# Patient Record
Sex: Female | Born: 1940
Health system: Southern US, Community
[De-identification: ages and names within clinical notes are randomized; demographics above are authoritative.]

## PROBLEM LIST (undated history)

## (undated) DIAGNOSIS — K4190 Unilateral femoral hernia, without obstruction or gangrene, not specified as recurrent: Secondary | ICD-10-CM

## (undated) DIAGNOSIS — K567 Ileus, unspecified: Secondary | ICD-10-CM

## (undated) DIAGNOSIS — M419 Scoliosis, unspecified: Secondary | ICD-10-CM

## (undated) DIAGNOSIS — E785 Hyperlipidemia, unspecified: Secondary | ICD-10-CM

## (undated) DIAGNOSIS — A419 Sepsis, unspecified organism: Secondary | ICD-10-CM

## (undated) DIAGNOSIS — R7303 Prediabetes: Secondary | ICD-10-CM

## (undated) DIAGNOSIS — E559 Vitamin D deficiency, unspecified: Secondary | ICD-10-CM

## (undated) DIAGNOSIS — K5732 Diverticulitis of large intestine without perforation or abscess without bleeding: Secondary | ICD-10-CM

## (undated) DIAGNOSIS — M1990 Unspecified osteoarthritis, unspecified site: Secondary | ICD-10-CM

## (undated) HISTORY — PX: COLONOSCOPY: SHX174

## (undated) HISTORY — DX: Hyperlipidemia, unspecified: E78.5

## (undated) HISTORY — PX: BREAST SURGERY: SHX581

## (undated) HISTORY — DX: Unspecified osteoarthritis, unspecified site: M19.90

## (undated) HISTORY — DX: Diverticulitis of large intestine without perforation or abscess without bleeding: K57.32

## (undated) HISTORY — DX: Prediabetes: R73.03

## (undated) HISTORY — DX: Scoliosis, unspecified: M41.9

## (undated) HISTORY — PX: TONSILLECTOMY AND ADENOIDECTOMY: SUR1326

## (undated) HISTORY — DX: Vitamin D deficiency, unspecified: E55.9

## (undated) HISTORY — DX: Unilateral femoral hernia, without obstruction or gangrene, not specified as recurrent: K41.90

## (undated) HISTORY — PX: TUBAL LIGATION: SHX77

---
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
## 1999-04-30 ENCOUNTER — Encounter: Payer: Self-pay | Admitting: Internal Medicine

## 1999-04-30 ENCOUNTER — Ambulatory Visit (HOSPITAL_COMMUNITY): Admission: RE | Admit: 1999-04-30 | Discharge: 1999-04-30 | Payer: Self-pay | Admitting: Internal Medicine

## 1999-05-01 ENCOUNTER — Ambulatory Visit (HOSPITAL_COMMUNITY): Admission: RE | Admit: 1999-05-01 | Discharge: 1999-05-01 | Payer: Self-pay | Admitting: Internal Medicine

## 1999-05-01 ENCOUNTER — Encounter: Payer: Self-pay | Admitting: Internal Medicine

## 1999-06-26 ENCOUNTER — Ambulatory Visit (HOSPITAL_COMMUNITY): Admission: RE | Admit: 1999-06-26 | Discharge: 1999-06-26 | Payer: Self-pay | Admitting: Unknown Physician Specialty

## 2000-02-17 ENCOUNTER — Ambulatory Visit (HOSPITAL_COMMUNITY): Admission: RE | Admit: 2000-02-17 | Discharge: 2000-02-17 | Payer: Self-pay | Admitting: Internal Medicine

## 2000-02-17 ENCOUNTER — Encounter: Payer: Self-pay | Admitting: Internal Medicine

## 2001-02-03 ENCOUNTER — Ambulatory Visit (HOSPITAL_COMMUNITY): Admission: RE | Admit: 2001-02-03 | Discharge: 2001-02-03 | Payer: Self-pay | Admitting: Internal Medicine

## 2001-02-03 ENCOUNTER — Encounter: Payer: Self-pay | Admitting: Internal Medicine

## 2001-02-21 ENCOUNTER — Encounter: Payer: Self-pay | Admitting: Neurosurgery

## 2001-02-21 ENCOUNTER — Ambulatory Visit (HOSPITAL_COMMUNITY): Admission: RE | Admit: 2001-02-21 | Discharge: 2001-02-21 | Payer: Self-pay | Admitting: Neurosurgery

## 2001-02-27 ENCOUNTER — Encounter: Payer: Self-pay | Admitting: Neurosurgery

## 2001-02-27 ENCOUNTER — Encounter: Admission: RE | Admit: 2001-02-27 | Discharge: 2001-02-27 | Payer: Self-pay | Admitting: Neurosurgery

## 2001-03-13 ENCOUNTER — Encounter: Payer: Self-pay | Admitting: Neurosurgery

## 2001-03-13 ENCOUNTER — Encounter: Admission: RE | Admit: 2001-03-13 | Discharge: 2001-03-13 | Payer: Self-pay | Admitting: Neurosurgery

## 2001-03-27 ENCOUNTER — Encounter: Admission: RE | Admit: 2001-03-27 | Discharge: 2001-03-27 | Payer: Self-pay | Admitting: Neurosurgery

## 2001-03-27 ENCOUNTER — Encounter: Payer: Self-pay | Admitting: Neurosurgery

## 2001-04-10 ENCOUNTER — Other Ambulatory Visit: Admission: RE | Admit: 2001-04-10 | Discharge: 2001-04-10 | Payer: Self-pay | Admitting: Internal Medicine

## 2001-04-14 ENCOUNTER — Encounter: Payer: Self-pay | Admitting: Internal Medicine

## 2001-04-14 ENCOUNTER — Ambulatory Visit (HOSPITAL_COMMUNITY): Admission: RE | Admit: 2001-04-14 | Discharge: 2001-04-14 | Payer: Self-pay | Admitting: Internal Medicine

## 2002-06-14 ENCOUNTER — Ambulatory Visit (HOSPITAL_COMMUNITY): Admission: RE | Admit: 2002-06-14 | Discharge: 2002-06-14 | Payer: Self-pay | Admitting: Internal Medicine

## 2002-06-14 ENCOUNTER — Encounter: Payer: Self-pay | Admitting: Internal Medicine

## 2003-08-23 ENCOUNTER — Ambulatory Visit (HOSPITAL_COMMUNITY): Admission: RE | Admit: 2003-08-23 | Discharge: 2003-08-23 | Payer: Self-pay | Admitting: Internal Medicine

## 2004-08-25 ENCOUNTER — Ambulatory Visit (HOSPITAL_COMMUNITY): Admission: RE | Admit: 2004-08-25 | Discharge: 2004-08-25 | Payer: Self-pay | Admitting: Internal Medicine

## 2005-08-26 ENCOUNTER — Other Ambulatory Visit: Admission: RE | Admit: 2005-08-26 | Discharge: 2005-08-26 | Payer: Self-pay | Admitting: Internal Medicine

## 2005-10-01 ENCOUNTER — Encounter: Admission: RE | Admit: 2005-10-01 | Discharge: 2005-10-01 | Payer: Self-pay | Admitting: Internal Medicine

## 2008-08-14 ENCOUNTER — Ambulatory Visit: Payer: Self-pay | Admitting: Internal Medicine

## 2008-08-27 ENCOUNTER — Ambulatory Visit: Payer: Self-pay | Admitting: Internal Medicine

## 2008-10-30 ENCOUNTER — Other Ambulatory Visit: Admission: RE | Admit: 2008-10-30 | Discharge: 2008-10-30 | Payer: Self-pay | Admitting: Internal Medicine

## 2009-01-27 ENCOUNTER — Ambulatory Visit (HOSPITAL_COMMUNITY): Admission: RE | Admit: 2009-01-27 | Discharge: 2009-01-27 | Payer: Self-pay | Admitting: Internal Medicine

## 2009-02-03 ENCOUNTER — Emergency Department (HOSPITAL_COMMUNITY): Admission: EM | Admit: 2009-02-03 | Discharge: 2009-02-03 | Payer: Self-pay | Admitting: Emergency Medicine

## 2009-02-12 ENCOUNTER — Ambulatory Visit: Payer: Self-pay | Admitting: Vascular Surgery

## 2009-02-12 ENCOUNTER — Encounter (INDEPENDENT_AMBULATORY_CARE_PROVIDER_SITE_OTHER): Payer: Self-pay | Admitting: Internal Medicine

## 2009-02-12 ENCOUNTER — Ambulatory Visit (HOSPITAL_COMMUNITY): Admission: RE | Admit: 2009-02-12 | Discharge: 2009-02-12 | Payer: Self-pay | Admitting: Internal Medicine

## 2010-05-28 ENCOUNTER — Encounter: Payer: Self-pay | Admitting: Cardiovascular Disease

## 2010-05-28 DIAGNOSIS — I6529 Occlusion and stenosis of unspecified carotid artery: Secondary | ICD-10-CM

## 2010-05-29 ENCOUNTER — Ambulatory Visit: Payer: Self-pay

## 2010-05-29 ENCOUNTER — Encounter: Payer: Self-pay | Admitting: Cardiovascular Disease

## 2010-11-10 NOTE — Miscellaneous (Signed)
Summary: Orders Update  Clinical Lists Changes  Problems: Added new problem of CAROTID STENOSIS (ICD-433.10) Orders: Added new Test order of Carotid Duplex (Carotid Duplex) - Signed 

## 2010-11-17 ENCOUNTER — Other Ambulatory Visit (HOSPITAL_COMMUNITY): Payer: Self-pay | Admitting: Family Medicine

## 2010-11-17 DIAGNOSIS — M1990 Unspecified osteoarthritis, unspecified site: Secondary | ICD-10-CM

## 2010-11-17 DIAGNOSIS — Z1231 Encounter for screening mammogram for malignant neoplasm of breast: Secondary | ICD-10-CM

## 2010-11-20 ENCOUNTER — Other Ambulatory Visit (HOSPITAL_COMMUNITY): Payer: Self-pay | Admitting: Internal Medicine

## 2010-11-20 DIAGNOSIS — M1990 Unspecified osteoarthritis, unspecified site: Secondary | ICD-10-CM

## 2010-11-23 ENCOUNTER — Ambulatory Visit (HOSPITAL_COMMUNITY)
Admission: RE | Admit: 2010-11-23 | Discharge: 2010-11-23 | Disposition: A | Discharge status: Home / Self Care | Payer: Medicare HMO | Source: Ambulatory Visit | Attending: Family Medicine | Admitting: Family Medicine

## 2010-11-23 DIAGNOSIS — Z13820 Encounter for screening for osteoporosis: Secondary | ICD-10-CM | POA: Insufficient documentation

## 2010-11-23 DIAGNOSIS — Z1231 Encounter for screening mammogram for malignant neoplasm of breast: Secondary | ICD-10-CM

## 2010-11-23 DIAGNOSIS — M1990 Unspecified osteoarthritis, unspecified site: Secondary | ICD-10-CM

## 2010-11-23 DIAGNOSIS — Z780 Asymptomatic menopausal state: Secondary | ICD-10-CM | POA: Insufficient documentation

## 2011-03-18 LAB — HM MAMMOGRAPHY

## 2011-12-16 DIAGNOSIS — N6019 Diffuse cystic mastopathy of unspecified breast: Secondary | ICD-10-CM | POA: Insufficient documentation

## 2013-09-16 ENCOUNTER — Encounter: Payer: Self-pay | Admitting: Physician Assistant

## 2013-09-18 ENCOUNTER — Ambulatory Visit (INDEPENDENT_AMBULATORY_CARE_PROVIDER_SITE_OTHER): Payer: Medicare HMO | Admitting: Physician Assistant

## 2013-09-18 ENCOUNTER — Encounter: Payer: Self-pay | Admitting: Physician Assistant

## 2013-09-18 VITALS — BP 110/70 | HR 82 | Temp 98.9°F | Resp 16 | Wt 103.9 lb

## 2013-09-18 DIAGNOSIS — I10 Essential (primary) hypertension: Secondary | ICD-10-CM

## 2013-09-18 DIAGNOSIS — E559 Vitamin D deficiency, unspecified: Secondary | ICD-10-CM

## 2013-09-18 DIAGNOSIS — R7309 Other abnormal glucose: Secondary | ICD-10-CM

## 2013-09-18 DIAGNOSIS — M791 Myalgia, unspecified site: Secondary | ICD-10-CM

## 2013-09-18 DIAGNOSIS — E785 Hyperlipidemia, unspecified: Secondary | ICD-10-CM

## 2013-09-18 DIAGNOSIS — R7303 Prediabetes: Secondary | ICD-10-CM

## 2013-09-18 LAB — BASIC METABOLIC PANEL WITH GFR
Calcium: 10.1 mg/dL (ref 8.4–10.5)
GFR, Est African American: >89 mL/min
Glucose, Bld: 91 mg/dL (ref 70–99)
Potassium: 4.6 mEq/L (ref 3.5–5.3)
Sodium: 139 mEq/L (ref 135–145)

## 2013-09-18 LAB — CBC WITH DIFFERENTIAL/PLATELET
Basophils Absolute: 0 10*3/uL (ref 0.0–0.1)
Basophils Relative: 1 % (ref 0–1)
Eosinophils Absolute: 0.2 10*3/uL (ref 0.0–0.7)
Eosinophils Relative: 6 % — ABNORMAL HIGH (ref 0–5)
HCT: 39.4 % (ref 36.0–46.0)
Lymphocytes Relative: 36 % (ref 12–46)
MCH: 29.1 pg (ref 26.0–34.0)
MCHC: 32.5 g/dL (ref 30.0–36.0)
MCV: 89.5 fL (ref 78.0–100.0)
Monocytes Absolute: 0.4 10*3/uL (ref 0.1–1.0)
Platelets: 282 10*3/uL (ref 150–400)
RDW: 13.8 % (ref 11.5–15.5)
WBC: 4 10*3/uL (ref 4.0–10.5)

## 2013-09-18 LAB — LIPID PANEL
Cholesterol: 221 mg/dL — ABNORMAL HIGH (ref 0–200)
Triglycerides: 49 mg/dL (ref <150)
VLDL: 10 mg/dL (ref 0–40)

## 2013-09-18 LAB — HEPATIC FUNCTION PANEL
ALT: 15 U/L (ref 0–35)
Albumin: 4.7 g/dL (ref 3.5–5.2)
Bilirubin, Direct: 0.1 mg/dL (ref 0.0–0.3)
Indirect Bilirubin: 0.3 mg/dL (ref 0.0–0.9)
Total Bilirubin: 0.4 mg/dL (ref 0.3–1.2)

## 2013-09-18 LAB — HEMOGLOBIN A1C
Hgb A1c MFr Bld: 5.6 % (ref <5.7)
Mean Plasma Glucose: 114 mg/dL (ref <117)

## 2013-09-18 LAB — CK: Total CK: 69 U/L (ref 7–177)

## 2013-09-18 NOTE — Patient Instructions (Signed)
Fat and Cholesterol Control Diet  Fat and cholesterol levels in your blood and organs are influenced by your diet. High levels of fat and cholesterol may lead to diseases of the heart, small and large blood vessels, gallbladder, liver, and pancreas.  CONTROLLING FAT AND CHOLESTEROL WITH DIET  Although exercise and lifestyle factors are important, your diet is key. That is because certain foods are known to raise cholesterol and others to lower it. The goal is to balance foods for their effect on cholesterol and more importantly, to replace saturated and trans fat with other types of fat, such as monounsaturated fat, polyunsaturated fat, and omega-3 fatty acids.  On average, a person should consume no more than 15 to 17 g of saturated fat daily. Saturated and trans fats are considered "bad" fats, and they will raise LDL cholesterol. Saturated fats are primarily found in animal products such as meats, butter, and cream. However, that does not mean you need to give up all your favorite foods. Today, there are good tasting, low-fat, low-cholesterol substitutes for most of the things you like to eat. Choose low-fat or nonfat alternatives. Choose round or loin cuts of red meat. These types of cuts are lowest in fat and cholesterol. Chicken (without the skin), fish, veal, and ground turkey breast are great choices. Eliminate fatty meats, such as hot dogs and salami. Even shellfish have little or no saturated fat. Have a 3 oz (85 g) portion when you eat lean meat, poultry, or fish.  Trans fats are also called "partially hydrogenated oils." They are oils that have been scientifically manipulated so that they are solid at room temperature resulting in a longer shelf life and improved taste and texture of foods in which they are added. Trans fats are found in stick margarine, some tub margarines, cookies, crackers, and baked goods.   When baking and cooking, oils are a great substitute for butter. The monounsaturated oils are  especially beneficial since it is believed they lower LDL and raise HDL. The oils you should avoid entirely are saturated tropical oils, such as coconut and palm.   Remember to eat a lot from food groups that are naturally free of saturated and trans fat, including fish, fruit, vegetables, beans, grains (barley, rice, couscous, bulgur wheat), and pasta (without cream sauces).   IDENTIFYING FOODS THAT LOWER FAT AND CHOLESTEROL   Soluble fiber may lower your cholesterol. This type of fiber is found in fruits such as apples, vegetables such as broccoli, potatoes, and carrots, legumes such as beans, peas, and lentils, and grains such as barley. Foods fortified with plant sterols (phytosterol) may also lower cholesterol. You should eat at least 2 g per day of these foods for a cholesterol lowering effect.   Read package labels to identify low-saturated fats, trans fat free, and low-fat foods at the supermarket. Select cheeses that have only 2 to 3 g saturated fat per ounce. Use a heart-healthy tub margarine that is free of trans fats or partially hydrogenated oil. When buying baked goods (cookies, crackers), avoid partially hydrogenated oils. Breads and muffins should be made from whole grains (whole-wheat or whole oat flour, instead of "flour" or "enriched flour"). Buy non-creamy canned soups with reduced salt and no added fats.   FOOD PREPARATION TECHNIQUES   Never deep-fry. If you must fry, either stir-fry, which uses very little fat, or use non-stick cooking sprays. When possible, broil, bake, or roast meats, and steam vegetables. Instead of putting butter or margarine on vegetables, use lemon   and herbs, applesauce, and cinnamon (for squash and sweet potatoes). Use nonfat yogurt, salsa, and low-fat dressings for salads.   LOW-SATURATED FAT / LOW-FAT FOOD SUBSTITUTES  Meats / Saturated Fat (g)  · Avoid: Steak, marbled (3 oz/85 g) / 11 g  · Choose: Steak, lean (3 oz/85 g) / 4 g  · Avoid: Hamburger (3 oz/85 g) / 7  g  · Choose: Hamburger, lean (3 oz/85 g) / 5 g  · Avoid: Ham (3 oz/85 g) / 6 g  · Choose: Ham, lean cut (3 oz/85 g) / 2.4 g  · Avoid: Chicken, with skin, dark meat (3 oz/85 g) / 4 g  · Choose: Chicken, skin removed, dark meat (3 oz/85 g) / 2 g  · Avoid: Chicken, with skin, light meat (3 oz/85 g) / 2.5 g  · Choose: Chicken, skin removed, light meat (3 oz/85 g) / 1 g  Dairy / Saturated Fat (g)  · Avoid: Whole milk (1 cup) / 5 g  · Choose: Low-fat milk, 2% (1 cup) / 3 g  · Choose: Low-fat milk, 1% (1 cup) / 1.5 g  · Choose: Skim milk (1 cup) / 0.3 g  · Avoid: Hard cheese (1 oz/28 g) / 6 g  · Choose: Skim milk cheese (1 oz/28 g) / 2 to 3 g  · Avoid: Cottage cheese, 4% fat (1 cup) / 6.5 g  · Choose: Low-fat cottage cheese, 1% fat (1 cup) / 1.5 g  · Avoid: Ice cream (1 cup) / 9 g  · Choose: Sherbet (1 cup) / 2.5 g  · Choose: Nonfat frozen yogurt (1 cup) / 0.3 g  · Choose: Frozen fruit bar / trace  · Avoid: Whipped cream (1 tbs) / 3.5 g  · Choose: Nondairy whipped topping (1 tbs) / 1 g  Condiments / Saturated Fat (g)  · Avoid: Mayonnaise (1 tbs) / 2 g  · Choose: Low-fat mayonnaise (1 tbs) / 1 g  · Avoid: Butter (1 tbs) / 7 g  · Choose: Extra light margarine (1 tbs) / 1 g  · Avoid: Coconut oil (1 tbs) / 11.8 g  · Choose: Olive oil (1 tbs) / 1.8 g  · Choose: Corn oil (1 tbs) / 1.7 g  · Choose: Safflower oil (1 tbs) / 1.2 g  · Choose: Sunflower oil (1 tbs) / 1.4 g  · Choose: Soybean oil (1 tbs) / 2.4 g  · Choose: Canola oil (1 tbs) / 1 g  Document Released: 09/27/2005 Document Revised: 01/22/2013 Document Reviewed: 03/18/2011  [employer]® Patient Information ©2014 [employer].

## 2013-09-18 NOTE — Progress Notes (Signed)
HPI Patient presents for 3 month follow up with hypertension, hyperlipidemia, prediabetes and vitamin D. Patient's blood pressure has been controlled at home. Patient denies chest pain, shortness of breath, dizziness.  Patient's cholesterol is diet controlled. The cholesterol last visit was LDL of 53 however she was on pravastatin at that time and is no longer one it due to myalgias. The patient has been working on diet and exercise for prediabetes, and denies changes in vision, polys, and paresthesias. A1C 5.9 Patient is on Vitamin D supplement.  Current Medications:  Current Outpatient Prescriptions on File Prior to Visit  Medication Sig Dispense Refill  . ALPRAZolam (XANAX XR) 0.5 MG 24 hr tablet Take 0.5 mg by mouth daily.      . Biotin 1 MG CAPS Take by mouth.      . cholecalciferol (VITAMIN D) 1000 UNITS tablet Take 6,000 Units by mouth daily.      Marland Kitchen co-enzyme Q-10 30 MG capsule Take 30 mg by mouth 3 (three) times daily.      Marland Kitchen aspirin 81 MG tablet Take 81 mg by mouth daily.      . pravastatin (PRAVACHOL) 40 MG tablet Take 40 mg by mouth daily.       No current facility-administered medications on file prior to visit.   Medical History:  Past Medical History  Diagnosis Date  . Hypertension   . Hyperlipidemia   . Arthritis   . Prediabetes   . Scoliosis   . Vitamin D deficiency    Allergies:  Allergies  Allergen Reactions  . Betadine [Povidone Iodine]   . Ciprocinonide [Fluocinolone]   . Flexeril [Cyclobenzaprine]   . Ibuprofen   . Naprosyn [Naproxen]   . Penicillins     REACTION: rash  . Shellfish Allergy   . Sodium Benzoate [Nutritional Supplements]   . Zyrtec [Cetirizine]     ROS Constitutional: Denies fever, chills, weight loss/gain, headaches, insomnia, fatigue, night sweats, and change in appetite. Eyes: Denies redness, blurred vision, diplopia, discharge, itchy, watery eyes.  ENT: Denies discharge, congestion, post nasal drip, sore throat, earache, dental pain,  Tinnitus, Vertigo, Sinus pain, snoring.  Cardio: Denies chest pain, palpitations, irregular heartbeat,  dyspnea, diaphoresis, orthopnea, PND, claudication, edema Respiratory: denies cough, dyspnea,pleurisy, hoarseness, wheezing.  Gastrointestinal: Denies dysphagia, heartburn,  water brash, pain, cramps, nausea, vomiting, bloating, diarrhea, constipation, hematemesis, melena, hematochezia,  hemorrhoids Genitourinary: Denies dysuria, frequency, urgency, nocturia, hesitancy, discharge, hematuria, flank pain Musculoskeletal: + myalgia mainly at night waking her up in the night, better since off statin. Denies arthralgia, stiffness, Jt. Swelling, pain, limp, and strain/sprain. Skin: Denies pruritis, rash, hives, warts, acne, eczema, changing in skin lesion Neuro: Denies Weakness, tremor, incoordination, spasms, paresthesia, pain Psychiatric: Denies confusion, memory loss, sensory loss Endocrine: Denies change in weight, skin, hair change, nocturia, and paresthesia, Diabetic Polys, Denies visual blurring, hyper /hypo glycemic episodes.  Heme/Lymph: Denies Excessive bleeding, bruising, enlarged lymph nodes  Family history- Review and unchanged Social history- Review and unchanged Physical Exam: Filed Vitals:   09/18/13 1400  BP: 110/70  Pulse: 82  Temp: 98.9 F (37.2 C)  Resp: 16   Filed Weights   09/18/13 1400  Weight: 103 lb 14.4 oz (47.129 kg)   General Appearance: Well nourished, in no apparent distress. Eyes: PERRLA, EOMs, conjunctiva no swelling or erythema, normal fundi and vessels. Sinuses: No Frontal/maxillary tenderness ENT/Mouth: Ext aud canals clear, with TMs without erythema, bulging.No erythema, swelling, or exudate on post pharynx.  Tonsils not swollen or erythematous. Hearing normal.  Neck: Supple, thyroid normal.  Respiratory: Respiratory effort normal, BS equal bilaterally without rales, rhonchi, wheezing or stridor.  Cardio: Heart sounds normal, regular rate and rhythm  without murmurs, rubs or gallops. Peripheral pulses brisk and equal bilaterally, without edema.  Abdomen: Flat, soft, with bowel sounds. Non tender, no guarding, rebound, hernias, masses, or organomegaly.  Lymphatics: Non tender without lymphadenopathy.  Musculoskeletal: Full ROM all peripheral extremities, joint stability, 5/5 strength, and normal gait. Skin: Warm, dry without rashes, lesions, ecchymosis.  Neuro: Cranial nerves intact, reflexes equal bilaterally. Normal muscle tone, no cerebellar symptoms. Sensation intact.  Psych: Awake and oriented X 3, normal affect, Insight and Judgment appropriate.   Assessment and Plan:  Hypertension: Continue medication, monitor blood pressure at home. Continue DASH diet. Cholesterol: Continue diet and exercise. Check cholesterol since off of statin Myalgia- check CPK and labs Pre-diabetes-Continue diet and exercise. Check A1C Vitamin D Def- check level and continue medications.   Quentin Mulling 2:16 PM

## 2013-09-19 LAB — VITAMIN D 25 HYDROXY (VIT D DEFICIENCY, FRACTURES): Vit D, 25-Hydroxy: 71 ng/mL (ref 30–89)

## 2013-10-15 ENCOUNTER — Ambulatory Visit (INDEPENDENT_AMBULATORY_CARE_PROVIDER_SITE_OTHER): Payer: Medicare HMO

## 2013-10-15 ENCOUNTER — Other Ambulatory Visit: Payer: Self-pay

## 2013-10-15 VITALS — BP 102/60 | HR 84 | Temp 98.3°F | Resp 16 | Ht 64.0 in | Wt 103.0 lb

## 2013-10-15 DIAGNOSIS — N3000 Acute cystitis without hematuria: Secondary | ICD-10-CM

## 2013-10-15 MED ORDER — NITROFURANTOIN MONOHYD MACRO 100 MG PO CAPS: 100.0000 mg | ORAL_CAPSULE | Freq: Two times a day (BID) | ORAL | Status: DC

## 2013-10-15 NOTE — Progress Notes (Signed)
Patient ID: Shelly Barrett, female   DOB: January 30, 1941, 73 y.o.   MRN: [id number] Patient here today with complaints of UTI symptoms.

## 2013-10-16 LAB — URINALYSIS, MICROSCOPIC ONLY
BACTERIA UA: NONE SEEN
CASTS: NONE SEEN
Squamous Epithelial / LPF: NONE SEEN

## 2013-10-16 LAB — URINALYSIS, ROUTINE W REFLEX MICROSCOPIC
Bilirubin Urine: NEGATIVE
Glucose, UA: 100 mg/dL — AB
NITRITE: POSITIVE — AB
PH: 5 (ref 5.0–8.0)
Protein, ur: NEGATIVE mg/dL
Specific Gravity, Urine: 1.018 (ref 1.005–1.030)
UROBILINOGEN UA: 1 mg/dL (ref 0.0–1.0)

## 2013-10-17 LAB — URINE CULTURE

## 2013-10-17 NOTE — Progress Notes (Signed)
Pt already has a appt 2/15 for cpe so will recheck urine then

## 2013-11-25 ENCOUNTER — Encounter: Payer: Self-pay | Admitting: Internal Medicine

## 2013-11-25 DIAGNOSIS — Z79899 Other long term (current) drug therapy: Secondary | ICD-10-CM | POA: Insufficient documentation

## 2013-11-25 NOTE — Patient Instructions (Signed)

## 2013-11-25 NOTE — Progress Notes (Signed)
Patient ID: Shelly Barrett, female   DOB: Aug 27, 1941, 73 y.o.   MRN: [id number]   Annual Screening Comprehensive Examination  This very nice 73 y.o. MWF presents for complete physical.  Patient has been followed for Labile HTN, Prediabetes, Hyperlipidemia, and Vitamin D Deficiency.    HTN predates since 2012 and has been monitored expectantly. Today's BP: 132/78 mmHg. Patient denies any cardiac symptoms as chest pain, palpitations, shortness of breath, dizziness or ankle swelling.   Patient's hyperlipidemia is controlled with diet but she stopped her Pravastatin about 6 months ago for concern that statins cause memory problems and dementia (as she is dealing with moderate dementia in her husband). Patient denied any myalgias or other medication SE's when taking the Pravastatin.  Lab Results  Component Value Date   CHOL 221* 09/18/2013   HDL 106 09/18/2013   LDLCALC 105* 09/18/2013   TRIG 49 09/18/2013   CHOLHDL 2.1 09/18/2013    Patient has prediabetes/insulin resistance with A1c 6.1% predating since Nov 2012 with last A1c of 5.9% in Sept 2014. Patient denies reactive hypoglycemic symptoms, visual blurring, diabetic polys, or paresthesias.    She does have DJD and DDD and moderately severe thoracolumbar scoliosis. In 2002 she had EDSI for L4L5 HNP and since then routinely sees a chiropractor for adjustments.   Finally, patient has history of Vitamin D Deficiency of 24 in 2008 with last vitamin D of 82 in Sept 2014.   Medication List         ALPRAZolam 0.5 MG 24 hr tablet      (takes rarely - only 1 or 2 X in the last year)  Commonly known as:  XANAX XR  Take 0.5 mg by mouth daily.     aspirin 81 MG tablet  Take 81 mg by mouth daily.     Biotin 1 MG Caps  Take by mouth.     cholecalciferol 1000 UNITS tablet  Commonly known as:  VITAMIN D  Take 6,000 Units by mouth daily.        OVER THE COUNTER MEDICATION  Prevagen (memory) 1 a day        Allergies  Allergen Reactions  .  Betadine [Povidone Iodine]   . Ciprocinonide [Fluocinolone]   . Flexeril [Cyclobenzaprine]   . Ibuprofen   . Naprosyn [Naproxen]   . Penicillins     REACTION: rash  . Shellfish Allergy   . Sodium Benzoate [Nutritional Supplements]   . Zyrtec [Cetirizine]     Past Medical History  Diagnosis Date  . Hyperlipidemia   . Arthritis   . Prediabetes   . Scoliosis   . Vitamin D deficiency     Past Surgical History  Procedure Laterality Date  . Breast surgery  1980, 2012    implants  . Tubal ligation    . Tonsillectomy and adenoidectomy      Family History  Problem Relation Age of Onset  . Heart disease Mother   . Hypertension Mother   . Heart disease Father   . Cancer Father     thoat and prostate  . Arthritis Sister     RA  . Hyperlipidemia Brother     History  Substance Use Topics  . Smoking status: Never Smoker   . Smokeless tobacco: Never Used  . Alcohol Use: 0.5 oz/week    1 drink(s) per week     Comment: occasionally    ROS Constitutional: Denies fever, chills, weight loss/gain, headaches, insomnia, fatigue, night sweats, and change in  appetite. Eyes: Denies redness, blurred vision, diplopia, discharge, itchy, watery eyes.  ENT: Denies discharge, congestion, post nasal drip, epistaxis, sore throat, earache, hearing loss, dental pain, Tinnitus, Vertigo, Sinus pain, snoring.  Cardio: Denies chest pain, palpitations, irregular heartbeat, syncope, dyspnea, diaphoresis, orthopnea, PND, claudication, edema Respiratory: denies cough, dyspnea, DOE, pleurisy, hoarseness, laryngitis, wheezing.  Gastrointestinal: Denies dysphagia, heartburn, reflux, water brash, pain, cramps, nausea, vomiting, bloating, diarrhea, constipation, hematemesis, melena, hematochezia, jaundice, hemorrhoids Genitourinary: Denies dysuria, frequency, urgency, nocturia, hesitancy, discharge, hematuria, flank pain Breast:Breast lumps, nipple discharge, bleeding.  Musculoskeletal: Denies arthralgia, ,  stiffness, Jt. Swelling, limp, and strain/sprain.Does c/o chronic LBP w/o sciatica.Generalized decrease in muscle power, tone and bulk consistent with age and deconditioning. Skin: Denies puritis, rash, hives, warts, acne, eczema, changing in skin lesion Neuro: No weakness, tremor, incoordination, spasms, paresthesia, pain Psychiatric: Denies confusion, memory loss, sensory loss Endocrine: Denies change in weight, skin, hair change, nocturia, and paresthesia, diabetic polys, visual blurring, hyper / hypo glycemic episodes.  Heme/Lymph: No excessive bleeding, bruising, enlarged lymph nodes.  BP: 132/78  Pulse: 76  Temp: 97.7 F (36.5 C)  Resp: 16    Estimated body mass index is 17.67 kg/(m^2) as calculated from the following:   Height as of this encounter: 5\' 4"  (1.626 m).   Weight as of this encounter: 103 lb (46.72 kg).  Physical Exam General Appearance: Well nourished, in no apparent distress. Eyes: PERRLA, EOMs, conjunctiva no swelling or erythema, normal fundi and vessels. Sinuses: No frontal/maxillary tenderness ENT/Mouth: EACs patent / TMs  nl. Nares clear without erythema, swelling, mucoid exudates. Oral hygiene is good. No erythema, swelling, or exudate. Tongue normal, non-obstructing. Tonsils not swollen or erythematous. Hearing normal.  Neck: Supple, thyroid normal. No bruits, nodes or JVD. Respiratory: Respiratory effort normal.  BS equal and clear bilateral without rales, rhonci, wheezing or stridor. Cardio: Heart sounds are normal with regular rate and rhythm and no murmurs, rubs or gallops. Peripheral pulses are normal and equal bilaterally without edema. No aortic or femoral bruits. Chest: Assymmetric with moderate S- shaped thoracolumbar scoliosis Breasts: Symmetric, without lumps, nipple discharge, retractions, or fibrocystic changes.  Abdomen: Flat, soft, with bowl sounds. Nontender, no guarding, rebound, hernias, masses, or organomegaly.  Lymphatics: Non tender without  lymphadenopathy.  Genitourinary:  Musculoskeletal: Full ROM all peripheral extremities, joint stability, 5/5 strength, and normal gait. Skin: Warm and dry without rashes, lesions, cyanosis, clubbing or  ecchymosis.  Neuro: Cranial nerves intact, reflexes equal bilaterally. Normal muscle tone, no cerebellar symptoms. Sensation intact.  Pysch: Awake and oriented X 3, normal affect, Insight and Judgment appropriate.   Assessment and Plan  1. Annual Screening Examination 2. Hypertension, Labile 3. Hyperlipidemia 4. Pre Diabetes 5. Vitamin D Deficiency 6. DJD/DDD/Chronic Lumbago 7. Thoracolumbar Scoliosis  Continue prudent diet as discussed, weight control, BP monitoring, regular exercise, and medications. Discussed med's effects and SE's. Screening labs and tests as requested with regular follow-up as recommended.

## 2013-11-26 ENCOUNTER — Ambulatory Visit (INDEPENDENT_AMBULATORY_CARE_PROVIDER_SITE_OTHER): Payer: Medicare HMO | Admitting: Internal Medicine

## 2013-11-26 ENCOUNTER — Encounter: Payer: Self-pay | Admitting: Internal Medicine

## 2013-11-26 VITALS — BP 132/78 | HR 76 | Temp 97.7°F | Resp 16 | Ht 64.0 in | Wt 103.0 lb

## 2013-11-26 DIAGNOSIS — E559 Vitamin D deficiency, unspecified: Secondary | ICD-10-CM

## 2013-11-26 DIAGNOSIS — R7303 Prediabetes: Secondary | ICD-10-CM

## 2013-11-26 DIAGNOSIS — E60 Dietary zinc deficiency: Secondary | ICD-10-CM

## 2013-11-26 DIAGNOSIS — E785 Hyperlipidemia, unspecified: Secondary | ICD-10-CM

## 2013-11-26 DIAGNOSIS — Z0000 Encounter for general adult medical examination without abnormal findings: Secondary | ICD-10-CM

## 2013-11-26 DIAGNOSIS — I10 Essential (primary) hypertension: Secondary | ICD-10-CM

## 2013-11-26 DIAGNOSIS — D649 Anemia, unspecified: Secondary | ICD-10-CM

## 2013-11-26 DIAGNOSIS — Z79899 Other long term (current) drug therapy: Secondary | ICD-10-CM

## 2013-11-26 DIAGNOSIS — Z1212 Encounter for screening for malignant neoplasm of rectum: Secondary | ICD-10-CM

## 2013-11-26 LAB — CBC WITH DIFFERENTIAL/PLATELET
Basophils Absolute: 0 K/uL (ref 0.0–0.1)
Basophils Relative: 1 % (ref 0–1)
Eosinophils Absolute: 0.1 K/uL (ref 0.0–0.7)
Eosinophils Relative: 4 % (ref 0–5)
HCT: 40.7 % (ref 36.0–46.0)
Hemoglobin: 13.4 g/dL (ref 12.0–15.0)
Lymphocytes Relative: 46 % (ref 12–46)
Lymphs Abs: 1.3 K/uL (ref 0.7–4.0)
MCH: 29.1 pg (ref 26.0–34.0)
MCHC: 32.9 g/dL (ref 30.0–36.0)
MCV: 88.3 fL (ref 78.0–100.0)
Monocytes Absolute: 0.3 K/uL (ref 0.1–1.0)
Monocytes Relative: 9 % (ref 3–12)
Neutro Abs: 1.1 K/uL (ref 1.7–7.7)
Neutrophils Relative %: 40 % — ABNORMAL LOW (ref 43–77)
Platelets: 274 K/uL (ref 150–400)
RBC: 4.61 MIL/uL (ref 3.87–5.11)
RDW: 14.5 % (ref 11.5–15.5)
WBC: 2.8 K/uL — ABNORMAL LOW (ref 4.0–10.5)

## 2013-11-26 LAB — HEMOGLOBIN A1C
HEMOGLOBIN A1C: 5.6 % (ref <5.7)
Mean Plasma Glucose: 114 mg/dL (ref <117)

## 2013-11-27 LAB — MAGNESIUM: MAGNESIUM: 2.2 mg/dL (ref 1.5–2.5)

## 2013-11-27 LAB — HEPATIC FUNCTION PANEL
ALBUMIN: 5.3 g/dL — AB (ref 3.5–5.2)
ALK PHOS: 91 U/L (ref 39–117)
ALT: 13 U/L (ref 0–35)
AST: 21 U/L (ref 0–37)
Bilirubin, Direct: 0.1 mg/dL (ref 0.0–0.3)
Indirect Bilirubin: 0.5 mg/dL (ref 0.2–1.2)
TOTAL PROTEIN: 7.5 g/dL (ref 6.0–8.3)
Total Bilirubin: 0.6 mg/dL (ref 0.2–1.2)

## 2013-11-27 LAB — BASIC METABOLIC PANEL WITH GFR
BUN: 11 mg/dL (ref 6–23)
CALCIUM: 9.8 mg/dL (ref 8.4–10.5)
CO2: 27 mEq/L (ref 19–32)
CREATININE: 0.44 mg/dL — AB (ref 0.50–1.10)
Chloride: 101 mEq/L (ref 96–112)
GFR, Est Non African American: >89 mL/min
Glucose, Bld: 91 mg/dL (ref 70–99)
Potassium: 3.9 mEq/L (ref 3.5–5.3)
SODIUM: 139 meq/L (ref 135–145)

## 2013-11-27 LAB — IRON AND TIBC
%SAT: 28 % (ref 20–55)
IRON: 122 ug/dL (ref 42–145)
TIBC: 434 ug/dL (ref 250–470)
UIBC: 312 ug/dL (ref 125–400)

## 2013-11-27 LAB — TSH: TSH: 1.279 u[IU]/mL (ref 0.350–4.500)

## 2013-11-27 LAB — URINALYSIS, MICROSCOPIC ONLY
Bacteria, UA: NONE SEEN
Casts: NONE SEEN
Squamous Epithelial / LPF: NONE SEEN

## 2013-11-27 LAB — MICROALBUMIN / CREATININE URINE RATIO
Creatinine, Urine: 47.3 mg/dL
MICROALB UR: 2.6 mg/dL — AB (ref 0.00–1.89)
MICROALB/CREAT RATIO: 55 mg/g — AB (ref 0.0–30.0)

## 2013-11-27 LAB — INSULIN, FASTING: Insulin fasting, serum: 4 u[IU]/mL (ref 3–28)

## 2013-11-27 LAB — VITAMIN D 25 HYDROXY (VIT D DEFICIENCY, FRACTURES): Vit D, 25-Hydroxy: 90 ng/mL — ABNORMAL HIGH (ref 30–89)

## 2013-11-27 LAB — LIPID PANEL
CHOLESTEROL: 221 mg/dL — AB (ref 0–200)
HDL: 100 mg/dL (ref >39)
LDL Cholesterol: 108 mg/dL — ABNORMAL HIGH (ref 0–99)
Total CHOL/HDL Ratio: 2.2 Ratio
Triglycerides: 64 mg/dL (ref <150)
VLDL: 13 mg/dL (ref 0–40)

## 2013-11-28 LAB — ZINC: Zinc: 81 ug/dL (ref 60–130)

## 2014-01-07 ENCOUNTER — Encounter: Payer: Self-pay | Admitting: Physician Assistant

## 2014-01-07 ENCOUNTER — Ambulatory Visit (INDEPENDENT_AMBULATORY_CARE_PROVIDER_SITE_OTHER): Payer: Commercial Managed Care - HMO | Admitting: Physician Assistant

## 2014-01-07 VITALS — BP 118/60 | HR 80 | Temp 99.5°F | Resp 16 | Wt 104.0 lb

## 2014-01-07 DIAGNOSIS — J0100 Acute maxillary sinusitis, unspecified: Secondary | ICD-10-CM

## 2014-01-07 MED ORDER — AZITHROMYCIN 250 MG PO TABS: ORAL_TABLET | ORAL | Status: DC

## 2014-01-07 MED ORDER — AZITHROMYCIN 250 MG PO TABS
ORAL_TABLET | ORAL | Status: DC
Start: 2014-01-07 — End: 2014-01-07

## 2014-01-07 NOTE — Progress Notes (Signed)
   Subjective:    Patient ID: Shelly Barrett, female    DOB: 01/20/1941, 73 y.o.   MRN: [id number]  Cough This is a recurrent problem. Episode onset: 2-3 weeks. The problem has been unchanged. The problem occurs constantly. Associated symptoms include chills, a fever, headaches, myalgias, nasal congestion, postnasal drip, rhinorrhea, a sore throat, shortness of breath and wheezing. Pertinent negatives include no chest pain, ear congestion, ear pain, heartburn, hemoptysis, rash, sweats or weight loss. She has tried rest (tylenol) for the symptoms. The treatment provided no relief.    Review of Systems  Constitutional: Positive for fever and chills. Negative for weight loss, diaphoresis, activity change and appetite change.  HENT: Positive for congestion, postnasal drip, rhinorrhea, sinus pressure and sore throat. Negative for ear pain, sneezing, tinnitus, trouble swallowing and voice change.   Eyes: Negative.   Respiratory: Positive for cough, chest tightness, shortness of breath and wheezing. Negative for hemoptysis.   Cardiovascular: Negative for chest pain, palpitations and leg swelling.  Gastrointestinal: Negative for heartburn.  Musculoskeletal: Positive for myalgias.  Skin: Negative for rash.  Neurological: Positive for headaches.       Objective:   Physical Exam  Constitutional: She appears well-developed and well-nourished.  HENT:  Head: Normocephalic and atraumatic.  Right Ear: External ear normal.  Left Ear: External ear normal.  Mouth/Throat: Uvula is midline and mucous membranes are normal. Posterior oropharyngeal edema and posterior oropharyngeal erythema present.  Eyes: Conjunctivae and EOM are normal. Pupils are equal, round, and reactive to light.  Neck: Normal range of motion. Neck supple.  Cardiovascular: Normal rate, regular rhythm and normal heart sounds.   Pulmonary/Chest: Effort normal and breath sounds normal.  Abdominal: Soft. Bowel sounds are normal.   Lymphadenopathy:    She has cervical adenopathy.  Skin: Skin is warm and dry.      Assessment & Plan:  Acute maxillary sinusitis - Plan: azithromycin (ZITHROMAX) 250 MG tablet, DISCONTINUED: azithromycin (ZITHROMAX) 250 MG tablet

## 2014-01-07 NOTE — Patient Instructions (Signed)

## 2014-03-07 ENCOUNTER — Ambulatory Visit (INDEPENDENT_AMBULATORY_CARE_PROVIDER_SITE_OTHER): Payer: Commercial Managed Care - HMO | Admitting: Internal Medicine

## 2014-03-07 ENCOUNTER — Encounter: Payer: Self-pay | Admitting: Internal Medicine

## 2014-03-07 VITALS — BP 122/72 | HR 88 | Temp 97.3°F | Resp 16 | Ht 64.75 in | Wt 105.2 lb

## 2014-03-07 DIAGNOSIS — S93409A Sprain of unspecified ligament of unspecified ankle, initial encounter: Secondary | ICD-10-CM

## 2014-03-07 NOTE — Patient Instructions (Signed)
Ankle Sprain An ankle sprain is an injury to the strong, fibrous tissues (ligaments) that hold the bones of your ankle joint together.  CAUSES An ankle sprain is usually caused by a fall or by twisting your ankle. Ankle sprains most commonly occur when you step on the outer edge of your foot, and your ankle turns inward. People who participate in sports are more prone to these types of injuries.  SYMPTOMS   Pain in your ankle. The pain may be present at rest or only when you are trying to stand or walk.  Swelling.  Bruising. Bruising may develop immediately or within 1 to 2 days after your injury.  Difficulty standing or walking, particularly when turning corners or changing directions. DIAGNOSIS  Your caregiver will ask you details about your injury and perform a physical exam of your ankle to determine if you have an ankle sprain. During the physical exam, your caregiver will press on and apply pressure to specific areas of your foot and ankle. Your caregiver will try to move your ankle in certain ways. An X-ray exam may be done to be sure a bone was not broken or a ligament did not separate from one of the bones in your ankle (avulsion fracture).  TREATMENT  Certain types of braces can help stabilize your ankle. Your caregiver can make a recommendation for this. Your caregiver may recommend the use of medicine for pain. If your sprain is severe, your caregiver may refer you to a surgeon who helps to restore function to parts of your skeletal system (orthopedist) or a physical therapist. HOME CARE INSTRUCTIONS   Apply ice to your injury for 1 2 days or as directed by your caregiver. Applying ice helps to reduce inflammation and pain.  Put ice in a plastic bag.  Place a towel between your skin and the bag.  Leave the ice on for 15-20 minutes at a time, every 2 hours while you are awake.  Only take over-the-counter or prescription medicines for pain, discomfort, or fever as directed by  your caregiver.  Elevate your injured ankle above the level of your heart as much as possible for 2 3 days.  If your caregiver recommends crutches, use them as instructed. Gradually put weight on the affected ankle. Continue to use crutches or a cane until you can walk without feeling pain in your ankle.  If you have a plaster splint, wear the splint as directed by your caregiver. Do not rest it on anything harder than a pillow for the first 24 hours. Do not put weight on it. Do not get it wet. You may take it off to take a shower or bath.  You may have been given an elastic bandage to wear around your ankle to provide support. If the elastic bandage is too tight (you have numbness or tingling in your foot or your foot becomes cold and blue), adjust the bandage to make it comfortable.  If you have an air splint, you may blow more air into it or let air out to make it more comfortable. You may take your splint off at night and before taking a shower or bath. Wiggle your toes in the splint several times per day to decrease swelling. SEEK MEDICAL CARE IF:   You have rapidly increasing bruising or swelling.  Your toes feel extremely cold or you lose feeling in your foot.  Your pain is not relieved with medicine. SEEK IMMEDIATE MEDICAL CARE IF:  Your toes are numb   or blue.  You have severe pain that is increasing. MAKE SURE YOU:   Understand these instructions.  Will watch your condition.  Will get help right away if you are not doing well or get worse. Document Released: 09/27/2005 Document Revised: 06/21/2012 Document Reviewed: 10/09/2011 [facility] Patient Information 2014 [place], [place].   Acute Ankle Sprain with Phase I Rehab An acute ankle sprain is a partial or complete tear in one or more of the ligaments of the ankle due to traumatic injury. The severity of the injury depends on both the the number of ligaments sprained and the grade of sprain. There are 3 grades of sprains.     A grade 1 sprain is a mild sprain. There is a slight pull without obvious tearing. There is no loss of strength, and the muscle and ligament are the correct length.  A grade 2 sprain is a moderate sprain. There is tearing of fibers within the substance of the ligament where it connects two bones or two cartilages. The length of the ligament is increased, and there is usually decreased strength.  A grade 3 sprain is a complete rupture of the ligament and is uncommon. In addition to the grade of sprain, there are three types of ankle sprains.  Lateral ankle sprains: This is a sprain of one or more of the three ligaments on the outer side (lateral) of the ankle. These are the most common sprains. Medial ankle sprains: There is one large triangular ligament of the inner side (medial) of the ankle that is susceptible to injury. Medial ankle sprains are less common. Syndesmosis, "high ankle," sprains: The syndesmosis is the ligament that connects the two bones of the lower leg. Syndesmosis sprains usually only occur with very severe ankle sprains. SYMPTOMS  Pain, tenderness, and swelling in the ankle, starting at the side of injury that may progress to the whole ankle and foot with time.  "Pop" or tearing sensation at the time of injury.  Bruising that may spread to the heel.  Impaired ability to walk soon after injury. CAUSES   Acute ankle sprains are caused by trauma placed on the ankle that temporarily forces or pries the anklebone (talus) out of its normal socket.  Stretching or tearing of the ligaments that normally hold the joint in place (usually due to a twisting injury). RISK INCREASES WITH:  Previous ankle sprain.  Sports in which the foot may land awkwardly (ie. basketball, volleyball, or soccer) or walking or running on uneven or rough surfaces.  Shoes with inadequate support to prevent sideways motion when stress occurs.  Poor strength and flexibility.  Poor balance  skills.  Contact sports. PREVENTION   Warm up and stretch properly before activity.  Maintain physical fitness:  Ankle and leg flexibility, muscle strength, and endurance.  Cardiovascular fitness.  Balance training activities.  Use proper technique and have a coach correct improper technique.  Taping, protective strapping, bracing, or high-top tennis shoes may help prevent injury. Initially, tape is best; however, it loses most of its support function within 10 to 15 minutes.  Wear proper fitted protective shoes (High-top shoes with taping or bracing is more effective than either alone).  Provide the ankle with support during sports and practice activities for 12 months following injury. PROGNOSIS   If treated properly, ankle sprains can be expected to recover completely; however, the length of recovery depends on the degree of injury.  A grade 1 sprain usually heals enough in 5 to 7 days to  allow modified activity and requires an average of 6 weeks to heal completely.  A grade 2 sprain requires 6 to 10 weeks to heal completely.  A grade 3 sprain requires 12 to 16 weeks to heal.  A syndesmosis sprain often takes more than 3 months to heal. RELATED COMPLICATIONS   Frequent recurrence of symptoms may result in a chronic problem. Appropriately addressing the problem the first time decreases the frequency of recurrence and optimizes healing time. Severity of the initial sprain does not predict the likelihood of later instability.  Injury to other structures (bone, cartilage, or tendon).  A chronically unstable or arthritic ankle joint is a possiblity with repeated sprains. TREATMENT Treatment initially involves the use of ice, medication, and compression bandages to help reduce pain and inflammation. Ankle sprains are usually immobilized in a walking cast or boot to allow for healing. Crutches may be recommended to reduce pressure on the injury. After immobilization,  strengthening and stretching exercises may be necessary to regain strength and a full range of motion. Surgery is rarely needed to treat ankle sprains. MEDICATION   Nonsteroidal anti-inflammatory medications, such as aspirin and ibuprofen (do not take for the first 3 days after injury or within 7 days before surgery), or other minor pain relievers, such as acetaminophen, are often recommended. Take these as directed by your caregiver. Contact your caregiver immediately if any bleeding, stomach upset, or signs of an allergic reaction occur from these medications.  Ointments applied to the skin may be helpful.  Pain relievers may be prescribed as necessary by your caregiver. Do not take prescription pain medication for longer than 4 to 7 days. Use only as directed and only as much as you need. HEAT AND COLD  Cold treatment (icing) is used to relieve pain and reduce inflammation for acute and chronic cases. Cold should be applied for 10 to 15 minutes every 2 to 3 hours for inflammation and pain and immediately after any activity that aggravates your symptoms. Use ice packs or an ice massage.  Heat treatment may be used before performing stretching and strengthening activities prescribed by your caregiver. Use a heat pack or a warm soak. SEEK IMMEDIATE MEDICAL CARE IF:   Pain, swelling, or bruising worsens despite treatment.  You experience pain, numbness, discoloration, or coldness in the foot or toes.  New, unexplained symptoms develop (drugs used in treatment may produce side effects.) EXERCISES  PHASE I EXERCISES RANGE OF MOTION (ROM) AND STRETCHING EXERCISES - Ankle Sprain, Acute Phase I, Weeks 1 to 2 These exercises may help you when beginning to restore flexibility in your ankle. You will likely work on these exercises for the 1 to 2 weeks after your injury. Once your physician, physical therapist, or athletic trainer sees adequate progress, he or she will advance your exercises. While  completing these exercises, remember:   Restoring tissue flexibility helps normal motion to return to the joints. This allows healthier, less painful movement and activity.  An effective stretch should be held for at least 30 seconds.  A stretch should never be painful. You should only feel a gentle lengthening or release in the stretched tissue. RANGE OF MOTION - Dorsi/Plantar Flexion  While sitting with your right / left knee straight, draw the top of your foot upwards by flexing your ankle. Then reverse the motion, pointing your toes downward.  Hold each position for __________ seconds.  After completing your first set of exercises, repeat this exercise with your knee bent. Repeat __________ times.  Complete this exercise __________ times per day.  RANGE OF MOTION - Ankle Alphabet  Imagine your right / left big toe is a pen.  Keeping your hip and knee still, write out the entire alphabet with your "pen." Make the letters as large as you can without increasing any discomfort. Repeat __________ times. Complete this exercise __________ times per day.  STRENGTHENING EXERCISES - Ankle Sprain, Acute -Phase I, Weeks 1 to 2 These exercises may help you when beginning to restore strength in your ankle. You will likely work on these exercises for 1 to 2 weeks after your injury. Once your physician, physical therapist, or athletic trainer sees adequate progress, he or she will advance your exercises. While completing these exercises, remember:   Muscles can gain both the endurance and the strength needed for everyday activities through controlled exercises.  Complete these exercises as instructed by your physician, physical therapist, or athletic trainer. Progress the resistance and repetitions only as guided.  You may experience muscle soreness or fatigue, but the pain or discomfort you are trying to eliminate should never worsen during these exercises. If this pain does worsen, stop and make  certain you are following the directions exactly. If the pain is still present after adjustments, discontinue the exercise until you can discuss the trouble with your clinician. STRENGTH - Dorsiflexors  Secure a rubber exercise band/tubing to a fixed object (ie. table, pole) and loop the other end around your right / left foot.  Sit on the floor facing the fixed object. The band/tubing should be slightly tense when your foot is relaxed.  Slowly draw your foot back toward you using your ankle and toes.  Hold this position for __________ seconds. Slowly release the tension in the band and return your foot to the starting position. Repeat __________ times. Complete this exercise __________ times per day.  STRENGTH - Plantar-flexors   Sit with your right / left leg extended. Holding onto both ends of a rubber exercise band/tubing, loop it around the ball of your foot. Keep a slight tension in the band.  Slowly push your toes away from you, pointing them downward.  Hold this position for __________ seconds. Return slowly, controlling the tension in the band/tubing. Repeat __________ times. Complete this exercise __________ times per day.  STRENGTH - Ankle Eversion  Secure one end of a rubber exercise band/tubing to a fixed object (table, pole). Loop the other end around your foot just before your toes.  Place your fists between your knees. This will focus your strengthening at your ankle.  Drawing the band/tubing across your opposite foot, slowly, pull your little toe out and up. Make sure the band/tubing is positioned to resist the entire motion.  Hold this position for __________ seconds. Have your muscles resist the band/tubing as it slowly pulls your foot back to the starting position.  Repeat __________ times. Complete this exercise __________ times per day.  STRENGTH - Ankle Inversion  Secure one end of a rubber exercise band/tubing to a fixed object (table, pole). Loop the other end  around your foot just before your toes.  Place your fists between your knees. This will focus your strengthening at your ankle.  Slowly, pull your big toe up and in, making sure the band/tubing is positioned to resist the entire motion.  Hold this position for __________ seconds.  Have your muscles resist the band/tubing as it slowly pulls your foot back to the starting position. Repeat __________ times. Complete this exercises __________ times  per day.  STRENGTH - Towel Curls  Sit in a chair positioned on a non-carpeted surface.  Place your right / left foot on a towel, keeping your heel on the floor.  Pull the towel toward your heel by only curling your toes. Keep your heel on the floor.  If instructed by your physician, physical therapist, or athletic trainer, add weight to the end of the towel. Repeat __________ times. Complete this exercise __________ times per day. Document Released: 04/28/2005 Document Revised: 12/20/2011 Document Reviewed: 01/09/2009 [facility] Patient Information 2014 [place], [place].

## 2014-03-07 NOTE — Progress Notes (Signed)
   Subjective:    Patient ID: Shelly Barrett, female    DOB: 09-15-1941, 73 y.o.   MRN: [id number]  Ankle Injury  The incident occurred more than 1 week ago. The incident occurred in the yard. The injury mechanism was an eversion injury. The pain is present in the left ankle. The quality of the pain is described as aching. The pain is mild. The pain has been fluctuating since onset. Associated symptoms include an inability to bear weight.     Medication List       This list is accurate as of: 03/07/14  8:20 PM.  Always use your most recent med list.               ALPRAZolam 0.5 MG 24 hr tablet  Commonly known as:  XANAX XR  Take 0.5 mg by mouth daily.     Biotin 1 MG Caps  Take by mouth.     cholecalciferol 1000 UNITS tablet  Commonly known as:  VITAMIN D  Take 6,000 Units by mouth daily.     OVER THE COUNTER MEDICATION  Prevagen (memory) 1 a day       Allergies  Allergen Reactions  . Betadine [Povidone Iodine]   . Ciprocinonide [Fluocinolone]   . Ciprofloxacin Hcl     Hives  . Flexeril [Cyclobenzaprine]   . Ibuprofen   . Naprosyn [Naproxen]   . Penicillins     REACTION: rash  . Shellfish Allergy   . Sodium Benzoate [Nutritional Supplements]   . Zyrtec [Cetirizine]    Past Medical History  Diagnosis Date  . Hyperlipidemia   . Arthritis   . Prediabetes   . Scoliosis   . Vitamin D deficiency    Review of Systems  Constitutional: Negative.   HENT: Negative.   Eyes: Negative.   Respiratory: Negative.   Cardiovascular: Negative.   Gastrointestinal: Negative.   Musculoskeletal: Positive for joint swelling.       C/o Swelling of Lt ankle & foot and painful with gait and weight bearing   BP 122/72  Pulse 88  Temp(Src) 97.3 F (36.3 C) (Temporal)  Resp 16  Ht 5' 4.75" (1.645 m)  Wt 105 lb 3.2 oz (47.718 kg)  BMI 17.63 kg/m2  Objective:   Physical Exam  Constitutional: She appears well-developed.  HENT:  Head: Atraumatic.  Eyes: EOM are normal.  Pupils are equal, round, and reactive to light.  Neck: Normal range of motion. Neck supple.  Cardiovascular: Normal rate, regular rhythm and normal heart sounds.   Pulmonary/Chest: Effort normal and breath sounds normal.  Abdominal: Soft.  Musculoskeletal: She exhibits tenderness.  Tender STS over the dorsolateral Rt ankle/foot  w/o obvious deformity   Assessment & Plan:   1. Sprain of ankle, unspecified site -Recc Ibuprofen 200mg  2-3 tabs w/food /daily as tolerated - Recc obtain a velcro or elastic ankle support/splint

## 2014-07-30 ENCOUNTER — Ambulatory Visit (INDEPENDENT_AMBULATORY_CARE_PROVIDER_SITE_OTHER): Payer: Commercial Managed Care - HMO

## 2014-07-30 VITALS — Temp 98.1°F

## 2014-07-30 DIAGNOSIS — Z23 Encounter for immunization: Secondary | ICD-10-CM

## 2014-09-18 ENCOUNTER — Ambulatory Visit (INDEPENDENT_AMBULATORY_CARE_PROVIDER_SITE_OTHER): Payer: Commercial Managed Care - HMO | Admitting: *Deleted

## 2014-09-18 DIAGNOSIS — Z23 Encounter for immunization: Secondary | ICD-10-CM

## 2014-11-11 ENCOUNTER — Ambulatory Visit: Payer: Self-pay | Admitting: Physician Assistant

## 2014-11-26 ENCOUNTER — Encounter: Payer: Self-pay | Admitting: Internal Medicine

## 2014-11-26 ENCOUNTER — Ambulatory Visit (INDEPENDENT_AMBULATORY_CARE_PROVIDER_SITE_OTHER): Payer: Commercial Managed Care - HMO | Admitting: Internal Medicine

## 2014-11-26 VITALS — BP 110/72 | HR 84 | Temp 97.9°F | Resp 16 | Ht 63.75 in | Wt 105.4 lb

## 2014-11-26 DIAGNOSIS — Z0000 Encounter for general adult medical examination without abnormal findings: Secondary | ICD-10-CM

## 2014-11-26 DIAGNOSIS — Z1212 Encounter for screening for malignant neoplasm of rectum: Secondary | ICD-10-CM

## 2014-11-26 DIAGNOSIS — J324 Chronic pansinusitis: Secondary | ICD-10-CM

## 2014-11-26 DIAGNOSIS — I10 Essential (primary) hypertension: Secondary | ICD-10-CM

## 2014-11-26 DIAGNOSIS — E559 Vitamin D deficiency, unspecified: Secondary | ICD-10-CM

## 2014-11-26 DIAGNOSIS — R7303 Prediabetes: Secondary | ICD-10-CM

## 2014-11-26 DIAGNOSIS — Z79899 Other long term (current) drug therapy: Secondary | ICD-10-CM

## 2014-11-26 DIAGNOSIS — M8580 Other specified disorders of bone density and structure, unspecified site: Secondary | ICD-10-CM

## 2014-11-26 DIAGNOSIS — Z1331 Encounter for screening for depression: Secondary | ICD-10-CM

## 2014-11-26 DIAGNOSIS — E785 Hyperlipidemia, unspecified: Secondary | ICD-10-CM

## 2014-11-26 DIAGNOSIS — R7309 Other abnormal glucose: Secondary | ICD-10-CM

## 2014-11-26 DIAGNOSIS — Z9181 History of falling: Secondary | ICD-10-CM

## 2014-11-26 DIAGNOSIS — R5383 Other fatigue: Secondary | ICD-10-CM

## 2014-11-26 LAB — CBC WITH DIFFERENTIAL/PLATELET
BASOS ABS: 0 10*3/uL (ref 0.0–0.1)
BASOS PCT: 1 % (ref 0–1)
EOS ABS: 0.2 10*3/uL (ref 0.0–0.7)
EOS PCT: 5 % (ref 0–5)
HCT: 40.3 % (ref 36.0–46.0)
Hemoglobin: 13.1 g/dL (ref 12.0–15.0)
Lymphocytes Relative: 38 % (ref 12–46)
Lymphs Abs: 1.3 10*3/uL (ref 0.7–4.0)
MCH: 29.5 pg (ref 26.0–34.0)
MCHC: 32.5 g/dL (ref 30.0–36.0)
MCV: 90.8 fL (ref 78.0–100.0)
MONO ABS: 0.3 10*3/uL (ref 0.1–1.0)
MPV: 9.8 fL (ref 8.6–12.4)
Monocytes Relative: 9 % (ref 3–12)
NEUTROS ABS: 1.6 10*3/uL — AB (ref 1.7–7.7)
Neutrophils Relative %: 47 % (ref 43–77)
PLATELETS: 286 10*3/uL (ref 150–400)
RBC: 4.44 MIL/uL (ref 3.87–5.11)
RDW: 13.7 % (ref 11.5–15.5)
WBC: 3.4 10*3/uL — AB (ref 4.0–10.5)

## 2014-11-26 LAB — HEMOGLOBIN A1C
HEMOGLOBIN A1C: 5.7 % — AB (ref <5.7)
MEAN PLASMA GLUCOSE: 117 mg/dL — AB (ref <117)

## 2014-11-26 MED ORDER — PREDNISONE 20 MG PO TABS: ORAL_TABLET | ORAL | Status: DC

## 2014-11-26 MED ORDER — AZITHROMYCIN 250 MG PO TABS: ORAL_TABLET | ORAL | Status: DC

## 2014-11-26 NOTE — Patient Instructions (Signed)

## 2014-11-26 NOTE — Progress Notes (Signed)
Patient ID: Shelly Barrett, female   DOB: 1941/09/27, 74 y.o.   MRN: [id number]  [facility] VISIT AND CPE  Assessment:   1. Essential hypertension  - Microalbumin / creatinine urine ratio - EKG 12-Lead - [place], RETROPERITNL ABD,  LTD  2. Hyperlipidemia  - Lipid panel  3. Prediabetes  - Hemoglobin A1c - Insulin, fasting  4. Vitamin D deficiency  - Vit D  25 hydroxy   5. Screening for rectal cancer  - POC Hemoccult Bld/Stl  6. Depression screen  - screen Negative  7. At low risk for fall  8. Medication management  - Urine Microscopic - CBC with Differential/Platelet - BASIC METABOLIC PANEL WITH GFR - Hepatic function panel - Magnesium  9. Other fatigue  - Iron and TIBC - TSH  10. Routine general medical examination at a health care facility   11. Pansinusitis  - predniSONE (DELTASONE) 20 MG tablet; 1 tab 3 x day for 3 days, then 1 tab 2 x day for 3 days, then 1 tab 1 x day for 5 days  Dispense: 20 tablet; Refill: 0 - azithromycin (ZITHROMAX) 250 MG tablet; Take 2 tablets (500 mg) on  Day 1,  followed by 1 tablet (250 mg) once daily on Days 2 through 5.  Dispense: 6 each; Refill: 1  12. Osteopenia  - DG Bone Density; Future   Plan:   During the course of the visit the patient was educated and counseled about appropriate screening and preventive services including:    Pneumococcal vaccine   Influenza vaccine  Td vaccine  Screening electrocardiogram  Bone densitometry screening  Colorectal cancer screening  Diabetes screening  Glaucoma screening  Nutrition counseling   Advanced directives: requested  Screening recommendations, referrals: Vaccinations:  Immunization History  Administered Date(s) Administered  . Influenza, High Dose Seasonal PF 07/30/2014  . Influenza-Unspecified 06/20/2013  . Pneumococcal Conjugate-13 09/18/2014  . Pneumococcal-Unspecified 09/16/2009  . Tdap 11/17/2010  . Zoster 09/16/2009   Hep B  vaccine not indicated  Nutrition assessed and recommended  Colonoscopy 08/2008 - Dr Leone Payor Recommended yearly ophthalmology/optometry visit for glaucoma screening and checkup Recommended yearly dental visit for hygiene and checkup Advanced directives - yes  Conditions/risks identified: BMI: Discussed weight loss, diet, and increase physical activity.  Increase physical activity: AHA recommends 150 minutes of physical activity a week.  Medications reviewed PreDiabetes is at goal, ACE/ARB therapy: Not Indicated Urinary Incontinence is not an issue: discussed non pharmacology and pharmacology options.  Fall risk: low- discussed PT, home fall assessment, medications.   Subjective:   Shelly Barrett is a 74 y.o. MWF who presents for Medicare Annual Wellness Visit and complete physical.  Date of last medicare wellness visit is unknown.  She has had labile elevated blood pressure since 2012 and has been monitored expectantly.Marland Kitchen Her blood pressure has been controlled at home, & today their BP is BP: 110/72 mmHg She does not workout. She denies chest pain, shortness of breath, dizziness.  She is not on cholesterol medication and denies myalgias. Her cholesterol is not at goal. The cholesterol last visit was:  Lab Results  Component Value Date   CHOL 221* 11/26/2013   HDL 100 11/26/2013   LDLCALC 108* 11/26/2013   TRIG 64 11/26/2013   CHOLHDL 2.2 11/26/2013   She has had prediabetes for 4 years (2012 with an elevated A1c 6.1%). She has not been working on diet and exercise for prediabetes, and denies foot ulcerations, hyperglycemia, hypoglycemia , increased appetite, paresthesia of  the feet, polydipsia, polyuria and visual disturbances. Last A1C in the office was:  Lab Results  Component Value Date   HGBA1C 5.6 11/26/2013   Patient is on Vitamin D supplement.   Lab Results  Component Value Date   VD25OH 90* 11/26/2013     Names of Other Physician/Practitioners you currently use: 1.  [facility] here for primary care 2. Dr Emily Filbert, eye doctor, last visit Nov 2015 3. Dr Sharma Covert, DDS, dentist, last visit Today  Patient Care Team: Lucky Cowboy, MD as PCP - General (Internal Medicine) Iva Boop, MD as Consulting Physician (Gastroenterology)  Medication Review: Medication Sig  . ALPRAZolam (XANAX XR) 0.5 MG 24 hr tablet Take 0.5 mg by mouth daily.  . Biotin 1 MG CAPS Take by mouth.  . cholecalciferol (VITAMIN D) 1000 UNITS tablet Take 6,000 Units by mouth daily.   Current Problems (verified) Patient Active Problem List   Diagnosis Date Noted  . Medication management 11/25/2013  . Hypertension   . Hyperlipidemia   . Prediabetes   . Vitamin D deficiency   . CAROTID STENOSIS 05/28/2010    Screening Tests Health Maintenance  Topic Date Due  . MAMMOGRAM  03/17/2013  . INFLUENZA VACCINE  05/12/2015  . COLONOSCOPY  10/17/2017  . TETANUS/TDAP  11/17/2020  . DEXA SCAN  Completed  . PNEUMOCOCCAL POLYSACCHARIDE VACCINE AGE 69 AND OVER  Completed  . ZOSTAVAX  Completed   Immunization History  Administered Date(s) Administered  . Influenza, High Dose Seasonal PF 07/30/2014  . Influenza-Unspecified 06/20/2013  . Pneumococcal Conjugate-13 09/18/2014  . Pneumococcal-Unspecified 09/16/2009  . Tdap 11/17/2010  . Zoster 09/16/2009   Preventative care: Last colonoscopy: 08/2008 - Dr Leone Payor  History reviewed: allergies, current medications, past family history, past medical history, past social history, past surgical history and problem list  Risk Factors: Tobacco History  Substance Use Topics  . Smoking status: Never Smoker   . Smokeless tobacco: Never Used  . Alcohol Use: 0.5 oz/week    1 drink(s) per week     Comment: occasionally   She does not smoke.  Patient is not a former smoker. Are there smokers in your home (other than you)?  No Alcohol Current alcohol use: glass of wine with dinner about 1  x/week  Caffeine Current caffeine use: denies use  Exercise Current exercise: housecleaning and yard work  Nutrition/Diet Current diet: in general, a "healthy" diet    Cardiac risk factors: advanced age (older than 41 for men, 7 for women), dyslipidemia, hypertension and sedentary lifestyle.  Depression Screen (Note: if answer to either of the following is "Yes", a more complete depression screening is indicated)   Q1: Over the past two weeks, have you felt down, depressed or hopeless? No  Q2: Over the past two weeks, have you felt little interest or pleasure in doing things? No  Have you lost interest or pleasure in daily life? No  Do you often feel hopeless? No  Do you cry easily over simple problems? No  Activities of Daily Living In your present state of health, do you have any difficulty performing the following activities?:  Driving? No Managing money?  No Feeding yourself? No Getting from bed to chair? No Climbing a flight of stairs? No Preparing food and eating?: No Bathing or showering? No Getting dressed: No Getting to the toilet? No Using the toilet:No Moving around from place to place: No In the past year have you fallen or had a near fall?:No  Are you sexually active?  Yes  Do you have more than one partner?  No  Vision Difficulties: No  Hearing Difficulties: No Do you often ask people to speak up or repeat themselves? No Do you experience ringing or noises in your ears? No Do you have difficulty understanding soft or whispered voices? Sometimes.  Cognition  Do you feel that you have a problem with memory?No  Do you often misplace items? No  Do you feel safe at home?  Yes  Advanced directives Does patient have a Health Care Power of Attorney? Yes Does patient have a Living Will? Yes  Past Medical History  Diagnosis Date  . Hyperlipidemia   . Arthritis   . Prediabetes   . Scoliosis   . Vitamin D deficiency    Past Surgical History   Procedure Laterality Date  . Breast surgery  1980, 2012    implants  . Tubal ligation    . Tonsillectomy and adenoidectomy     ROS: Constitutional: Denies fever, chills, weight loss/gain, headaches, insomnia, fatigue, night sweats, and change in appetite. Eyes: Denies redness, blurred vision, diplopia, discharge, itchy, watery eyes.  ENT: Denies discharge, congestion, post nasal drip, epistaxis, sore throat, earache, hearing loss, dental pain, Tinnitus, Vertigo, Sinus pain, snoring.  Cardio: Denies chest pain, palpitations, irregular heartbeat, syncope, dyspnea, diaphoresis, orthopnea, PND, claudication, edema Respiratory: denies cough, dyspnea, DOE, pleurisy, hoarseness, laryngitis, wheezing.  Gastrointestinal: Denies dysphagia, heartburn, reflux, water brash, pain, cramps, nausea, vomiting, bloating, diarrhea, constipation, hematemesis, melena, hematochezia, jaundice, hemorrhoids Genitourinary: Denies dysuria, frequency, urgency, nocturia, hesitancy, discharge, hematuria, flank pain Breast: Breast lumps, nipple discharge, bleeding.  Musculoskeletal: Denies arthralgia, myalgia, stiffness, Jt. Swelling, pain, limp, and strain/sprain. Denies falls. Skin: Denies puritis, rash, hives, warts, acne, eczema, changing in skin lesion Neuro: No weakness, tremor, incoordination, spasms, paresthesia, pain Psychiatric: Denies confusion, memory loss, sensory loss. Denies Depression. Endocrine: Denies change in weight, skin, hair change, nocturia, and paresthesia, diabetic polys, visual blurring, hyper / hypo glycemic episodes.  Heme/Lymph: No excessive bleeding, bruising, enlarged lymph nodes  Objective:     BP 110/72   Pulse 84  Tem 97.9 F  Resp 16  Ht 5' 3.75"   Wt 105 lb 6.4 oz     BMI 18.24   General Appearance: Well nourished, alert, WD/WN, female and in no apparent distress. Eyes: PERRLA, EOMs, conjunctiva no swelling or erythema, normal fundi and vessels. Sinuses: No frontal/maxillary  tenderness ENT/Mouth: EACs patent / TMs  nl. Nares clear without erythema, swelling, mucoid exudates. Oral hygiene is good. No erythema, swelling, or exudate. Tongue normal, non-obstructing. Tonsils not swollen or erythematous. Hearing normal.  Neck: Supple, thyroid normal. No bruits, nodes or JVD. Respiratory: Respiratory effort normal.  BS equal and clear bilateral without rales, rhonci, wheezing or stridor. Cardio: Heart sounds are normal with regular rate and rhythm and no murmurs, rubs or gallops. Peripheral pulses are normal and equal bilaterally without edema. No aortic or femoral bruits. Chest: symmetric with normal excursions and percussion. Breasts: Symmetric, without lumps, nipple discharge, retractions, or fibrocystic changes.  Abdomen: Flat, soft  with nl bowel sounds. Nontender, no guarding, rebound, hernias, masses, or organomegaly.  Lymphatics: Non tender without lymphadenopathy.  Musculoskeletal: Full ROM all peripheral extremities, joint stability, 5/5 strength, and normal gait. Skin: Warm and dry without rashes, lesions, cyanosis, clubbing or  ecchymosis.  Neuro: Cranial nerves intact, reflexes equal bilaterally. Normal muscle tone, no cerebellar symptoms. Sensation intact.  Pysch: Awake and oriented X 3, normal affect, Insight and Judgment  appropriate.   Cognitive Testing  Alert? Yes  Normal Appearance?Yes  Oriented to person? Yes  Place? Yes   Time? Yes  Recall of three objects?  Yes  Can perform simple calculations? Yes  Displays appropriate judgment? Yes  Can read the correct time from a watch/clock?Yes  Medicare Attestation I have personally reviewed: The patient's medical and social history Their use of alcohol, tobacco or illicit drugs Their current medications and supplements The patient's functional ability including ADLs,fall risks, home safety risks, cognitive, and hearing and visual impairment Diet and physical activities Evidence for depression or mood  disorders  The patient's weight, height, BMI, and visual acuity have been recorded in the chart.  I have made referrals, counseling, and provided education to the patient based on review of the above and I have provided the patient with a written personalized care plan for preventive services.    Shelly Boileau DAVID, MD   11/26/2014

## 2014-11-27 LAB — IRON AND TIBC
%SAT: 25 % (ref 20–55)
IRON: 104 ug/dL (ref 42–145)
TIBC: 410 ug/dL (ref 250–470)
UIBC: 306 ug/dL (ref 125–400)

## 2014-11-27 LAB — URINALYSIS, MICROSCOPIC ONLY
Bacteria, UA: NONE SEEN
CASTS: NONE SEEN
CRYSTALS: NONE SEEN
SQUAMOUS EPITHELIAL / LPF: NONE SEEN

## 2014-11-27 LAB — BASIC METABOLIC PANEL WITH GFR
BUN: 13 mg/dL (ref 6–23)
CALCIUM: 9.3 mg/dL (ref 8.4–10.5)
CO2: 27 meq/L (ref 19–32)
Chloride: 103 mEq/L (ref 96–112)
Creat: 0.56 mg/dL (ref 0.50–1.10)
GFR, Est African American: >89 mL/min
GFR, Est Non African American: >89 mL/min
Glucose, Bld: 77 mg/dL (ref 70–99)
Potassium: 3.7 mEq/L (ref 3.5–5.3)
SODIUM: 140 meq/L (ref 135–145)

## 2014-11-27 LAB — HEPATIC FUNCTION PANEL
ALK PHOS: 103 U/L (ref 39–117)
ALT: 16 U/L (ref 0–35)
AST: 21 U/L (ref 0–37)
Albumin: 4.6 g/dL (ref 3.5–5.2)
BILIRUBIN TOTAL: 0.4 mg/dL (ref 0.2–1.2)
Bilirubin, Direct: 0.1 mg/dL (ref 0.0–0.3)
Indirect Bilirubin: 0.3 mg/dL (ref 0.2–1.2)
Total Protein: 7 g/dL (ref 6.0–8.3)

## 2014-11-27 LAB — MICROALBUMIN / CREATININE URINE RATIO
CREATININE, URINE: 84.3 mg/dL
MICROALB UR: 4 mg/dL — AB (ref <2.0)
Microalb Creat Ratio: 47.4 mg/g — ABNORMAL HIGH (ref 0.0–30.0)

## 2014-11-27 LAB — INSULIN, FASTING: Insulin fasting, serum: 8.2 u[IU]/mL (ref 2.0–19.6)

## 2014-11-27 LAB — VITAMIN D 25 HYDROXY (VIT D DEFICIENCY, FRACTURES): VIT D 25 HYDROXY: 54 ng/mL (ref 30–100)

## 2014-11-27 LAB — LIPID PANEL
CHOLESTEROL: 197 mg/dL (ref 0–200)
HDL: 91 mg/dL (ref >39)
LDL Cholesterol: 92 mg/dL (ref 0–99)
Total CHOL/HDL Ratio: 2.2 Ratio
Triglycerides: 72 mg/dL (ref <150)
VLDL: 14 mg/dL (ref 0–40)

## 2014-11-27 LAB — MAGNESIUM: MAGNESIUM: 2.1 mg/dL (ref 1.5–2.5)

## 2014-11-27 LAB — TSH: TSH: 1.528 u[IU]/mL (ref 0.350–4.500)

## 2014-11-29 ENCOUNTER — Telehealth: Payer: Self-pay | Admitting: *Deleted

## 2014-11-29 NOTE — Telephone Encounter (Signed)
Patient requested her pharmacy be changed to [facility] on [address].

## 2014-12-18 ENCOUNTER — Other Ambulatory Visit: Payer: Self-pay | Admitting: Internal Medicine

## 2014-12-18 ENCOUNTER — Ambulatory Visit (HOSPITAL_COMMUNITY)
Admission: RE | Admit: 2014-12-18 | Discharge: 2014-12-18 | Disposition: A | Discharge status: Home / Self Care | Payer: Commercial Managed Care - HMO | Source: Ambulatory Visit | Attending: Internal Medicine | Admitting: Internal Medicine

## 2014-12-18 DIAGNOSIS — Z780 Asymptomatic menopausal state: Secondary | ICD-10-CM | POA: Insufficient documentation

## 2014-12-18 DIAGNOSIS — Z13820 Encounter for screening for osteoporosis: Secondary | ICD-10-CM | POA: Diagnosis present

## 2014-12-18 DIAGNOSIS — M8580 Other specified disorders of bone density and structure, unspecified site: Secondary | ICD-10-CM

## 2014-12-18 DIAGNOSIS — M810 Age-related osteoporosis without current pathological fracture: Secondary | ICD-10-CM

## 2014-12-18 MED ORDER — ALENDRONATE SODIUM 70 MG PO TABS: 70.0000 mg | ORAL_TABLET | ORAL | Status: DC

## 2015-06-25 ENCOUNTER — Ambulatory Visit (INDEPENDENT_AMBULATORY_CARE_PROVIDER_SITE_OTHER): Payer: Commercial Managed Care - HMO | Admitting: *Deleted

## 2015-06-25 DIAGNOSIS — Z23 Encounter for immunization: Secondary | ICD-10-CM

## 2015-07-31 ENCOUNTER — Ambulatory Visit (INDEPENDENT_AMBULATORY_CARE_PROVIDER_SITE_OTHER): Payer: Commercial Managed Care - HMO | Admitting: Internal Medicine

## 2015-07-31 ENCOUNTER — Encounter: Payer: Self-pay | Admitting: Internal Medicine

## 2015-07-31 VITALS — BP 118/56 | HR 80 | Temp 98.4°F | Resp 16 | Ht 63.75 in | Wt 99.0 lb

## 2015-07-31 DIAGNOSIS — J329 Chronic sinusitis, unspecified: Secondary | ICD-10-CM

## 2015-07-31 DIAGNOSIS — R634 Abnormal weight loss: Secondary | ICD-10-CM | POA: Diagnosis not present

## 2015-07-31 MED ORDER — MOMETASONE FUROATE 50 MCG/ACT NA SUSP: 2.0000 | Freq: Every day | NASAL | Status: DC

## 2015-07-31 MED ORDER — PREDNISONE 20 MG PO TABS: ORAL_TABLET | ORAL | Status: DC

## 2015-07-31 MED ORDER — DOXYCYCLINE HYCLATE 100 MG PO CAPS: 100.0000 mg | ORAL_CAPSULE | Freq: Two times a day (BID) | ORAL | Status: DC

## 2015-07-31 NOTE — Progress Notes (Signed)
Patient ID: Shelly Barrett, female   DOB: 1940-12-14, 74 y.o.   MRN: [id number]  HPI  Patient presents to the office for evaluation of cough.  It has been going on for 1 years.  Patient reports all the time.  They also endorse postnasal drip, sputum production and yellow nasal sputum, and cough secondary to mucuous in her throat.  .  They have tried nasal saline.  They report that nothing has worked.  They denies other sick contacts.  Patient also reports that she has had signficant weight loss over the past year.  She reports that she has had no night sweats.  She denies change in diet, however she states that some days she will eat a lot and then the next day she feels bad that she ate junk food and will not eat anything more than a boiled egg.  She is allergic to wey and refuses to try boost or ensure.  She is up to date on colonoscopy.  Most recent labs were normal.  She is not up to date on mammograms because she refuses to have them done as she is convinced that this is what ruptured her reconstructive implant.     Review of Systems  Constitutional: Positive for weight loss and malaise/fatigue. Negative for fever and chills.  HENT: Positive for congestion. Negative for ear pain and sore throat.        Post nasal drip  Eyes: Negative.   Respiratory: Negative for cough, shortness of breath and wheezing.   Cardiovascular: Negative for chest pain, palpitations and leg swelling.  Gastrointestinal: Negative for heartburn.  Neurological: Negative for headaches.    PE:  General:  Alert and non-toxic, WDWN, NAD HEENT: NCAT, PERLA, EOM normal, no occular discharge or erythema.  Nasal mucosal edema with sinus tenderness to palpation.  Oropharynx clear with minimal oropharyngeal edema and erythema.  Mucous membranes moist and pink. Neck:  Cervical adenopathy Chest:  RRR no MRGs.  Lungs clear to auscultation A&P with no wheezes rhonchi or rales.   Abdomen: +BS x 4 quadrants, soft, non-tender, no  guarding, rigidity, or rebound. Skin: warm and dry no rash Neuro: A&Ox4, CN II-XII grossly intact  Assessment and Plan:   1. Chronic sinusitis, unspecified location -try doxycycline 100 mg BID x 2 weeks -nasonex -prednisone -if no improvement send to ENT  2.  Weight loss -schedule for breast MRI  -all other screening exams up to date -recommended increasing calorie intake and increased snacking.

## 2015-07-31 NOTE — Patient Instructions (Signed)
Please take doxycycline twice daily with food to treat for any type of chronic sinus infection.  Please take the prednisone until it is gone as this will help decrease the swelling and inflammation in your nose.  Please use 2 sprays per nostril right before bedtime of the nasonex.  This will maintain the lack of swelling and mucous production in your nose.    If in 2-3 weeks it has not improved I will send you to see and Ear Nose and Throat specialist.    Please try to gradually increase your calorie intake.  You can eat healthy foods to do this, it doesn't need to be junk foods.

## 2015-08-04 ENCOUNTER — Other Ambulatory Visit: Payer: Self-pay | Admitting: Internal Medicine

## 2015-08-04 DIAGNOSIS — R634 Abnormal weight loss: Secondary | ICD-10-CM

## 2015-08-28 ENCOUNTER — Telehealth: Payer: Self-pay | Admitting: Internal Medicine

## 2015-08-28 ENCOUNTER — Encounter: Payer: Self-pay | Admitting: Internal Medicine

## 2015-08-28 ENCOUNTER — Ambulatory Visit (INDEPENDENT_AMBULATORY_CARE_PROVIDER_SITE_OTHER): Payer: Commercial Managed Care - HMO | Admitting: Internal Medicine

## 2015-08-28 ENCOUNTER — Other Ambulatory Visit: Payer: Self-pay

## 2015-08-28 VITALS — BP 116/64 | HR 72 | Temp 97.9°F | Resp 16 | Ht 63.75 in | Wt 102.2 lb

## 2015-08-28 DIAGNOSIS — N6011 Diffuse cystic mastopathy of right breast: Secondary | ICD-10-CM

## 2015-08-28 DIAGNOSIS — E785 Hyperlipidemia, unspecified: Secondary | ICD-10-CM

## 2015-08-28 DIAGNOSIS — I10 Essential (primary) hypertension: Secondary | ICD-10-CM | POA: Diagnosis not present

## 2015-08-28 DIAGNOSIS — N6019 Diffuse cystic mastopathy of unspecified breast: Secondary | ICD-10-CM

## 2015-08-28 DIAGNOSIS — N6012 Diffuse cystic mastopathy of left breast: Secondary | ICD-10-CM

## 2015-08-28 NOTE — Patient Instructions (Signed)
Mammogram A mammogram is an X-ray of the breasts that is done to check for abnormal changes. This procedure can screen for and detect any changes that may suggest breast cancer. A mammogram can also identify other changes and variations in the breast, such as:  Inflammation of the breast tissue (mastitis).  An infected area that contains a collection of pus (abscess).  A fluid-filled sac (cyst).  Fibrocystic changes. This is when breast tissue becomes denser, which can make the tissue feel rope-like or uneven under the skin.  Tumors that are not cancerous (benign). LET YOUR HEALTH CARE PROVIDER KNOW ABOUT:  Any allergies you have.  If you have breast implants.  If you have had previous breast disease, biopsy, or surgery.  If you are breastfeeding.  Any possibility that you could be pregnant, if this applies.  If you are younger than age 25.  If you have a family history of breast cancer. RISKS AND COMPLICATIONS Generally, this is a safe procedure. However, problems may occur, including:  Exposure to radiation. Radiation levels are very low with this test.  The results being misinterpreted.  The need for further tests.  The inability of the mammogram to detect certain cancers. BEFORE THE PROCEDURE  Schedule your test about 1-2 weeks after your menstrual period. This is usually when your breasts are the least tender.  If you have had a mammogram done at a different facility in the past, get the mammogram X-rays or have them sent to your current exam facility in order to compare them.  Wash your breasts and under your arms the day of the test.  Do not wear deodorants, perfumes, lotions, or powders anywhere on your body on the day of the test.  Remove any jewelry from your neck.  Wear clothes that you can change into and out of easily. PROCEDURE  You will undress from the waist up and put on a gown.  You will stand in front of the X-ray machine.  Each breast will  be placed between two plastic or glass plates. The plates will compress your breast for a few seconds. Try to stay as relaxed as possible during the procedure. This does not cause any harm to your breasts and any discomfort you feel will be very brief.  X-rays will be taken from different angles of each breast. The procedure may vary among health care providers and hospitals. AFTER THE PROCEDURE  The mammogram will be examined by a specialist (radiologist).  You may need to repeat certain parts of the test, depending on the quality of the images. This is commonly done if the radiologist needs a better view of the breast tissue.  Ask when your test results will be ready. Make sure you get your test results.  You may resume your normal activities.   This information is not intended to replace advice given to you by your health care provider. Make sure you discuss any questions you have with your health care provider.   Document Released: 09/24/2000 Document Revised: 06/18/2015 Document Reviewed: 12/06/2014 Elsevier Interactive Patient Education 2016 [employer]  

## 2015-08-28 NOTE — Telephone Encounter (Signed)
[facility] advised that Breast MRI protocol is to do MM first. Dr Melford Aase requested [place] discuss this  with patient for to address her concerns. Truman Hayward or Jeani Hawking at [facility] to contact patient to discuss.  Thank you, Leonie Douglas Referral Coordinator  [facility] [phone] ext. [phone] Fax [phone]

## 2015-08-28 NOTE — Progress Notes (Signed)
  Subjective:    Patient ID: Shelly Barrett, female    DOB: [birth year], 74 y.o.   MRN: [id number]  HPI   This very nice, but anxious 74 yo MWF with hx/o labile HTN, HLD has hx/o silicone gel implants from [place] and had her last MGM in 2012 when she showed very dense breast tissue and a rupture on the right and she underwent removal/replacement at [facility]. She was recently advised breast MRI which was approved, but she is very anxious and reluctant to proceed for concern & fear of a re-rupture and also concerns about a question of prior iodine induced allergies with hive after iv contrast media and lastly fear about lying still in a prone position for 45 minutes.  Apparently a friend researched her dilemma and has decided that she should have a breast U/S.    Patient was advised that in my opinion  an U/S alone would be insufficient to r/o early malignancy. Further to allay her fears of an allergic dye reaction that I would advise that she start Cetirizine several days before & continuing after the procedure and that also I would Rx Prednisone beginning the night before and for several days after the procedure.   Medication Sig  . ALPRAZolam (XANAX XR) 0.5 MG  Take 0.5 mg by mouth daily as needed.  . Biotin 1 MG CAPS Take by mouth.  Marland Kitchen VITAMIN D  Take 6,000 Units by mouth daily.  Marland Kitchen NASONEX  nasal spray Place 2 sprays into the nose daily.   Allergies  Allergen Reactions  . Betadine [Povidone Iodine]   . Ciprocinonide [Fluocinolone]   . Ciprofloxacin Hcl     Hives  . Flexeril [Cyclobenzaprine]   . Ibuprofen   . Naprosyn [Naproxen]   . Penicillins     REACTION: rash  . Shellfish Allergy   . Sodium Benzoate [Nutritional Supplements]   . Zyrtec [Cetirizine]    Past Medical History  Diagnosis Date  . Hyperlipidemia   . Arthritis   . Prediabetes   . Scoliosis   . Vitamin D deficiency    Past Surgical History  Procedure Laterality Date  . Breast surgery  1980, 2012    implants  . Tubal  ligation    . Tonsillectomy and adenoidectomy     Review of Systems  10 point systems review negative except as above.    Objective:   Physical Exam  BP 116/64 mmHg  Pulse 72  Temp(Src) 97.9 F (36.6 C)  Resp 16  Ht 5' 3.75" (1.619 m)  Wt 102 lb 3.2 oz (46.358 kg)  BMI 17.69 kg/m2  No formal exam was performed  As patient was in consultation for 40-45 minutes recounting (occasionally tearfully) her prior experiences with uncomfortable/painful MGM's, possible iodine allergy and fear of re-rupture.     Assessment & Plan:   1. Essential hypertension   2. Hyperlipidemia   3. Fibrocystic breast disease, unspecified laterality    Patient finally agreed to proceed with the MRI after the upcoming Thanksgiving holiday and then called back to the office with concerns about having a preliminary compression MGM before the MRI. She was advised to schedule a consultation with the radiologist to discuss her concerns.

## 2015-09-01 ENCOUNTER — Telehealth: Payer: Self-pay | Admitting: Internal Medicine

## 2015-09-01 NOTE — Telephone Encounter (Signed)
Dr Melford Aase requested that [place] speak to the patient about questions and concerns she has about a previous mammogram. They did speak with the patient and the patient agrees to have the mammogram first per radiology protocol.  Please enter an order for either a screening mammogram or a bilateral diagnostic mammogram, they said you can do either that you think is best since she has not had a mammogram since 2012.  Thank you, Shelly Barrett Referral Coordinator  [facility] [phone] ext. [phone] Fax [phone]

## 2015-09-08 ENCOUNTER — Other Ambulatory Visit: Payer: Self-pay | Admitting: Internal Medicine

## 2015-09-08 DIAGNOSIS — Z1239 Encounter for other screening for malignant neoplasm of breast: Secondary | ICD-10-CM

## 2015-09-15 ENCOUNTER — Encounter: Payer: Self-pay | Admitting: *Deleted

## 2015-09-15 ENCOUNTER — Ambulatory Visit (INDEPENDENT_AMBULATORY_CARE_PROVIDER_SITE_OTHER): Payer: Commercial Managed Care - HMO | Admitting: Physician Assistant

## 2015-09-15 VITALS — BP 122/70 | HR 92 | Temp 98.2°F | Resp 16 | Ht 63.75 in | Wt 98.0 lb

## 2015-09-15 DIAGNOSIS — R319 Hematuria, unspecified: Secondary | ICD-10-CM

## 2015-09-15 DIAGNOSIS — R300 Dysuria: Secondary | ICD-10-CM

## 2015-09-15 DIAGNOSIS — E860 Dehydration: Secondary | ICD-10-CM | POA: Diagnosis not present

## 2015-09-15 DIAGNOSIS — R109 Unspecified abdominal pain: Secondary | ICD-10-CM | POA: Diagnosis not present

## 2015-09-15 LAB — CBC WITH DIFFERENTIAL/PLATELET
BASOS ABS: 0.1 10*3/uL (ref 0.0–0.1)
Basophils Relative: 1 % (ref 0–1)
Eosinophils Absolute: 0.1 10*3/uL (ref 0.0–0.7)
Eosinophils Relative: 1 % (ref 0–5)
HEMATOCRIT: 38.9 % (ref 36.0–46.0)
HEMOGLOBIN: 12.6 g/dL (ref 12.0–15.0)
LYMPHS ABS: 1.7 10*3/uL (ref 0.7–4.0)
LYMPHS PCT: 28 % (ref 12–46)
MCH: 29.2 pg (ref 26.0–34.0)
MCHC: 32.4 g/dL (ref 30.0–36.0)
MCV: 90 fL (ref 78.0–100.0)
MONOS PCT: 8 % (ref 3–12)
MPV: 10.4 fL (ref 8.6–12.4)
Monocytes Absolute: 0.5 10*3/uL (ref 0.1–1.0)
NEUTROS ABS: 3.8 10*3/uL (ref 1.7–7.7)
NEUTROS PCT: 62 % (ref 43–77)
PLATELETS: 292 10*3/uL (ref 150–400)
RBC: 4.32 MIL/uL (ref 3.87–5.11)
RDW: 13.9 % (ref 11.5–15.5)
WBC: 6.1 10*3/uL (ref 4.0–10.5)

## 2015-09-15 LAB — HEPATIC FUNCTION PANEL
ALBUMIN: 4.4 g/dL (ref 3.6–5.1)
ALK PHOS: 92 U/L (ref 33–130)
ALT: 12 U/L (ref 6–29)
AST: 19 U/L (ref 10–35)
BILIRUBIN DIRECT: 0.1 mg/dL (ref <=0.2)
BILIRUBIN INDIRECT: 0.2 mg/dL (ref 0.2–1.2)
BILIRUBIN TOTAL: 0.3 mg/dL (ref 0.2–1.2)
Total Protein: 6.6 g/dL (ref 6.1–8.1)

## 2015-09-15 LAB — BASIC METABOLIC PANEL WITH GFR
BUN: 28 mg/dL — AB (ref 7–25)
CHLORIDE: 104 mmol/L (ref 98–110)
CO2: 29 mmol/L (ref 20–31)
Calcium: 9.4 mg/dL (ref 8.6–10.4)
Creat: 0.6 mg/dL (ref 0.60–0.93)
GFR, Est African American: >89 mL/min (ref >=60)
GFR, Est Non African American: >89 mL/min (ref >=60)
Glucose, Bld: 97 mg/dL (ref 65–99)
POTASSIUM: 4.5 mmol/L (ref 3.5–5.3)
Sodium: 140 mmol/L (ref 135–146)

## 2015-09-15 MED ORDER — SULFAMETHOXAZOLE-TRIMETHOPRIM 800-160 MG PO TABS: 1.0000 | ORAL_TABLET | Freq: Two times a day (BID) | ORAL | Status: DC

## 2015-09-15 NOTE — Progress Notes (Signed)
   Subjective:    Patient ID: Shelly Barrett, female    DOB: 05/31/1941, 74 y.o.   MRN: [id number]  HPI 74 y.o. non smoking WF presents with hematuria. She was seen in 10/20 for sinus infection, given doxycycline, states her sinuses improved but she had diarrhea from the ABX. She took imodium, which caused constipation, and took laxatives, ate bad chicken and has had diarrhea and vomiting 1 week.  She is no longer having vomiting but continues to have diarrhea. She has had some right upper and right flank. She started to see hematuria since yesterday. She has had chills without fever.  She states she has floating ribs, will see chiropractor that will put it back in and she  is afraid that her rib has "punctured" a lung. She has felt weak, dizzy with decreased appetite. She states she has been eating and drinking just not much. No NSAID use.   Blood pressure 122/70, pulse 92, temperature 98.2 F (36.8 C), temperature source Temporal, resp. rate 16, height 5' 3.75" (1.619 m), weight 98 lb (44.453 kg).  Current Outpatient Prescriptions on File Prior to Visit  Medication Sig Dispense Refill  . ALPRAZolam (XANAX XR) 0.5 MG 24 hr tablet Take 0.5 mg by mouth daily as needed.    . Biotin 1 MG CAPS Take by mouth.    . cholecalciferol (VITAMIN D) 1000 UNITS tablet Take 6,000 Units by mouth daily.     No current facility-administered medications on file prior to visit.   Past Medical History  Diagnosis Date  . Hyperlipidemia   . Arthritis   . Prediabetes   . Scoliosis   . Vitamin D deficiency     Review of Systems  Constitutional: Positive for chills, appetite change and fatigue. Negative for fever, diaphoresis and activity change.  HENT: Negative.  Negative for sore throat.   Respiratory: Negative.   Cardiovascular: Negative.   Gastrointestinal: Positive for vomiting and diarrhea. Negative for nausea, abdominal pain, constipation, blood in stool, abdominal distention, anal bleeding and rectal  pain.  Genitourinary: Positive for dysuria, urgency, frequency, hematuria and flank pain (right). Negative for decreased urine volume, vaginal bleeding, vaginal discharge, enuresis, difficulty urinating, genital sores, menstrual problem, pelvic pain and dyspareunia.  Musculoskeletal: Negative.  Negative for arthralgias.  Skin: Negative.  Negative for rash.       Objective:   Physical Exam  Constitutional: She is oriented to person, place, and time. She appears well-developed and well-nourished.  Cardiovascular: Normal rate and regular rhythm.   Pulmonary/Chest: Effort normal and breath sounds normal.  Abdominal: Soft. Bowel sounds are normal. She exhibits no distension and no mass. There is no tenderness. There is no rebound and no guarding.  + right CVA tenderness  Musculoskeletal: Normal range of motion.  + scoliosis  Neurological: She is alert and oriented to person, place, and time.  Skin: Skin is warm and dry. No rash noted.       Assessment & Plan:  Hematuria- benign AB- some right CVA tenderness ? UTI, pyelonephritis, stone, nephritic syndrome, mass Will check labs, get CT AB, allergic to contrast

## 2015-09-15 NOTE — Patient Instructions (Signed)

## 2015-09-16 LAB — URINALYSIS, ROUTINE W REFLEX MICROSCOPIC
BILIRUBIN URINE: NEGATIVE
GLUCOSE, UA: NEGATIVE
Nitrite: POSITIVE — AB
PH: 5 (ref 5.0–8.0)
SPECIFIC GRAVITY, URINE: 1.022 (ref 1.001–1.035)

## 2015-09-16 LAB — URINALYSIS, MICROSCOPIC ONLY
CASTS: NONE SEEN [LPF]
Squamous Epithelial / LPF: NONE SEEN [HPF] (ref <=5)
Yeast: NONE SEEN [HPF]

## 2015-09-16 LAB — URINE CULTURE
Colony Count: NO GROWTH
Organism ID, Bacteria: NO GROWTH

## 2015-09-19 ENCOUNTER — Ambulatory Visit
Admission: RE | Admit: 2015-09-19 | Discharge: 2015-09-19 | Disposition: A | Discharge status: Home / Self Care | Payer: Commercial Managed Care - HMO | Source: Ambulatory Visit | Attending: Physician Assistant | Admitting: Physician Assistant

## 2015-09-19 ENCOUNTER — Other Ambulatory Visit: Payer: Medicare HMO

## 2015-09-19 DIAGNOSIS — R109 Unspecified abdominal pain: Secondary | ICD-10-CM

## 2015-09-19 DIAGNOSIS — R319 Hematuria, unspecified: Secondary | ICD-10-CM

## 2015-09-24 ENCOUNTER — Other Ambulatory Visit: Payer: Self-pay | Admitting: Internal Medicine

## 2015-09-24 DIAGNOSIS — Z1231 Encounter for screening mammogram for malignant neoplasm of breast: Secondary | ICD-10-CM

## 2015-09-25 ENCOUNTER — Ambulatory Visit
Admission: RE | Admit: 2015-09-25 | Discharge: 2015-09-25 | Disposition: A | Discharge status: Home / Self Care | Payer: Commercial Managed Care - HMO | Source: Ambulatory Visit | Attending: Internal Medicine | Admitting: Internal Medicine

## 2015-09-25 DIAGNOSIS — Z1231 Encounter for screening mammogram for malignant neoplasm of breast: Secondary | ICD-10-CM

## 2015-09-29 ENCOUNTER — Ambulatory Visit: Payer: Self-pay

## 2015-09-29 ENCOUNTER — Ambulatory Visit (INDEPENDENT_AMBULATORY_CARE_PROVIDER_SITE_OTHER): Payer: Commercial Managed Care - HMO | Admitting: Internal Medicine

## 2015-09-29 ENCOUNTER — Encounter: Payer: Self-pay | Admitting: Internal Medicine

## 2015-09-29 VITALS — BP 124/60 | HR 80 | Temp 98.0°F | Resp 16 | Ht 63.75 in | Wt 100.0 lb

## 2015-09-29 DIAGNOSIS — N3001 Acute cystitis with hematuria: Secondary | ICD-10-CM | POA: Diagnosis not present

## 2015-09-29 DIAGNOSIS — H6121 Impacted cerumen, right ear: Secondary | ICD-10-CM

## 2015-09-29 NOTE — Progress Notes (Signed)
   Subjective:    Patient ID: Shelly Barrett, female    DOB: [birth year], 74 y.o.   MRN: [id number]  HPI  Patient here for recheck of urine from recent UTI.  She has finished abx and has minimal dysuria. Urine is back to normal color.  She reports that she has been having some minimal discomfort in the right side.  She reports that it is better after finishing the abx but still has it occasionally.  She also reports that she thinks that her glands are swollen in her neck.  CT abdomen reviewed and patient does appear to have evidence of bilateral kidney stones.  No obstruction was noted.  Patient reports that she is scheduled for her breast mri on 12/21 and she is doing it at [facility] office.  She is aware of the mammogram results and is concerned about another possible rupture.  Most recent silicone implants were placed in 2012.  She has had a previous rupture.   Review of Systems  Constitutional: Negative for fever, chills and fatigue.  HENT: Positive for ear pain.   Respiratory: Negative for cough, chest tightness, shortness of breath and wheezing.   Cardiovascular: Negative for chest pain and palpitations.  Gastrointestinal: Negative for nausea, vomiting, diarrhea, constipation and blood in stool.  Genitourinary: Positive for dysuria and flank pain. Negative for urgency and hematuria.       Objective:   Physical Exam  Constitutional: She is oriented to person, place, and time. She appears well-developed and well-nourished. No distress.  HENT:  Head: Normocephalic.  Mouth/Throat: Oropharynx is clear and moist. No oropharyngeal exudate.  Eyes: Conjunctivae are normal. No scleral icterus.  Neck: Normal range of motion. Neck supple. No JVD present. No thyromegaly present.  Cardiovascular: Normal rate, regular rhythm, normal heart sounds and intact distal pulses.  Exam reveals no gallop and no friction rub.   No murmur heard. Pulmonary/Chest: Effort normal and breath sounds normal. No  respiratory distress. She has no wheezes. She has no rales. She exhibits no tenderness.  Abdominal: Soft. Bowel sounds are normal. She exhibits no distension and no mass. There is no tenderness. There is no rebound, no guarding and no CVA tenderness.  Musculoskeletal: Normal range of motion.  Kyphosis and scoliosis present in thoracic spine.  Lymphadenopathy:    She has no cervical adenopathy.  Neurological: She is alert and oriented to person, place, and time.  Skin: Skin is warm and dry. She is not diaphoretic.  Psychiatric: She has a normal mood and affect. Her behavior is normal. Judgment and thought content normal.  Nursing note and vitals reviewed.   Filed Vitals:   09/29/15 1046  BP: 124/60  Pulse: 80  Temp: 98 F (36.7 C)  Resp: 16             Assessment & Plan:    1. Acute cystitis with hematuria -abx finished -recheck urine -neprholithiasis also possible culprit of hematuria  2. Mammogram results -discussed results and getting MRI -if ruptured needs to go see plastics

## 2015-09-29 NOTE — Addendum Note (Signed)
Addended by: Cagney Degrace A on: 09/29/2015 03:54 PM   Modules accepted: Orders

## 2015-09-30 LAB — URINALYSIS, MICROSCOPIC ONLY
BACTERIA UA: NONE SEEN [HPF]
CASTS: NONE SEEN [LPF]
RBC / HPF: NONE SEEN RBC/HPF (ref <=2)
SQUAMOUS EPITHELIAL / LPF: NONE SEEN [HPF] (ref <=5)
Yeast: NONE SEEN [HPF]

## 2015-09-30 LAB — URINALYSIS, ROUTINE W REFLEX MICROSCOPIC
Bilirubin Urine: NEGATIVE
Glucose, UA: NEGATIVE
Ketones, ur: NEGATIVE
LEUKOCYTES UA: NEGATIVE
NITRITE: NEGATIVE
PH: 5.5 (ref 5.0–8.0)
Protein, ur: NEGATIVE
SPECIFIC GRAVITY, URINE: 1.02 (ref 1.001–1.035)

## 2015-09-30 LAB — URINE CULTURE
Colony Count: NO GROWTH
Organism ID, Bacteria: NO GROWTH

## 2015-10-01 ENCOUNTER — Other Ambulatory Visit: Payer: Commercial Managed Care - HMO

## 2015-10-01 ENCOUNTER — Ambulatory Visit
Admission: RE | Admit: 2015-10-01 | Discharge: 2015-10-01 | Disposition: A | Discharge status: Home / Self Care | Payer: Commercial Managed Care - HMO | Source: Ambulatory Visit | Attending: Internal Medicine | Admitting: Internal Medicine

## 2015-10-01 DIAGNOSIS — R634 Abnormal weight loss: Secondary | ICD-10-CM

## 2015-10-01 MED ORDER — GADOBENATE DIMEGLUMINE 529 MG/ML IV SOLN
9.0000 mL | Freq: Once | INTRAVENOUS | Status: AC | PRN
  Administered 2015-10-01: 9 mL via INTRAVENOUS

## 2015-12-11 ENCOUNTER — Ambulatory Visit (INDEPENDENT_AMBULATORY_CARE_PROVIDER_SITE_OTHER): Payer: PPO | Admitting: Internal Medicine

## 2015-12-11 ENCOUNTER — Encounter: Payer: Self-pay | Admitting: Internal Medicine

## 2015-12-11 VITALS — BP 118/72 | HR 72 | Temp 97.7°F | Resp 16 | Ht 63.75 in | Wt 96.8 lb

## 2015-12-11 DIAGNOSIS — Z1212 Encounter for screening for malignant neoplasm of rectum: Secondary | ICD-10-CM

## 2015-12-11 DIAGNOSIS — Z0001 Encounter for general adult medical examination with abnormal findings: Secondary | ICD-10-CM

## 2015-12-11 DIAGNOSIS — R7303 Prediabetes: Secondary | ICD-10-CM | POA: Diagnosis not present

## 2015-12-11 DIAGNOSIS — D518 Other vitamin B12 deficiency anemias: Secondary | ICD-10-CM | POA: Diagnosis not present

## 2015-12-11 DIAGNOSIS — Z79899 Other long term (current) drug therapy: Secondary | ICD-10-CM | POA: Diagnosis not present

## 2015-12-11 DIAGNOSIS — D509 Iron deficiency anemia, unspecified: Secondary | ICD-10-CM

## 2015-12-11 DIAGNOSIS — E559 Vitamin D deficiency, unspecified: Secondary | ICD-10-CM | POA: Diagnosis not present

## 2015-12-11 DIAGNOSIS — I10 Essential (primary) hypertension: Secondary | ICD-10-CM | POA: Diagnosis not present

## 2015-12-11 DIAGNOSIS — E785 Hyperlipidemia, unspecified: Secondary | ICD-10-CM | POA: Diagnosis not present

## 2015-12-11 DIAGNOSIS — D519 Vitamin B12 deficiency anemia, unspecified: Secondary | ICD-10-CM | POA: Diagnosis not present

## 2015-12-11 DIAGNOSIS — R7309 Other abnormal glucose: Secondary | ICD-10-CM | POA: Diagnosis not present

## 2015-12-11 DIAGNOSIS — R6889 Other general symptoms and signs: Secondary | ICD-10-CM | POA: Diagnosis not present

## 2015-12-11 LAB — CBC WITH DIFFERENTIAL/PLATELET
BASOS ABS: 0 10*3/uL (ref 0.0–0.1)
Basophils Relative: 1 % (ref 0–1)
EOS ABS: 0.1 10*3/uL (ref 0.0–0.7)
EOS PCT: 3 % (ref 0–5)
HCT: 39.3 % (ref 36.0–46.0)
Hemoglobin: 12.5 g/dL (ref 12.0–15.0)
LYMPHS ABS: 1.3 10*3/uL (ref 0.7–4.0)
Lymphocytes Relative: 41 % (ref 12–46)
MCH: 28.9 pg (ref 26.0–34.0)
MCHC: 31.8 g/dL (ref 30.0–36.0)
MCV: 91 fL (ref 78.0–100.0)
MPV: 10.4 fL (ref 8.6–12.4)
Monocytes Absolute: 0.3 10*3/uL (ref 0.1–1.0)
Monocytes Relative: 10 % (ref 3–12)
Neutro Abs: 1.4 10*3/uL — ABNORMAL LOW (ref 1.7–7.7)
Neutrophils Relative %: 45 % (ref 43–77)
PLATELETS: 262 10*3/uL (ref 150–400)
RBC: 4.32 MIL/uL (ref 3.87–5.11)
RDW: 14 % (ref 11.5–15.5)
WBC: 3.1 10*3/uL — AB (ref 4.0–10.5)

## 2015-12-11 LAB — IRON AND TIBC
%SAT: 15 % (ref 11–50)
IRON: 59 ug/dL (ref 45–160)
TIBC: 393 ug/dL (ref 250–450)
UIBC: 334 ug/dL (ref 125–400)

## 2015-12-11 LAB — HEMOGLOBIN A1C
Hgb A1c MFr Bld: 5.7 % — ABNORMAL HIGH (ref <5.7)
Mean Plasma Glucose: 117 mg/dL — ABNORMAL HIGH (ref <117)

## 2015-12-11 LAB — BASIC METABOLIC PANEL WITH GFR
BUN: 20 mg/dL (ref 7–25)
CALCIUM: 9.5 mg/dL (ref 8.6–10.4)
CO2: 26 mmol/L (ref 20–31)
Chloride: 104 mmol/L (ref 98–110)
Creat: 0.53 mg/dL — ABNORMAL LOW (ref 0.60–0.93)
GFR, Est Non African American: >89 mL/min (ref >=60)
Glucose, Bld: 107 mg/dL — ABNORMAL HIGH (ref 65–99)
POTASSIUM: 3.6 mmol/L (ref 3.5–5.3)
Sodium: 140 mmol/L (ref 135–146)

## 2015-12-11 LAB — LIPID PANEL
CHOLESTEROL: 194 mg/dL (ref 125–200)
HDL: 103 mg/dL (ref >=46)
LDL CALC: 82 mg/dL (ref <130)
TRIGLYCERIDES: 47 mg/dL (ref <150)
Total CHOL/HDL Ratio: 1.9 Ratio (ref <=5.0)
VLDL: 9 mg/dL (ref <30)

## 2015-12-11 LAB — HEPATIC FUNCTION PANEL
ALBUMIN: 4.5 g/dL (ref 3.6–5.1)
ALK PHOS: 89 U/L (ref 33–130)
ALT: 16 U/L (ref 6–29)
AST: 26 U/L (ref 10–35)
BILIRUBIN TOTAL: 0.5 mg/dL (ref 0.2–1.2)
Bilirubin, Direct: 0.1 mg/dL (ref <=0.2)
Indirect Bilirubin: 0.4 mg/dL (ref 0.2–1.2)
Total Protein: 7 g/dL (ref 6.1–8.1)

## 2015-12-11 LAB — TSH: TSH: 1.02 mIU/L

## 2015-12-11 LAB — VITAMIN B12: Vitamin B-12: 643 pg/mL (ref 200–1100)

## 2015-12-11 LAB — MAGNESIUM: MAGNESIUM: 2 mg/dL (ref 1.5–2.5)

## 2015-12-11 NOTE — Patient Instructions (Signed)

## 2015-12-11 NOTE — Progress Notes (Signed)
Patient ID: Shelly Barrett, female   DOB: 10-Jun-1941, 75 y.o.   MRN: [id number]  Annual Screening/Preventative Visit And Comprehensive Evaluation &  Examination      This very nice 75 y.o. MWF presents for a Preventative Visit & comprehensive evaluation and management of multiple medical co-morbidities.  Patient has been followed for Labile HTN, Prediabetes, Hyperlipidemia and Vitamin D Deficiency. Patient also has Chronic Low back pain due to DDD and severe scoliosis.       Labile HTN predates since 2002. Patient's BP has been controlled at home and patient denies any cardiac symptoms as chest pain, palpitations, shortness of breath, dizziness or ankle swelling. Today's BP: 118/72 mmHg       Patient's hyperlipidemia is controlled with diet. Patient denies myalgias or other medication SE's. Last lipids were Total Chol 197, HDL 91, Trig 72 and LDL 92.       Patient has prediabetes predating since 2011 with A1c 6.0% and patient denies reactive hypoglycemic symptoms, visual blurring, diabetic polys, or paresthesias. Last A1c was 5.7% in Feb 2016.     Finally, patient has history of Vitamin D Deficiency with level of "24" in 2008  and last Vitamin D was 54 in Feb 2016.   Medication Sig  . ALPRAZolam- XR 0.5 MG 24 hr tablet Take 0.5 mg by mouth daily as needed.  . Biotin 1 MG CAPS Take by mouth.  Marland Kitchen VITAMIN D 1000 UNITS  Take 6,000 Units by mouth daily.   Allergies  Allergen Reactions  . Betadine [Povidone Iodine]   . Ciprocinonide [Fluocinolone]   . Ciprofloxacin Hcl     Hives  . Flexeril [Cyclobenzaprine]   . Ibuprofen   . Naprosyn [Naproxen]   . Penicillins     REACTION: rash  . Shellfish Allergy   . Sodium Benzoate [Nutritional Supplements]    Past Medical History  Diagnosis Date  . Hyperlipidemia   . Arthritis   . Prediabetes   . Scoliosis   . Vitamin D deficiency    Health Maintenance  Topic Date Due  . INFLUENZA VACCINE  05/11/2016  . MAMMOGRAM  09/24/2017  . COLONOSCOPY   10/17/2017  . TETANUS/TDAP  11/17/2020  . DEXA SCAN  Completed  . ZOSTAVAX  Completed  . PNA vac Low Risk Adult  Completed   Immunization History  Administered Date(s) Administered  . Influenza, High Dose Seasonal PF 07/30/2014, 06/25/2015  . Influenza-Unspecified 06/20/2013  . Pneumococcal Conjugate-13 09/18/2014  . Pneumococcal-Unspecified 09/16/2009  . Tdap 11/17/2010  . Zoster 09/16/2009   Past Surgical History  Procedure Laterality Date  . Breast surgery  1980, 2012    implants  . Tubal ligation    . Tonsillectomy and adenoidectomy     Family History  Problem Relation Age of Onset  . Heart disease Mother   . Hypertension Mother   . Heart disease Father   . Cancer Father     thoat and prostate  . Arthritis Sister     RA  . Hyperlipidemia Brother    Social History  Substance Use Topics  . Smoking status: Never Smoker   . Smokeless tobacco: Never Used  . Alcohol Use: 0.5 oz/week    1 drink(s) per week     Comment: occasionally    ROS Constitutional: Denies fever, chills, weight loss/gain, headaches, insomnia,  night sweats, and change in appetite. Does c/o fatigue. Eyes: Denies redness, blurred vision, diplopia, discharge, itchy, watery eyes.  ENT: Denies discharge, congestion, post nasal drip, epistaxis, sore  throat, earache, hearing loss, dental pain, Tinnitus, Vertigo, Sinus pain, snoring.  Cardio: Denies chest pain, palpitations, irregular heartbeat, syncope, dyspnea, diaphoresis, orthopnea, PND, claudication, edema Respiratory: denies cough, dyspnea, DOE, pleurisy, hoarseness, laryngitis, wheezing.  Gastrointestinal: Denies dysphagia, heartburn, reflux, water brash, pain, cramps, nausea, vomiting, bloating, diarrhea, constipation, hematemesis, melena, hematochezia, jaundice, hemorrhoids Genitourinary: Denies dysuria, frequency, urgency, nocturia, hesitancy, discharge, hematuria, flank pain Breast: Breast lumps, nipple discharge, bleeding.  Musculoskeletal:  Denies arthralgia, myalgia, stiffness, Jt. Swelling, pain, limp, and strain/sprain. Denies falls. Skin: Denies puritis, rash, hives, warts, acne, eczema, changing in skin lesion Neuro: No weakness, tremor, incoordination, spasms, paresthesia, pain Psychiatric: Denies confusion, memory loss, sensory loss. Denies Depression. Endocrine: Denies change in weight, skin, hair change, nocturia, and paresthesia, diabetic polys, visual blurring, hyper / hypo glycemic episodes.  Heme/Lymph: No excessive bleeding, bruising, enlarged lymph nodes.  Physical Exam  BP 118/72 mmHg  Pulse 72  Temp(Src) 97.7 F (36.5 C)  Resp 16  Ht 5' 3.75" (1.619 m)  Wt 96 lb 12.8 oz (43.908 kg)  BMI 16.75 kg/m2  General Appearance: thin  and in no apparent distress. Eyes: PERRLA, EOMs, conjunctiva no swelling or erythema, normal fundi and vessels. Sinuses: No frontal/maxillary tenderness ENT/Mouth: EACs patent / TMs  nl. Nares clear without erythema, swelling, mucoid exudates. Oral hygiene is good. No erythema, swelling, or exudate. Tongue normal, non-obstructing. Tonsils not swollen or erythematous. Hearing normal.  Neck: Supple, thyroid normal. No bruits, nodes or JVD. Respiratory: Respiratory effort normal.  BS equal and clear bilateral without rales, rhonci, wheezing or stridor. Cardio: Heart sounds are normal with regular rate and rhythm and no murmurs, rubs or gallops. Peripheral pulses are normal and equal bilaterally without edema. No aortic or femoral bruits. Chest: symmetric with normal excursions and percussion. Breasts: Symmetric with hardened implants without lumps, nipple discharge, retractions, or fibrocystic changes.  Abdomen: Flat, soft, with bowl sounds. Nontender, no guarding, rebound, hernias, masses, or organomegaly.  Lymphatics: Non tender without lymphadenopathy.  Musculoskeletal: Moderate s-shaped thoracolumbar scoliosis. Full ROM all peripheral extremities, joint stability, 5/5 strength, and  normal gait. Skin: Warm and dry without rashes, lesions, cyanosis, clubbing or  ecchymosis.  Neuro: Cranial nerves intact, reflexes equal bilaterally. Normal muscle tone, no cerebellar symptoms. Sensation intact.  Pysch: Alert and oriented X 3, normal affect, Insight and Judgment appropriate.   Assessment and Plan  1. Annual Preventative Screening Examination   2. Essential hypertension  - Microalbumin / creatinine urine ratio - EKG 12-Lead - [place], RETROPERITNL ABD,  LTD - TSH  3. Hyperlipidemia  - Lipid panel - TSH  4. Prediabetes  - Hemoglobin A1c - Insulin, random  5. Vitamin D deficiency  - VITAMIN D 25 Hydroxy   6. Screening for rectal cancer  - POC Hemoccult Bld/Stl  7. Anemia, iron deficiency  - Iron and TIBC  8. Vitamin B12 deficiency anemia  - Vitamin B12  9. Medication management  - Urinalysis, Routine w reflex microscopic - CBC with Differential/Platelet - BASIC METABOLIC PANEL WITH GFR - Hepatic function panel - Magnesium   Continue prudent diet as discussed, weight control, BP monitoring, regular exercise, and medications. Discussed med's effects and SE's. Screening labs and tests as requested with regular follow-up as recommended. Over 40 minutes of exam, counseling, chart review and high complex critical decision making was performed.

## 2015-12-12 ENCOUNTER — Other Ambulatory Visit: Payer: Self-pay | Admitting: Internal Medicine

## 2015-12-12 DIAGNOSIS — N390 Urinary tract infection, site not specified: Secondary | ICD-10-CM

## 2015-12-12 LAB — URINALYSIS, MICROSCOPIC ONLY
BACTERIA UA: NONE SEEN [HPF]
Casts: NONE SEEN [LPF]
YEAST: NONE SEEN [HPF]

## 2015-12-12 LAB — MICROALBUMIN / CREATININE URINE RATIO
CREATININE, URINE: 170 mg/dL (ref 20–320)
MICROALB UR: 28.2 mg/dL
Microalb Creat Ratio: 166 mcg/mg creat — ABNORMAL HIGH (ref <30)

## 2015-12-12 LAB — URINALYSIS, ROUTINE W REFLEX MICROSCOPIC
Bilirubin Urine: NEGATIVE
Glucose, UA: NEGATIVE
NITRITE: NEGATIVE
Specific Gravity, Urine: 1.02 (ref 1.001–1.035)
pH: 5 (ref 5.0–8.0)

## 2015-12-12 LAB — VITAMIN D 25 HYDROXY (VIT D DEFICIENCY, FRACTURES): VIT D 25 HYDROXY: 64 ng/mL (ref 30–100)

## 2015-12-12 LAB — INSULIN, RANDOM: INSULIN: 5.2 u[IU]/mL (ref 2.0–19.6)

## 2015-12-12 MED ORDER — NITROFURANTOIN MONOHYD MACRO 100 MG PO CAPS: 100.0000 mg | ORAL_CAPSULE | Freq: Two times a day (BID) | ORAL | Status: AC

## 2016-01-16 ENCOUNTER — Ambulatory Visit (INDEPENDENT_AMBULATORY_CARE_PROVIDER_SITE_OTHER): Payer: PPO

## 2016-01-16 DIAGNOSIS — R8271 Bacteriuria: Secondary | ICD-10-CM | POA: Diagnosis not present

## 2016-01-16 DIAGNOSIS — R8299 Other abnormal findings in urine: Secondary | ICD-10-CM | POA: Diagnosis not present

## 2016-01-16 NOTE — Progress Notes (Signed)
Pt presents for lab only U/A & U/C.

## 2016-01-17 LAB — URINALYSIS, ROUTINE W REFLEX MICROSCOPIC
BILIRUBIN URINE: NEGATIVE
Glucose, UA: NEGATIVE
KETONES UR: NEGATIVE
Leukocytes, UA: NEGATIVE
Nitrite: NEGATIVE
PROTEIN: NEGATIVE
Specific Gravity, Urine: 1.023 (ref 1.001–1.035)
pH: 5 (ref 5.0–8.0)

## 2016-01-17 LAB — URINALYSIS, MICROSCOPIC ONLY
Bacteria, UA: NONE SEEN [HPF]
CASTS: NONE SEEN [LPF]
Squamous Epithelial / LPF: NONE SEEN [HPF] (ref <=5)
YEAST: NONE SEEN [HPF]

## 2016-01-17 LAB — URINE CULTURE
Colony Count: NO GROWTH
Organism ID, Bacteria: NO GROWTH

## 2016-01-21 ENCOUNTER — Encounter: Payer: Self-pay | Admitting: Internal Medicine

## 2016-01-29 ENCOUNTER — Other Ambulatory Visit: Payer: Self-pay | Admitting: *Deleted

## 2016-01-29 DIAGNOSIS — H9201 Otalgia, right ear: Secondary | ICD-10-CM

## 2016-02-12 DIAGNOSIS — H6121 Impacted cerumen, right ear: Secondary | ICD-10-CM | POA: Diagnosis not present

## 2016-02-25 DIAGNOSIS — M9904 Segmental and somatic dysfunction of sacral region: Secondary | ICD-10-CM | POA: Diagnosis not present

## 2016-02-25 DIAGNOSIS — M9902 Segmental and somatic dysfunction of thoracic region: Secondary | ICD-10-CM | POA: Diagnosis not present

## 2016-02-25 DIAGNOSIS — M4125 Other idiopathic scoliosis, thoracolumbar region: Secondary | ICD-10-CM | POA: Diagnosis not present

## 2016-02-25 DIAGNOSIS — M4127 Other idiopathic scoliosis, lumbosacral region: Secondary | ICD-10-CM | POA: Diagnosis not present

## 2016-02-25 DIAGNOSIS — M9901 Segmental and somatic dysfunction of cervical region: Secondary | ICD-10-CM | POA: Diagnosis not present

## 2016-02-25 DIAGNOSIS — M4126 Other idiopathic scoliosis, lumbar region: Secondary | ICD-10-CM | POA: Diagnosis not present

## 2016-02-25 DIAGNOSIS — M9903 Segmental and somatic dysfunction of lumbar region: Secondary | ICD-10-CM | POA: Diagnosis not present

## 2016-02-25 DIAGNOSIS — M50123 Cervical disc disorder at C6-C7 level with radiculopathy: Secondary | ICD-10-CM | POA: Diagnosis not present

## 2016-03-02 DIAGNOSIS — M9901 Segmental and somatic dysfunction of cervical region: Secondary | ICD-10-CM | POA: Diagnosis not present

## 2016-03-02 DIAGNOSIS — M9903 Segmental and somatic dysfunction of lumbar region: Secondary | ICD-10-CM | POA: Diagnosis not present

## 2016-03-02 DIAGNOSIS — M545 Low back pain: Secondary | ICD-10-CM | POA: Diagnosis not present

## 2016-03-02 DIAGNOSIS — M542 Cervicalgia: Secondary | ICD-10-CM | POA: Diagnosis not present

## 2016-03-16 DIAGNOSIS — M542 Cervicalgia: Secondary | ICD-10-CM | POA: Diagnosis not present

## 2016-03-16 DIAGNOSIS — M9903 Segmental and somatic dysfunction of lumbar region: Secondary | ICD-10-CM | POA: Diagnosis not present

## 2016-03-16 DIAGNOSIS — M9901 Segmental and somatic dysfunction of cervical region: Secondary | ICD-10-CM | POA: Diagnosis not present

## 2016-03-16 DIAGNOSIS — M545 Low back pain: Secondary | ICD-10-CM | POA: Diagnosis not present

## 2016-03-19 ENCOUNTER — Ambulatory Visit (INDEPENDENT_AMBULATORY_CARE_PROVIDER_SITE_OTHER): Payer: PPO | Admitting: Internal Medicine

## 2016-03-19 ENCOUNTER — Encounter: Payer: Self-pay | Admitting: Internal Medicine

## 2016-03-19 VITALS — BP 120/64 | HR 72 | Temp 97.8°F | Resp 16 | Ht 63.75 in | Wt 97.0 lb

## 2016-03-19 DIAGNOSIS — N6012 Diffuse cystic mastopathy of left breast: Secondary | ICD-10-CM

## 2016-03-19 DIAGNOSIS — Z0000 Encounter for general adult medical examination without abnormal findings: Secondary | ICD-10-CM

## 2016-03-19 DIAGNOSIS — N6011 Diffuse cystic mastopathy of right breast: Secondary | ICD-10-CM | POA: Diagnosis not present

## 2016-03-19 DIAGNOSIS — Z79899 Other long term (current) drug therapy: Secondary | ICD-10-CM

## 2016-03-19 DIAGNOSIS — R6889 Other general symptoms and signs: Secondary | ICD-10-CM

## 2016-03-19 DIAGNOSIS — E559 Vitamin D deficiency, unspecified: Secondary | ICD-10-CM

## 2016-03-19 DIAGNOSIS — I10 Essential (primary) hypertension: Secondary | ICD-10-CM | POA: Diagnosis not present

## 2016-03-19 DIAGNOSIS — Z0001 Encounter for general adult medical examination with abnormal findings: Secondary | ICD-10-CM | POA: Diagnosis not present

## 2016-03-19 DIAGNOSIS — I6529 Occlusion and stenosis of unspecified carotid artery: Secondary | ICD-10-CM

## 2016-03-19 DIAGNOSIS — R7303 Prediabetes: Secondary | ICD-10-CM | POA: Diagnosis not present

## 2016-03-19 DIAGNOSIS — E785 Hyperlipidemia, unspecified: Secondary | ICD-10-CM

## 2016-03-19 NOTE — Progress Notes (Signed)
MEDICARE ANNUAL WELLNESS VISIT AND FOLLOW UP  Assessment:    1. Carotid stenosis, unspecified laterality -followed with yearly carotid dopplers -cholesterol at goal  2. Essential hypertension -Dash diet -cont to monitor at home -exercise as tolerated  3. Hyperlipidemia -at goal -cont diet and exercise as tolerated  4. Prediabetes -cont diet and exercise as tolerated  5. Vitamin D deficiency -cont supplement -cont calcium with it  6. Medication management -cont biannual lab testing  7. Fibrocystic breast changes, bilateral -cont yearly mammograms  8. Medicare annual wellness visit, subsequent -due next year  Over 30 minutes of exam, counseling, chart review, and critical decision making was performed Future Appointments Date Time Provider [place]  06/25/2016 8:45 AM [name], MD [facility] None  12/31/2016 9:00 AM [name], MD [facility] None     Plan:   During the course of the visit the patient was educated and counseled about appropriate screening and preventive services including:    Pneumococcal vaccine   Influenza vaccine  Td vaccine  Prevnar 13  Screening electrocardiogram  Screening mammography  Bone densitometry screening  Colorectal cancer screening  Diabetes screening  Glaucoma screening  Nutrition counseling   Advanced directives: given info/requested copies  Conditions/risks identified: Diabetes is at goal, ACE/ARB therapy: No, Reason not on Ace Inhibitor/ARB therapy:  not indicated Urinary Incontinence is not an issue: discussed non pharmacology and pharmacology options.  Fall risk: low- discussed PT, home fall assessment, medications.    Subjective:   Shelly Barrett is a 75 y.o. female who presents for Medicare Annual Wellness Visit and 3 month follow up on hypertension, prediabetes, hyperlipidemia, vitamin D def.  Date of last medicare wellness visit was 2016.  Her blood pressure has been controlled  at home, today their BP is BP: 120/64 mmHg She does workout. She denies chest pain, shortness of breath, dizziness.  She is not on cholesterol medication and denies myalgias. Her cholesterol is at goal. The cholesterol last visit was:   Lab Results  Component Value Date   CHOL 194 12/11/2015   HDL 103 12/11/2015   LDLCALC 82 12/11/2015   TRIG 47 12/11/2015   CHOLHDL 1.9 12/11/2015   She has been working on diet and exercise for her prediabetes which has been present and controlled several years b diet and exercise alone.  No evidence of polydipsia, polyphagia, or polyuria.   Lab Results  Component Value Date   HGBA1C 5.7* 12/11/2015   Last GFR Lab Results  Component Value Date   GFRNONAA >89 12/11/2015   Lab Results  Component Value Date   GFRAA >89 12/11/2015   Patient is on Vitamin D supplement. Lab Results  Component Value Date   VD25OH 64 12/11/2015     She does note that she is having some ankle pain bilaterally after rolling both ankles several months ago.  She notes that she is massaging them daily.  They are better than they were.  She has not seen an orthopedist at this time.  It is not bad enough to take tylenol or ibuprofen.    Medication Review Current Outpatient Prescriptions on File Prior to Visit  Medication Sig Dispense Refill  . ALPRAZolam (XANAX XR) 0.5 MG 24 hr tablet Take 0.5 mg by mouth daily as needed.    . Biotin 1 MG CAPS Take by mouth.    . cholecalciferol (VITAMIN D) 1000 UNITS tablet Take 6,000 Units by mouth daily.     No current facility-administered medications on file prior to  visit.    Current Problems (verified) Patient Active Problem List   Diagnosis Date Noted  . Fibrocystic breast changes, bilateral 08/28/2015  . Medication management 11/25/2013  . Hypertension   . Hyperlipidemia   . Prediabetes   . Vitamin D deficiency   . CAROTID STENOSIS 05/28/2010    Screening Tests Immunization History  Administered Date(s) Administered   . Influenza, High Dose Seasonal PF 07/30/2014, 06/25/2015  . Influenza-Unspecified 06/20/2013  . Pneumococcal Conjugate-13 09/18/2014  . Pneumococcal-Unspecified 09/16/2009  . Tdap 11/17/2010  . Zoster 09/16/2009    Preventative care: Last colonoscopy: 2009 Last mammogram: 09/2015 Last pap smear/pelvic exam: remote   DEXA:12/2014  Prior vaccinations: TD or Tdap: 2012  Influenza:2016  Pneumococcal: 2010 Prevnar13: 2015 Shingles/Zostavax: 2010  Names of Other Physician/Practitioners you currently use: 1. [facility]- here for primary care 2. Dr. Delman Cheadle, eye doctor, last visit 2016 3. Dr. Mariea Clonts, dentist, last visit 2017 Patient Care Team: [name], MD as PCP - General (Internal Medicine) Gatha Mayer, MD as Consulting Physician (Gastroenterology)  Past Surgical History  Procedure Laterality Date  . Breast surgery  1980, 2012    implants  . Tubal ligation    . Tonsillectomy and adenoidectomy     Family History  Problem Relation Age of Onset  . Heart disease Mother   . Hypertension Mother   . Heart disease Father   . Cancer Father     thoat and prostate  . Arthritis Sister     RA  . Hyperlipidemia Brother    Social History  Substance Use Topics  . Smoking status: Never Smoker   . Smokeless tobacco: Never Used  . Alcohol Use: 0.5 oz/week    1 drink(s) per week     Comment: occasionally   Allergies  Allergen Reactions  . Betadine [Povidone Iodine]   . Ciprocinonide [Fluocinolone]   . Ciprofloxacin Hcl     Hives  . Flexeril [Cyclobenzaprine]   . Ibuprofen   . Naprosyn [Naproxen]   . Penicillins     REACTION: rash  . Shellfish Allergy   . Sodium Benzoate [Nutritional Supplements]     MEDICARE WELLNESS OBJECTIVES: Tobacco use: She does not smoke.  Patient is not a former smoker. If yes, counseling given Alcohol Current alcohol use: none Diet: well balanced Physical activity: Current Exercise Habits: Home  exercise routine, Type of exercise: walking;stretching (yard work), Time (Minutes): 30, Frequency (Times/Week): 7, Weekly Exercise (Minutes/Week): 210, Intensity: Mild Cardiac risk factors: Cardiac Risk Factors include: advanced age (>36men, >95 women);sedentary lifestyle;smoking/ tobacco exposure Depression/mood screen:   Depression screen [facility] 2/9 03/19/2016  Decreased Interest 0  Down, Depressed, Hopeless 0  PHQ - 2 Score 0    ADLs:  In your present state of health, do you have any difficulty performing the following activities: 03/19/2016 12/11/2015  Hearing? N N  Vision? N N  Difficulty concentrating or making decisions? N N  Walking or climbing stairs? N N  Dressing or bathing? N N  Doing errands, shopping? N N  Preparing Food and eating ? N -  Using the Toilet? N -  In the past six months, have you accidently leaked urine? N -  Do you have problems with loss of bowel control? N -  Managing your Medications? N -  Managing your Finances? N -  Housekeeping or managing your Housekeeping? N -     Cognitive Testing  Alert? Yes  Normal Appearance?Yes  Oriented to person? Yes  Place? Yes  Time? Yes  Recall of three objects?  Yes  Can perform simple calculations? Yes  Displays appropriate judgment?Yes  Can read the correct time from a watch face?Yes  EOL planning: Does patient have an advance directive?: Yes Type of Advance Directive: Healthcare Power of Attorney, Living will Does patient want to make changes to advanced directive?: No - Patient declined Copy of advanced directive(s) in chart?: No - copy requested   Objective:   Today's Vitals   03/19/16 0850  BP: 120/64  Pulse: 72  Temp: 97.8 F (36.6 C)  TempSrc: Temporal  Resp: 16  Height: 5' 3.75" (1.619 m)  Weight: 97 lb (43.999 kg)   Body mass index is 16.79 kg/(m^2).  General appearance: alert, no distress, WD/WN,  female HEENT: normocephalic, sclerae anicteric, TMs pearly, nares patent, no discharge or erythema,  pharynx normal Oral cavity: MMM, no lesions Neck: supple, no lymphadenopathy, no thyromegaly, no masses Heart: RRR, normal S1, S2, no murmurs Lungs: CTA bilaterally, no wheezes, rhonchi, or rales Abdomen: +bs, soft, non tender, non distended, no masses, no hepatomegaly, no splenomegaly Musculoskeletal: nontender, no swelling, no obvious deformity Extremities: no edema, no cyanosis, no clubbing Pulses: 2+ symmetric, upper and lower extremities, normal cap refill Neurological: alert, oriented x 3, CN2-12 intact, strength normal upper extremities and lower extremities, sensation normal throughout, DTRs 2+ throughout, no cerebellar signs, gait normal Psychiatric: normal affect, behavior normal, pleasant  Breast: defer Gyn: defer Rectal: defer   Medicare Attestation I have personally reviewed: The patient's medical and social history Their use of alcohol, tobacco or illicit drugs Their current medications and supplements The patient's functional ability including ADLs,fall risks, home safety risks, cognitive, and hearing and visual impairment Diet and physical activities Evidence for depression or mood disorders  The patient's weight, height, BMI, and visual acuity have been recorded in the chart.  I have made referrals, counseling, and provided education to the patient based on review of the above and I have provided the patient with a written personalized care plan for preventive services.     Starlyn Skeans, PA-C   03/19/2016

## 2016-03-23 DIAGNOSIS — M9901 Segmental and somatic dysfunction of cervical region: Secondary | ICD-10-CM | POA: Diagnosis not present

## 2016-03-23 DIAGNOSIS — M9903 Segmental and somatic dysfunction of lumbar region: Secondary | ICD-10-CM | POA: Diagnosis not present

## 2016-03-23 DIAGNOSIS — M545 Low back pain: Secondary | ICD-10-CM | POA: Diagnosis not present

## 2016-03-23 DIAGNOSIS — M542 Cervicalgia: Secondary | ICD-10-CM | POA: Diagnosis not present

## 2016-03-30 ENCOUNTER — Other Ambulatory Visit: Payer: Self-pay | Admitting: Internal Medicine

## 2016-03-30 DIAGNOSIS — L600 Ingrowing nail: Secondary | ICD-10-CM

## 2016-04-06 DIAGNOSIS — M9901 Segmental and somatic dysfunction of cervical region: Secondary | ICD-10-CM | POA: Diagnosis not present

## 2016-04-06 DIAGNOSIS — M542 Cervicalgia: Secondary | ICD-10-CM | POA: Diagnosis not present

## 2016-04-06 DIAGNOSIS — M545 Low back pain: Secondary | ICD-10-CM | POA: Diagnosis not present

## 2016-04-06 DIAGNOSIS — M9903 Segmental and somatic dysfunction of lumbar region: Secondary | ICD-10-CM | POA: Diagnosis not present

## 2016-04-20 DIAGNOSIS — M542 Cervicalgia: Secondary | ICD-10-CM | POA: Diagnosis not present

## 2016-04-20 DIAGNOSIS — M545 Low back pain: Secondary | ICD-10-CM | POA: Diagnosis not present

## 2016-04-20 DIAGNOSIS — M9901 Segmental and somatic dysfunction of cervical region: Secondary | ICD-10-CM | POA: Diagnosis not present

## 2016-04-20 DIAGNOSIS — M9903 Segmental and somatic dysfunction of lumbar region: Secondary | ICD-10-CM | POA: Diagnosis not present

## 2016-04-27 ENCOUNTER — Encounter: Payer: Self-pay | Admitting: Podiatry

## 2016-04-27 ENCOUNTER — Ambulatory Visit (INDEPENDENT_AMBULATORY_CARE_PROVIDER_SITE_OTHER): Payer: PPO

## 2016-04-27 ENCOUNTER — Ambulatory Visit (INDEPENDENT_AMBULATORY_CARE_PROVIDER_SITE_OTHER): Payer: PPO | Admitting: Podiatry

## 2016-04-27 VITALS — BP 125/60 | HR 86 | Resp 16

## 2016-04-27 DIAGNOSIS — S93409A Sprain of unspecified ligament of unspecified ankle, initial encounter: Secondary | ICD-10-CM | POA: Diagnosis not present

## 2016-04-27 DIAGNOSIS — L603 Nail dystrophy: Secondary | ICD-10-CM | POA: Diagnosis not present

## 2016-04-27 DIAGNOSIS — M25579 Pain in unspecified ankle and joints of unspecified foot: Secondary | ICD-10-CM

## 2016-04-27 DIAGNOSIS — M898X9 Other specified disorders of bone, unspecified site: Secondary | ICD-10-CM

## 2016-04-27 NOTE — Progress Notes (Signed)
   Subjective:    Patient ID: Shelly Barrett, female    DOB: [birth year], 75 y.o.   MRN: [id number]  HPI: She presents today with 3 main complaints. She is concerned one about her ankles because of chronic rolling. She states that she has rolled her ankles as late as 4 months ago and they're still somewhat painful. She states that she seems to be rolling them more frequently denies any changes in her mental status or ability to maintain balance. She is also concerned about discoloration of the hallux nail plate left and ingrown corner of the hallux nail plate fibular border right and also about a small nodule on the fifth digit of the left foot. These are simply concerns that she would like to have evaluated.    Review of Systems  All other systems reviewed and are negative.      Objective:   Physical Exam: Vital signs are stable alert and oriented 3. Pulses are palpable. Neurologic sensorium is intact. Deep tendon reflexes are intact. Muscle strength is normal. Orthopedic evaluation of his result was distal to the ankle for range of motion or crepitation. She is a very thin frail 75 year old female with absolutely no body fat and her bones are not well padded. She has a prominent medial condyle of the fifth digit that is somewhat tender upon irritation of the fourth toe. I instructed her to place padding. Her ankles have good full range of motion and low lateral ankle instability at all. Radiographs taken today 3 views bilateral foot and ankle demonstrates normal osseous structure mild osteopenia and significant osteoarthritis of the midfoot and forefoot. Cutaneous evaluation demonstrates no other lesions or wounds. However she does have incurvated nail margins to the hallux nail border right and a nail dystrophy with subungual hematoma hallux left.        Assessment & Plan:  Assessment: Painful ankle sprains. Prominent painful fifth condyle left toe and painful toenails.  Plan: I debrided her  nails for her today to allow for relief of symptoms. We discussed proprioception and different shoe gear for an attempt to help refrain from twisting her ankles. I will follow-up with her in 2 months and we will discuss nail debridement versus nail removal and reevaluation her ankles at that time.

## 2016-05-04 DIAGNOSIS — M9903 Segmental and somatic dysfunction of lumbar region: Secondary | ICD-10-CM | POA: Diagnosis not present

## 2016-05-04 DIAGNOSIS — M797 Fibromyalgia: Secondary | ICD-10-CM | POA: Diagnosis not present

## 2016-05-04 DIAGNOSIS — M9901 Segmental and somatic dysfunction of cervical region: Secondary | ICD-10-CM | POA: Diagnosis not present

## 2016-05-04 DIAGNOSIS — M542 Cervicalgia: Secondary | ICD-10-CM | POA: Diagnosis not present

## 2016-05-17 ENCOUNTER — Ambulatory Visit (INDEPENDENT_AMBULATORY_CARE_PROVIDER_SITE_OTHER): Payer: PPO | Admitting: Internal Medicine

## 2016-05-17 VITALS — BP 116/66 | HR 86 | Temp 97.9°F | Wt 97.6 lb

## 2016-05-17 DIAGNOSIS — R319 Hematuria, unspecified: Secondary | ICD-10-CM

## 2016-05-17 MED ORDER — TAMSULOSIN HCL 0.4 MG PO CAPS: 0.4000 mg | ORAL_CAPSULE | Freq: Every day | ORAL | 0 refills | Status: DC

## 2016-05-17 NOTE — Progress Notes (Signed)
   Subjective:    Patient ID: Shelly Barrett, female    DOB: 1941/07/11, 75 y.o.   MRN: [id number]  HPI  Patient reports that she has been having blood in her urine for the last 2 days.  She reports that she noticed it after drinking coke.  She reports that she had bright red blood after this. She reports that she has been avoiding carbonation.  She reports that she is not having pain, she did have some discomfort on the bilateral flanks.  She reports that she does have back pain in that area which she cannot tell apart.  She reports that she has not had and fever,chills, nausea or vomiting.    Review of Systems  Constitutional: Negative for chills, fatigue and fever.  Respiratory: Negative for chest tightness and shortness of breath.   Gastrointestinal: Negative for abdominal pain, constipation, diarrhea, nausea and vomiting.  Genitourinary: Positive for flank pain and hematuria. Negative for dysuria, frequency and urgency.       Objective:   Physical Exam  Constitutional: She is oriented to person, place, and time. She appears well-developed and well-nourished. No distress.  HENT:  Head: Normocephalic.  Mouth/Throat: No oropharyngeal exudate.  Eyes: Conjunctivae are normal. No scleral icterus.  Neck: Normal range of motion. Neck supple. No JVD present. No thyromegaly present.  Cardiovascular: Normal rate, regular rhythm, normal heart sounds and intact distal pulses.  Exam reveals no gallop and no friction rub.   No murmur heard. Pulmonary/Chest: Effort normal and breath sounds normal. No respiratory distress. She has no wheezes. She has no rales. She exhibits no tenderness.  Abdominal: Soft. Bowel sounds are normal. She exhibits no distension and no mass. There is no tenderness. There is CVA tenderness. There is no rebound and no guarding.  Musculoskeletal: Normal range of motion.  Lymphadenopathy:    She has no cervical adenopathy.  Neurological: She is alert and oriented to person,  place, and time.  Skin: Skin is warm and dry. No rash noted. She is not diaphoretic. No erythema. No pallor.  Nursing note and vitals reviewed.   Vitals:   05/17/16 1555  BP: 116/66  Pulse: 86  Temp: 97.9 F (36.6 C)         Assessment & Plan:    1. Hematuria -history of renal stones -likely an exercerbation from recent dehyrdation from outdoor work -rule out infection -if infected than call in abx -may need follow-up with urology - Urinalysis, Routine w reflex microscopic (not at [facility]) - Culture, Urine

## 2016-05-17 NOTE — Patient Instructions (Signed)
Kidney Stones °Kidney stones (urolithiasis) are deposits that form inside your kidneys. The intense pain is caused by the stone moving through the urinary tract. When the stone moves, the ureter goes into spasm around the stone. The stone is usually passed in the urine.  °CAUSES  °· A disorder that makes certain neck glands produce too much parathyroid hormone (primary hyperparathyroidism). °· A buildup of uric acid crystals, similar to gout in your joints. °· Narrowing (stricture) of the ureter. °· A kidney obstruction present at birth (congenital obstruction). °· Previous surgery on the kidney or ureters. °· Numerous kidney infections. °SYMPTOMS  °· Feeling sick to your stomach (nauseous). °· Throwing up (vomiting). °· Blood in the urine (hematuria). °· Pain that usually spreads (radiates) to the groin. °· Frequency or urgency of urination. °DIAGNOSIS  °· Taking a history and physical exam. °· Blood or urine tests. °· CT scan. °· Occasionally, an examination of the inside of the urinary bladder (cystoscopy) is performed. °TREATMENT  °· Observation. °· Increasing your fluid intake. °· Extracorporeal shock wave lithotripsy--This is a noninvasive procedure that uses shock waves to break up kidney stones. °· Surgery may be needed if you have severe pain or persistent obstruction. There are various surgical procedures. Most of the procedures are performed with the use of small instruments. Only small incisions are needed to accommodate these instruments, so recovery time is minimized. °The size, location, and chemical composition are all important variables that will determine the proper choice of action for you. Talk to your health care provider to better understand your situation so that you will minimize the risk of injury to yourself and your kidney.  °HOME CARE INSTRUCTIONS  °· Drink enough water and fluids to keep your urine clear or pale yellow. This will help you to pass the stone or stone fragments. °· Strain  all urine through the provided strainer. Keep all particulate matter and stones for your health care provider to see. The stone causing the pain may be as small as a grain of salt. It is very important to use the strainer each and every time you pass your urine. The collection of your stone will allow your health care provider to analyze it and verify that a stone has actually passed. The stone analysis will often identify what you can do to reduce the incidence of recurrences. °· Only take over-the-counter or prescription medicines for pain, discomfort, or fever as directed by your health care provider. °· Keep all follow-up visits as told by your health care provider. This is important. °· Get follow-up X-rays if required. The absence of pain does not always mean that the stone has passed. It may have only stopped moving. If the urine remains completely obstructed, it can cause loss of kidney function or even complete destruction of the kidney. It is your responsibility to make sure X-rays and follow-ups are completed. Ultrasounds of the kidney can show blockages and the status of the kidney. Ultrasounds are not associated with any radiation and can be performed easily in a matter of minutes. °· Make changes to your daily diet as told by your health care provider. You may be told to: °¨ Limit the amount of salt that you eat. °¨ Eat 5 or more servings of fruits and vegetables each day. °¨ Limit the amount of meat, poultry, fish, and eggs that you eat. °· Collect a 24-hour urine sample as told by your health care provider. You may need to collect another urine sample every 6-12   months. °SEEK MEDICAL CARE IF: °· You experience pain that is progressive and unresponsive to any pain medicine you have been prescribed. °SEEK IMMEDIATE MEDICAL CARE IF:  °· Pain cannot be controlled with the prescribed medicine. °· You have a fever or shaking chills. °· The severity or intensity of pain increases over 18 hours and is not  relieved by pain medicine. °· You develop a new onset of abdominal pain. °· You feel faint or pass out. °· You are unable to urinate. °  °This information is not intended to replace advice given to you by your health care provider. Make sure you discuss any questions you have with your health care provider. °  °Document Released: 09/27/2005 Document Revised: 06/18/2015 Document Reviewed: 02/28/2013 °Elsevier Interactive Patient Education ©2016 [employer] ° °

## 2016-05-18 DIAGNOSIS — M9903 Segmental and somatic dysfunction of lumbar region: Secondary | ICD-10-CM | POA: Diagnosis not present

## 2016-05-18 DIAGNOSIS — M9901 Segmental and somatic dysfunction of cervical region: Secondary | ICD-10-CM | POA: Diagnosis not present

## 2016-05-18 DIAGNOSIS — M545 Low back pain: Secondary | ICD-10-CM | POA: Diagnosis not present

## 2016-05-18 DIAGNOSIS — M797 Fibromyalgia: Secondary | ICD-10-CM | POA: Diagnosis not present

## 2016-05-24 DIAGNOSIS — R319 Hematuria, unspecified: Secondary | ICD-10-CM | POA: Diagnosis not present

## 2016-05-25 LAB — URINALYSIS, MICROSCOPIC ONLY
Bacteria, UA: NONE SEEN [HPF]
RBC / HPF: >60 RBC/HPF — AB (ref <=2)
Squamous Epithelial / LPF: NONE SEEN [HPF] (ref <=5)
WBC UA: NONE SEEN WBC/HPF (ref <=5)
Yeast: NONE SEEN [HPF]

## 2016-05-25 LAB — URINALYSIS, ROUTINE W REFLEX MICROSCOPIC
Bilirubin Urine: NEGATIVE
Glucose, UA: NEGATIVE
KETONES UR: NEGATIVE
NITRITE: NEGATIVE
Specific Gravity, Urine: 1.012 (ref 1.001–1.035)
pH: 5 (ref 5.0–8.0)

## 2016-05-25 LAB — URINE CULTURE: ORGANISM ID, BACTERIA: NO GROWTH

## 2016-05-30 IMAGING — CT CT ABD-PELV W/O CM
2 of 4 series · 13 of 36 positions shown, 19 images · non-contrast
Comparison: None.

CLINICAL DATA: Right flank pain with hematuria

EXAM:
CT ABDOMEN AND PELVIS WITHOUT CONTRAST
TECHNIQUE: Multidetector CT imaging of the abdomen and pelvis was performed
following the standard protocol without IV contrast.

[Series 601: coronal body · coronal · 0.73mm/px · 1 of 105 slices shown, 2 images]
[im 35/105  soft-tissue]
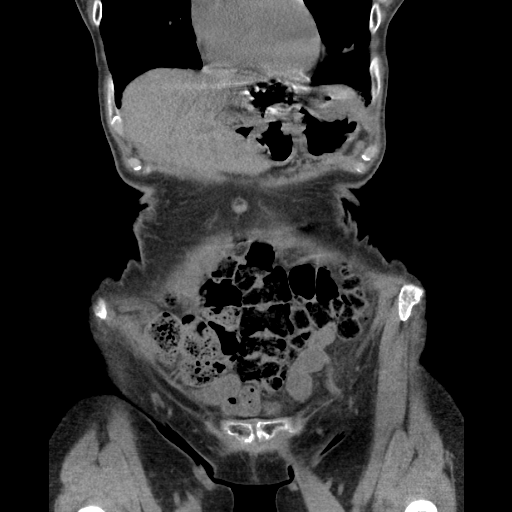
[im 35/105  bone]
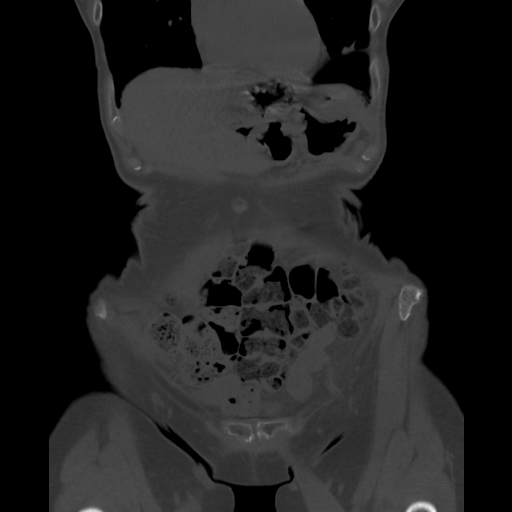

[Series 602: sagittal body · sagittal · 0.73mm/px · 12 of 126 slices shown, 17 images]
[im 8/126  soft-tissue]
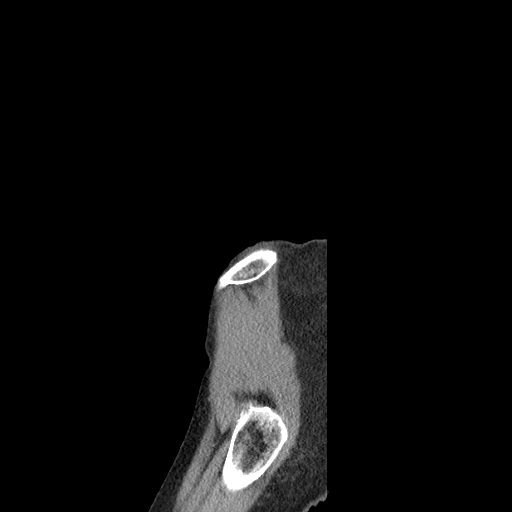
[im 8/126  lung]
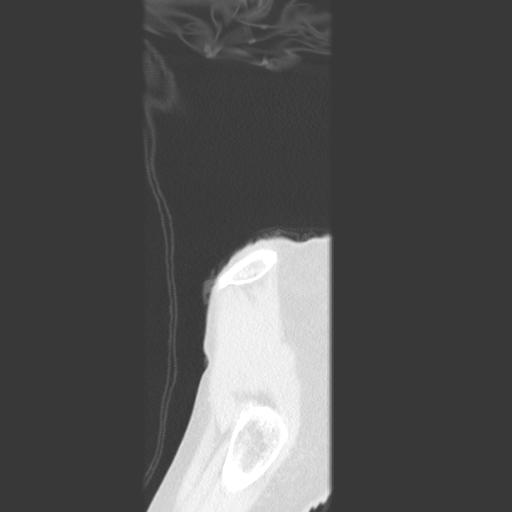
[im 8/126  bone]
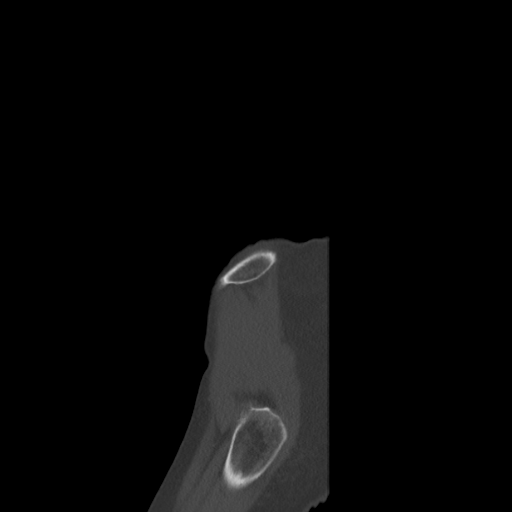
[im 15/126  lung]
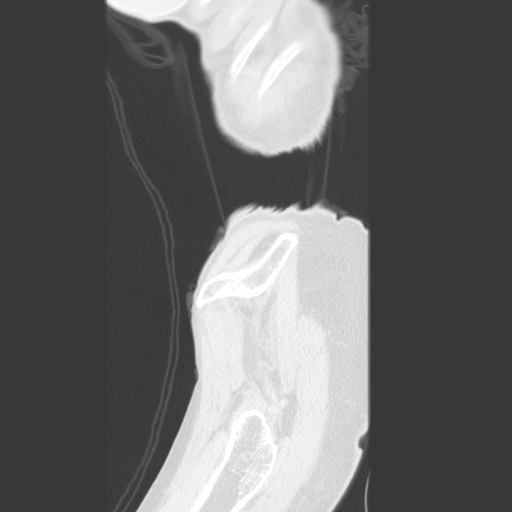
[im 23/126  soft-tissue]
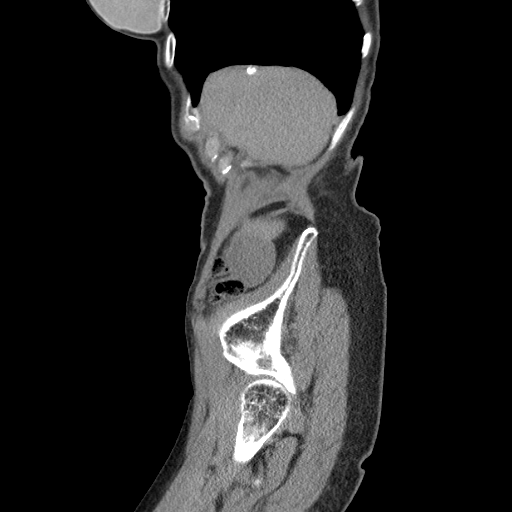
[im 23/126  lung]
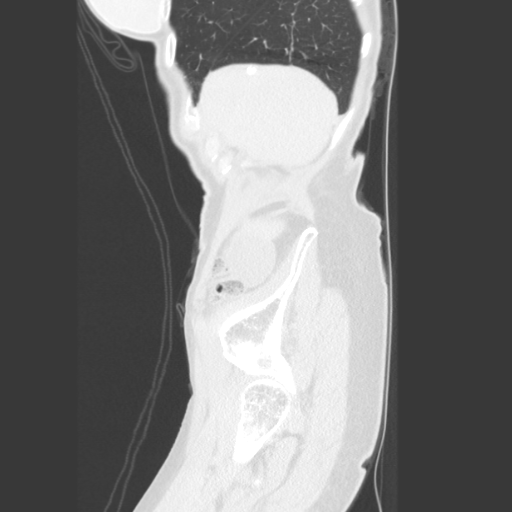
[im 30/126  soft-tissue]
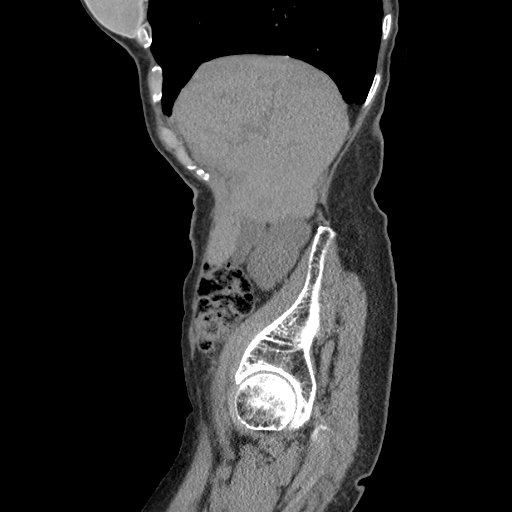
[im 30/126  lung]
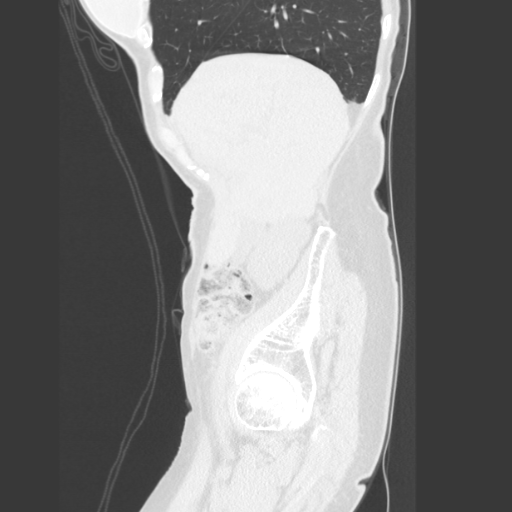
[im 45/126  soft-tissue]
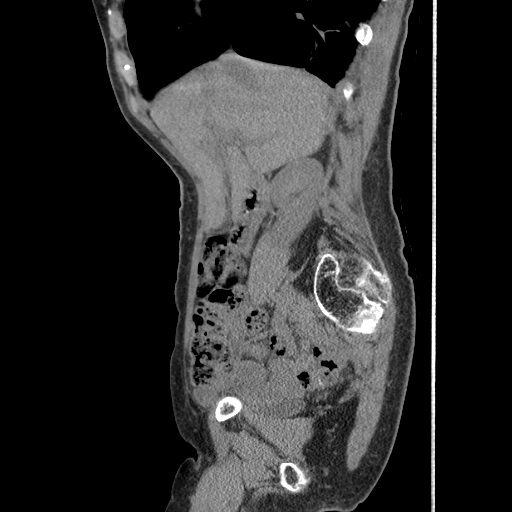
[im 52/126  soft-tissue]
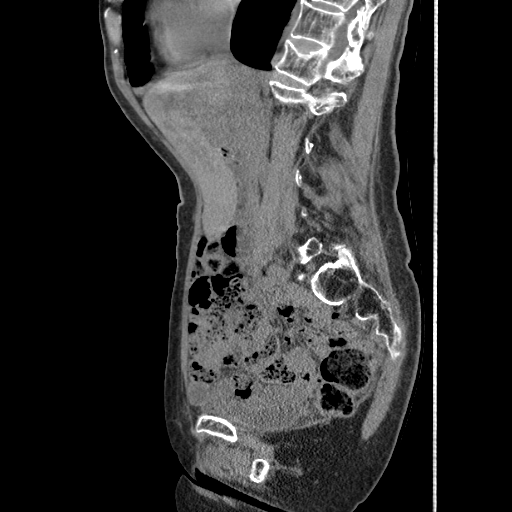
[im 67/126  soft-tissue]
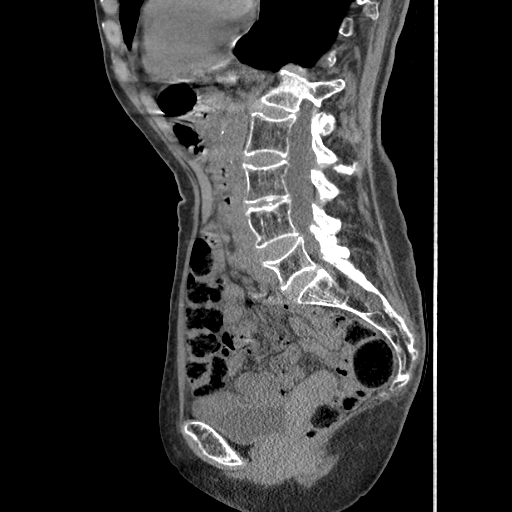
[im 74/126  soft-tissue]
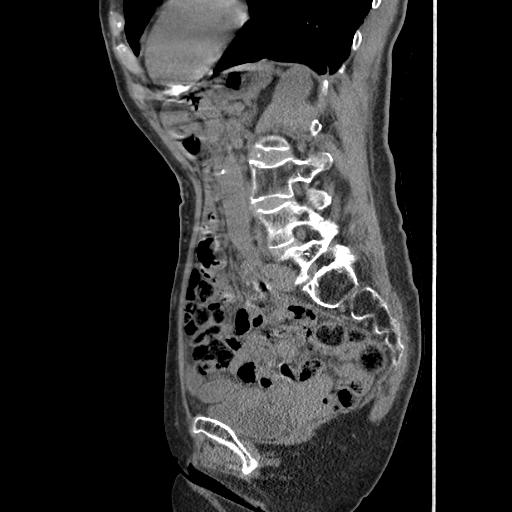
[im 81/126  soft-tissue]
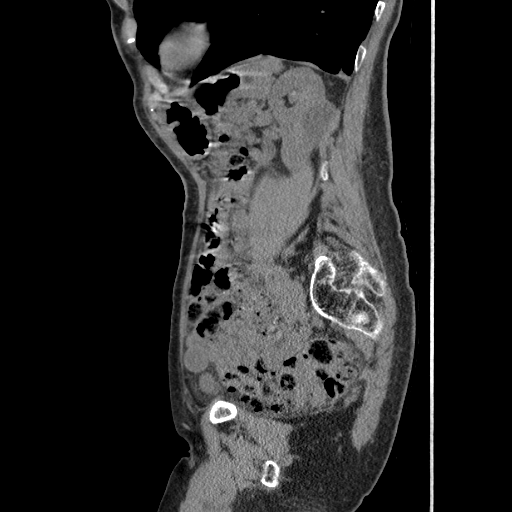
[im 96/126  soft-tissue]
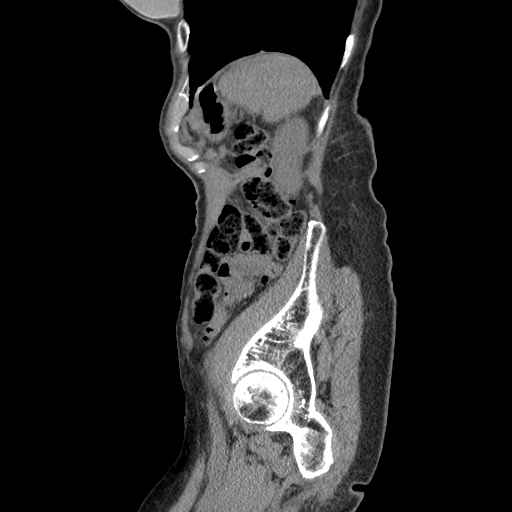
[im 96/126  bone]
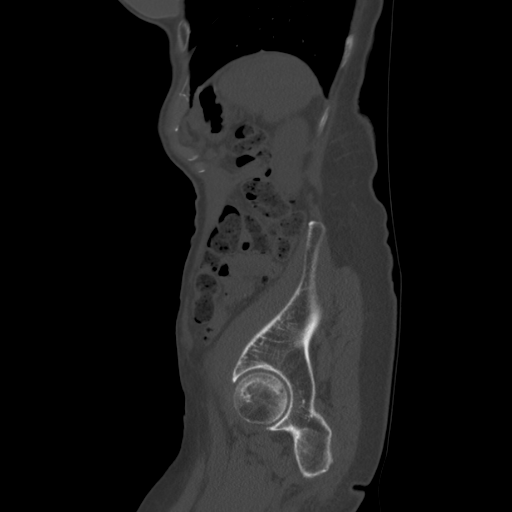
[im 103/126  soft-tissue]
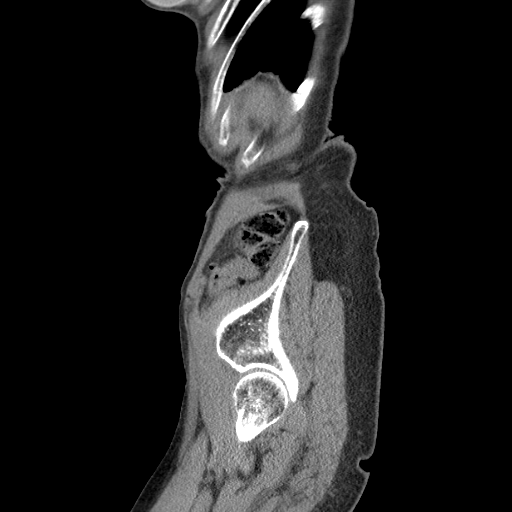
[im 118/126  soft-tissue]
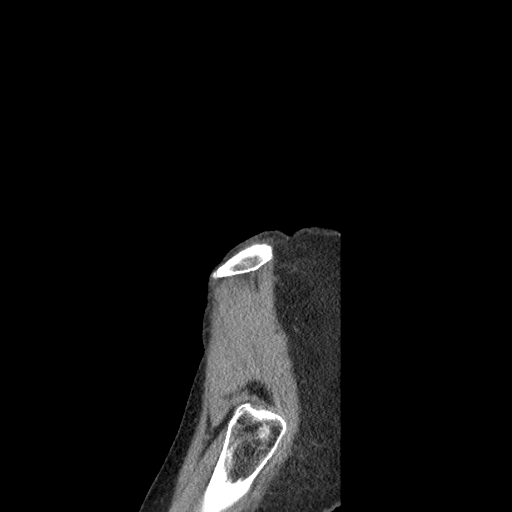

[13 of 36 positions shown; findings below may reference images not displayed]

FINDINGS: Lower chest: Linear scarring in the lung bases bilaterally. No
infiltrate or effusion. Heart size normal.

Hepatobiliary: Liver is normal in size and contour. No liver mass.
Gallbladder and bile ducts normal.

Pancreas: Pancreatic atrophy without mass or edema or calcification

Spleen: Negative

Adrenals/Urinary Tract: Small bilateral renal calculi. Negative for
renal obstruction. No ureteral calculi. 3.5 cm left upper pole renal
cyst. No renal mass lesion. Urinary bladder normal.

Stomach/Bowel: Negative for bowel obstruction. No bowel edema or
mass lesion.

Vascular/Lymphatic: Mild atherosclerotic disease in the abdominal
aorta. No aneurysm. Negative for lymphadenopathy.

Reproductive: Uterus is retroverted and normal in size. No pelvic
mass

Other: Negative for free fluid

Musculoskeletal: Moderate levoscoliosis. Compression fractures T12
and L5 of indeterminate age but most likely chronic. Correlate with
any acute spinal pain. Disc degeneration and spurring on the right
L2-3 and L3-4 related to scoliosis.
IMPRESSION: Bilateral renal calculi without renal obstruction or ureteral stone.

Levoscoliosis and degenerative change. Compression fractures T12 and
L5 of indeterminate age but probably chronic.

## 2016-06-03 DIAGNOSIS — M545 Low back pain: Secondary | ICD-10-CM | POA: Diagnosis not present

## 2016-06-03 DIAGNOSIS — M542 Cervicalgia: Secondary | ICD-10-CM | POA: Diagnosis not present

## 2016-06-03 DIAGNOSIS — M9901 Segmental and somatic dysfunction of cervical region: Secondary | ICD-10-CM | POA: Diagnosis not present

## 2016-06-03 DIAGNOSIS — M9903 Segmental and somatic dysfunction of lumbar region: Secondary | ICD-10-CM | POA: Diagnosis not present

## 2016-06-17 DIAGNOSIS — M9903 Segmental and somatic dysfunction of lumbar region: Secondary | ICD-10-CM | POA: Diagnosis not present

## 2016-06-17 DIAGNOSIS — M797 Fibromyalgia: Secondary | ICD-10-CM | POA: Diagnosis not present

## 2016-06-17 DIAGNOSIS — M9901 Segmental and somatic dysfunction of cervical region: Secondary | ICD-10-CM | POA: Diagnosis not present

## 2016-06-17 DIAGNOSIS — M545 Low back pain: Secondary | ICD-10-CM | POA: Diagnosis not present

## 2016-06-18 ENCOUNTER — Encounter: Payer: Self-pay | Admitting: Internal Medicine

## 2016-06-18 ENCOUNTER — Ambulatory Visit (INDEPENDENT_AMBULATORY_CARE_PROVIDER_SITE_OTHER): Payer: PPO | Admitting: Internal Medicine

## 2016-06-18 VITALS — BP 112/56 | HR 80 | Temp 97.6°F | Resp 16 | Ht 63.75 in | Wt 97.2 lb

## 2016-06-18 DIAGNOSIS — A084 Viral intestinal infection, unspecified: Secondary | ICD-10-CM | POA: Diagnosis not present

## 2016-06-18 MED ORDER — HYOSCYAMINE SULFATE 0.125 MG SL SUBL: SUBLINGUAL_TABLET | SUBLINGUAL | 0 refills | Status: DC

## 2016-06-18 NOTE — Progress Notes (Signed)
  Subjective:    Patient ID: Shelly Barrett, female    DOB: October 29, 1940, 75 y.o.   MRN: [id number]  HPI  This delightful 75 yo MWF was feeling well until after awakening this am she developed chills/rigors, no fever, nausea and  rt sided lower back pain along with concomitant abdominal cramping and diarrhea. Reports 1st BM was normal, but subsequently became less formed to the point of being watery. Drenies any evident blood in BM's.  Outpatient Medications Prior to Visit  Medication Sig Dispense Refill  . ALPRAZolam (XANAX XR) 0.5 MG 24 hr tablet Take 0.5 mg by mouth daily as needed.    Marland Kitchen aspirin 81 MG tablet Take 81 mg by mouth daily.    . Biotin 1 MG CAPS Take by mouth.    . cholecalciferol (VITAMIN D) 1000 UNITS tablet Take 6,000 Units by mouth daily.    . tamsulosin (FLOMAX) 0.4 MG CAPS capsule Take 1 capsule (0.4 mg total) by mouth daily. 30 capsule 0   No facility-administered medications prior to visit.    Allergies  Allergen Reactions  . Betadine [Povidone Iodine]   . Ciprocinonide [Fluocinolone]   . Ciprofloxacin Hcl     Hives  . Flexeril [Cyclobenzaprine]   . Ibuprofen   . Naprosyn [Naproxen]   . Penicillins     REACTION: rash  . Shellfish Allergy   . Sodium Benzoate [Nutritional Supplements]    Past Medical History:  Diagnosis Date  . Arthritis   . Hyperlipidemia   . Prediabetes   . Scoliosis   . Vitamin D deficiency    Past Surgical History:  Procedure Laterality Date  . [place], 2012   implants  . TONSILLECTOMY AND ADENOIDECTOMY    . TUBAL LIGATION     Review of Systems  10 point systems review negative except as above.    Objective:   Physical Exam  BP (!) 112/56   Pulse 80   Temp 97.6 F (36.4 C)   Resp 16   Ht 5' 3.75" (1.619 m)   Wt 97 lb 3.2 oz (44.1 kg)   BMI 16.82 kg/m   In no obvious distress. Exposed skin is w/o rash.   HEENT - Eac's patent. TM's Nl. EOM's full. PERRLA. NasoOroPharynx clear. Neck - supple. Nl Thyroid.  Carotids 2+ & No bruits, nodes, JVD Chest - Clear equal BS w/o Rales, rhonchi, wheezes. Cor - Nl HS. RRR w/o sig MGR. PP 1(+). No edema. Abd - No palpable organomegaly, masses, guarding, rebound  or tenderness. BS hyperactive.  MS- FROM w/o deformities. Muscle power, tone and bulk Nl. Gait Nl. Neuro - No obvious Cr N abnormalities. Sensory, motor and Cerebellar functions appear Nl w/o focal abnormalities.    Assessment & Plan:   1. Viral gastroenteritis  - hyoscyamine (LEVSIN SL) 0.125 MG SL tablet; Take 1 to 2 tablets 3 to 4 x day if needed for Nausea, vomiting, cramping or diarrhea  Dispense: 30 tablet;  -Diet discussed . Discussed if sx's worsened or changed to call or go to ER.

## 2016-06-18 NOTE — Patient Instructions (Signed)
Viral Gastroenteritis Viral gastroenteritis is also known as stomach flu. This condition affects the stomach and intestinal tract. It can cause sudden diarrhea and vomiting. The illness typically lasts 3 to 8 days. Most people develop an immune response that eventually gets rid of the virus. While this natural response develops, the virus can make you quite ill. CAUSES  Many different viruses can cause gastroenteritis, such as rotavirus or noroviruses. You can catch one of these viruses by consuming contaminated food or water. You may also catch a virus by sharing utensils or other personal items with an infected person or by touching a contaminated surface. SYMPTOMS  The most common symptoms are diarrhea and vomiting. These problems can cause a severe loss of body fluids (dehydration) and a body salt (electrolyte) imbalance. Other symptoms may include:  Fever.  Headache.  Fatigue.  Abdominal pain. DIAGNOSIS  Your caregiver can usually diagnose viral gastroenteritis based on your symptoms and a physical exam. A stool sample may also be taken to test for the presence of viruses or other infections. TREATMENT  This illness typically goes away on its own. Treatments are aimed at rehydration. The most serious cases of viral gastroenteritis involve vomiting so severely that you are not able to keep fluids down. In these cases, fluids must be given through an intravenous line (IV). HOME CARE INSTRUCTIONS   Drink enough fluids to keep your urine clear or pale yellow. Drink small amounts of fluids frequently and increase the amounts as tolerated.  Ask your caregiver for specific rehydration instructions.  Avoid:  Foods high in sugar.  Alcohol.  Carbonated drinks.  Tobacco.  Juice.  Caffeine drinks.  Extremely hot or cold fluids.  Fatty, greasy foods.  Too much intake of anything at one time.  Dairy products until 24 to 48 hours after diarrhea stops.  You may consume probiotics.  Probiotics are active cultures of beneficial bacteria. They may lessen the amount and number of diarrheal stools in adults. Probiotics can be found in yogurt with active cultures and in supplements.  Wash your hands well to avoid spreading the virus.  Only take over-the-counter or prescription medicines for pain, discomfort, or fever as directed by your caregiver.   Ask your caregiver if you should continue to take your regular prescribed and over-the-counter medicines.  Keep all follow-up appointments as directed by your caregiver. SEEK IMMEDIATE MEDICAL CARE IF:   You are unable to keep fluids down.  You do not urinate at least once every 6 to 8 hours.  You develop shortness of breath.  You notice blood in your stool or vomit. This may look like coffee grounds.  You have abdominal pain that increases or is concentrated in one small area (localized).  You have persistent vomiting or diarrhea.  Understand these instructions.  Will watch your condition.  Will get help right away if you are not doing well or get worse.

## 2016-06-23 ENCOUNTER — Ambulatory Visit (INDEPENDENT_AMBULATORY_CARE_PROVIDER_SITE_OTHER): Payer: PPO | Admitting: Internal Medicine

## 2016-06-23 ENCOUNTER — Encounter: Payer: Self-pay | Admitting: Internal Medicine

## 2016-06-23 VITALS — BP 116/66 | HR 84 | Temp 97.7°F | Resp 16 | Ht 63.75 in | Wt 97.2 lb

## 2016-06-23 DIAGNOSIS — I10 Essential (primary) hypertension: Secondary | ICD-10-CM | POA: Diagnosis not present

## 2016-06-23 DIAGNOSIS — Z79899 Other long term (current) drug therapy: Secondary | ICD-10-CM | POA: Diagnosis not present

## 2016-06-23 DIAGNOSIS — E559 Vitamin D deficiency, unspecified: Secondary | ICD-10-CM | POA: Diagnosis not present

## 2016-06-23 DIAGNOSIS — Z87448 Personal history of other diseases of urinary system: Secondary | ICD-10-CM

## 2016-06-23 DIAGNOSIS — N390 Urinary tract infection, site not specified: Secondary | ICD-10-CM

## 2016-06-23 DIAGNOSIS — E785 Hyperlipidemia, unspecified: Secondary | ICD-10-CM

## 2016-06-23 DIAGNOSIS — R7303 Prediabetes: Secondary | ICD-10-CM | POA: Diagnosis not present

## 2016-06-23 LAB — CBC WITH DIFFERENTIAL/PLATELET
BASOS ABS: 0 {cells}/uL (ref 0–200)
Basophils Relative: 0 %
Eosinophils Absolute: 148 cells/uL (ref 15–500)
Eosinophils Relative: 4 %
HEMATOCRIT: 36.8 % (ref 35.0–45.0)
HEMOGLOBIN: 12.1 g/dL (ref 11.7–15.5)
LYMPHS PCT: 31 %
Lymphs Abs: 1147 cells/uL (ref 850–3900)
MCH: 29.7 pg (ref 27.0–33.0)
MCHC: 32.9 g/dL (ref 32.0–36.0)
MCV: 90.2 fL (ref 80.0–100.0)
MONO ABS: 333 {cells}/uL (ref 200–950)
MPV: 10 fL (ref 7.5–12.5)
Monocytes Relative: 9 %
NEUTROS PCT: 56 %
Neutro Abs: 2072 cells/uL (ref 1500–7800)
Platelets: 245 10*3/uL (ref 140–400)
RBC: 4.08 MIL/uL (ref 3.80–5.10)
RDW: 13.6 % (ref 11.0–15.0)
WBC: 3.7 10*3/uL — AB (ref 3.8–10.8)

## 2016-06-23 NOTE — Progress Notes (Signed)
[place] [facility]                       [name], M.D.        Uvaldo Bristle. Silverio Lay, P.A.-C       Starlyn Skeans, P.A.-C  [facility]                [address] [place] [id number] Telephone [phone] Telefax [phone] ______________________________________________________________________     This very nice 75 y.o. MWF presents for 6 month follow up with Hypertension, Hyperlipidemia, Pre-Diabetes and Vitamin D Deficiency.      Patient was also seen about 5 days ago with c/o abdominal cramping, bloating, mild diarrhea and very vague generalized abdominal discomfort which resolved since yesterday and recent labs collected were unremarkable. She also relates 2 episodes of apparent hematuria in the last couple months and U/C's were negative. She alo reports today that she's having mild sx's of urinary burning and is concerned with regards to her recent hematuria and generalized abdominal discomfort that there may be some underling pathology as a cause of her signs & sx's.      Patient is followed expectantly with hx/o labile HTN predating circa 2002  & BP has been controlled at home. Today's BP is  116/66. Patient has had no complaints of any cardiac type chest pain, palpitations, dyspnea/orthopnea/PND, dizziness, claudication, or dependent edema.     Hyperlipidemia is controlled with diet & meds. Patient denies myalgias or other med SE's. Last Lipids were at goal:  Lab Results  Component Value Date   CHOL 194 12/11/2015   HDL 103 12/11/2015   LDLCALC 82 12/11/2015   TRIG 47 12/11/2015   CHOLHDL 1.9 12/11/2015      Also, the patient has history of PreDiabetes with A1c 6.0% in 2011 and has had no symptoms of reactive hypoglycemia, diabetic polys, paresthesias or visual blurring.  Last A1c was near goal: Lab Results  Component Value Date   HGBA1C 5.7 (H) 12/11/2015      Further, the patient also  has history of Vitamin D Deficiency in 2008 with a very low level of "24"  and supplements vitamin D without any suspected side-effects. Last vitamin D was at goal: Lab Results  Component Value Date   VD25OH 64 12/11/2015   Current Outpatient Prescriptions on File Prior to Visit  Medication Sig  . ALPRAZolam (XANAX XR) 0.5 MG 24 hr tablet Take 0.5 mg by mouth daily as needed.  Marland Kitchen aspirin 81 MG tablet Take 81 mg by mouth daily.  . Biotin 1 MG CAPS Take by mouth.  Marland Kitchen VITAMIN D 1000 UNITS tablet Take 6,000 Units by mouth daily.  Marland Kitchen Hyoscyamine SL 0.125 MG SL tablet Take 1 to 2 tablets 3 to 4 x day if needed for Nausea, vomiting, cramping or diarrhea   Allergies  Allergen Reactions  . Betadine [Povidone Iodine]   . Ciprocinonide [Fluocinolone]   . Ciprofloxacin Hcl     Hives  . Flexeril [Cyclobenzaprine]   . Ibuprofen   . Naprosyn [Naproxen]   . Penicillins     REACTION: rash  . Shellfish Allergy   . Sodium Benzoate [Nutritional Supplements]    PMHx:   Past Medical History:  Diagnosis Date  . Arthritis   . Hyperlipidemia   . Prediabetes   . Scoliosis   . Vitamin  D deficiency    Immunization History  Administered Date(s) Administered  . Influenza, High Dose Seasonal PF 07/30/2014, 06/25/2015  . Influenza-Unspecified 06/20/2013  . Pneumococcal Conjugate-13 09/18/2014  . Pneumococcal-Unspecified 09/16/2009  . Tdap 11/17/2010  . Zoster 09/16/2009   Past Surgical History:  Procedure Laterality Date  . [place], 2012   implants  . TONSILLECTOMY AND ADENOIDECTOMY    . TUBAL LIGATION     FHx:    Reviewed / unchanged  SHx:    Reviewed / unchanged  Systems Review:  Constitutional: Denies fever, chills, wt changes, headaches, insomnia, fatigue, night sweats, change in appetite. Eyes: Denies redness, blurred vision, diplopia, discharge, itchy, watery eyes.  ENT: Denies discharge, congestion, post nasal drip, epistaxis, sore throat, earache, hearing loss, dental  pain, tinnitus, vertigo, sinus pain, snoring.  CV: Denies chest pain, palpitations, irregular heartbeat, syncope, dyspnea, diaphoresis, orthopnea, PND, claudication or edema. Respiratory: denies cough, dyspnea, DOE, pleurisy, hoarseness, laryngitis, wheezing.  Gastrointestinal: Denies dysphagia, odynophagia, heartburn, reflux, water brash, abdominal pain or cramps, nausea, vomiting, bloating, diarrhea, constipation, hematemesis, melena, hematochezia  or hemorrhoids. Genitourinary: Denies dysuria, frequency, urgency, nocturia, hesitancy, discharge, hematuria or flank pain. Musculoskeletal: Denies arthralgias, myalgias, stiffness, jt. swelling, pain, limping or strain/sprain.  Skin: Denies pruritus, rash, hives, warts, acne, eczema or change in skin lesion(s). Neuro: No weakness, tremor, incoordination, spasms, paresthesia or pain. Psychiatric: Denies confusion, memory loss or sensory loss. Endo: Denies change in weight, skin or hair change.  Heme/Lymph: No excessive bleeding, bruising or enlarged lymph nodes.  Physical Exam BP 116/66   Pulse 84   Temp 97.7 F (36.5 C)   Resp 16   Ht 5' 3.75" (1.619 m)   Wt 97 lb 3.2 oz (44.1 kg)   BMI 16.82 kg/m   Appears well nourished and in no distress.  Eyes: PERRLA, EOMs, conjunctiva no swelling or erythema. Sinuses: No frontal/maxillary tenderness ENT/Mouth: EAC's clear, TM's nl w/o erythema, bulging. Nares clear w/o erythema, swelling, exudates. Oropharynx clear without erythema or exudates. Oral hygiene is good. Tongue normal, non obstructing. Hearing intact.  Neck: Supple. Thyroid nl. Car 2+/2+ without bruits, nodes or JVD. Chest: Respirations nl with BS clear & equal w/o rales, rhonchi, wheezing or stridor.  Cor: Heart sounds normal w/ regular rate and rhythm without sig. murmurs, gallops, clicks, or rubs. Peripheral pulses normal and equal  without edema.  Abdomen: Soft & bowel sounds normal. Non-tender w/o guarding, rebound, hernias, masses,  or organomegaly.  Lymphatics: Unremarkable.  Musculoskeletal: Full ROM all peripheral extremities, joint stability, 5/5 strength, and normal gait.  Skin: Warm, dry without exposed rashes, lesions or ecchymosis apparent.  Neuro: Cranial nerves intact, reflexes equal bilaterally. Sensory-motor testing grossly intact. Tendon reflexes grossly intact.  Pysch: Alert & oriented x 3.  Insight and judgement nl & appropriate. No ideations.  Assessment and Plan:  1. Essential hypertension  - Continue medication, monitor blood pressure at home. Continue DASH diet. Reminder to go to the ER if any CP, SOB, nausea, dizziness, severe HA, changes vision/speech, left arm numbness and tingling and jaw pain. - TSH  2. Hyperlipidemia  - Continue diet/meds, exercise,& lifestyle modifications. Continue monitor periodic cholesterol/liver & renal functions  - Lipid panel - TSH  3. Prediabetes  - Continue diet, exercise, lifestyle modifications. Monitor appropriate labs. - Hemoglobin A1c - Insulin, random  4. Vitamin D deficiency  - Continue supplementation. - VITAMIN D 25 Hydroxy   5. Urinary tract infection, site not specified  - Urinalysis, Routine w reflex microscopic  -  Urine culture  6. History of hematuria  - Ambulatory referral to Urology  7. Medication management  - CBC with Differential/Platelet - BASIC METABOLIC PANEL WITH GFR - Hepatic function panel - Magnesium   Recommended regular exercise, BP monitoring, weight control, and discussed med and SE's. Recommended labs to assess and monitor clinical status. Further disposition pending results of labs. Over 30 minutes of exam, counseling, chart review was performed

## 2016-06-23 NOTE — Patient Instructions (Signed)

## 2016-06-24 LAB — URINALYSIS, ROUTINE W REFLEX MICROSCOPIC
Bilirubin Urine: NEGATIVE
GLUCOSE, UA: NEGATIVE
KETONES UR: NEGATIVE
Leukocytes, UA: NEGATIVE
Nitrite: NEGATIVE
PH: 5.5 (ref 5.0–8.0)
SPECIFIC GRAVITY, URINE: 1.013 (ref 1.001–1.035)

## 2016-06-24 LAB — LIPID PANEL
CHOLESTEROL: 185 mg/dL (ref 125–200)
HDL: 91 mg/dL (ref >=46)
LDL Cholesterol: 80 mg/dL (ref <130)
TRIGLYCERIDES: 69 mg/dL (ref <150)
Total CHOL/HDL Ratio: 2 Ratio (ref <=5.0)
VLDL: 14 mg/dL (ref <30)

## 2016-06-24 LAB — INSULIN, RANDOM: INSULIN: 5.1 u[IU]/mL (ref 2.0–19.6)

## 2016-06-24 LAB — TSH: TSH: 0.82 mIU/L

## 2016-06-24 LAB — BASIC METABOLIC PANEL WITH GFR
BUN: 13 mg/dL (ref 7–25)
CALCIUM: 9.2 mg/dL (ref 8.6–10.4)
CO2: 26 mmol/L (ref 20–31)
CREATININE: 0.59 mg/dL — AB (ref 0.60–0.93)
Chloride: 104 mmol/L (ref 98–110)
GLUCOSE: 94 mg/dL (ref 65–99)
Potassium: 3.5 mmol/L (ref 3.5–5.3)
Sodium: 140 mmol/L (ref 135–146)

## 2016-06-24 LAB — HEPATIC FUNCTION PANEL
ALBUMIN: 4.3 g/dL (ref 3.6–5.1)
ALT: 15 U/L (ref 6–29)
AST: 22 U/L (ref 10–35)
Alkaline Phosphatase: 105 U/L (ref 33–130)
Bilirubin, Direct: 0.1 mg/dL (ref <=0.2)
Indirect Bilirubin: 0.4 mg/dL (ref 0.2–1.2)
TOTAL PROTEIN: 6.7 g/dL (ref 6.1–8.1)
Total Bilirubin: 0.5 mg/dL (ref 0.2–1.2)

## 2016-06-24 LAB — VITAMIN D 25 HYDROXY (VIT D DEFICIENCY, FRACTURES): Vit D, 25-Hydroxy: 62 ng/mL (ref 30–100)

## 2016-06-24 LAB — URINALYSIS, MICROSCOPIC ONLY
BACTERIA UA: NONE SEEN [HPF]
CRYSTALS: NONE SEEN [HPF]
Casts: NONE SEEN [LPF]
Squamous Epithelial / LPF: NONE SEEN [HPF] (ref <=5)
Yeast: NONE SEEN [HPF]

## 2016-06-24 LAB — HEMOGLOBIN A1C
HEMOGLOBIN A1C: 5.4 % (ref <5.7)
MEAN PLASMA GLUCOSE: 108 mg/dL

## 2016-06-24 LAB — URINE CULTURE: Organism ID, Bacteria: NO GROWTH

## 2016-06-24 LAB — MAGNESIUM: MAGNESIUM: 2.1 mg/dL (ref 1.5–2.5)

## 2016-06-25 ENCOUNTER — Ambulatory Visit: Payer: Self-pay | Admitting: Internal Medicine

## 2016-06-29 ENCOUNTER — Ambulatory Visit (INDEPENDENT_AMBULATORY_CARE_PROVIDER_SITE_OTHER): Payer: PPO | Admitting: Podiatry

## 2016-06-29 DIAGNOSIS — M79676 Pain in unspecified toe(s): Secondary | ICD-10-CM | POA: Diagnosis not present

## 2016-06-29 DIAGNOSIS — L603 Nail dystrophy: Secondary | ICD-10-CM

## 2016-06-29 DIAGNOSIS — B351 Tinea unguium: Secondary | ICD-10-CM | POA: Diagnosis not present

## 2016-06-29 DIAGNOSIS — S93409A Sprain of unspecified ligament of unspecified ankle, initial encounter: Secondary | ICD-10-CM

## 2016-06-29 NOTE — Progress Notes (Signed)
She presents today for a follow-up of her ankle pain which she states has resolved. She is also requesting that we trim all of her nails and consider removing the hallux nails.  Objective: Vital signs are stable she is alert and oriented 3. Pulses are palpable. Her toenails are thick yellow dystrophic onychomycotic to the lesser digits however nail dystrophy to the hallux nails bilaterally.  Assessment: Nail dystrophy hallux bilateral onychomycosis.  Plan: Debridement of all toenails 1 through 5 bilateral. At this point I do not see any reason to remove the nails. Follow up with her as needed.

## 2016-07-01 ENCOUNTER — Other Ambulatory Visit: Payer: Self-pay | Admitting: Internal Medicine

## 2016-07-01 ENCOUNTER — Ambulatory Visit (INDEPENDENT_AMBULATORY_CARE_PROVIDER_SITE_OTHER): Payer: PPO

## 2016-07-01 DIAGNOSIS — Z23 Encounter for immunization: Secondary | ICD-10-CM | POA: Diagnosis not present

## 2016-07-01 DIAGNOSIS — Z1211 Encounter for screening for malignant neoplasm of colon: Secondary | ICD-10-CM

## 2016-07-02 ENCOUNTER — Encounter: Payer: Self-pay | Admitting: Internal Medicine

## 2016-07-08 DIAGNOSIS — M797 Fibromyalgia: Secondary | ICD-10-CM | POA: Diagnosis not present

## 2016-07-08 DIAGNOSIS — M9903 Segmental and somatic dysfunction of lumbar region: Secondary | ICD-10-CM | POA: Diagnosis not present

## 2016-07-08 DIAGNOSIS — M545 Low back pain: Secondary | ICD-10-CM | POA: Diagnosis not present

## 2016-07-08 DIAGNOSIS — M9901 Segmental and somatic dysfunction of cervical region: Secondary | ICD-10-CM | POA: Diagnosis not present

## 2016-07-27 ENCOUNTER — Ambulatory Visit (INDEPENDENT_AMBULATORY_CARE_PROVIDER_SITE_OTHER): Payer: PPO | Admitting: Internal Medicine

## 2016-07-27 ENCOUNTER — Encounter: Payer: Self-pay | Admitting: Internal Medicine

## 2016-07-27 VITALS — BP 108/66 | HR 86 | Temp 97.6°F | Resp 14 | Ht 63.75 in | Wt 96.6 lb

## 2016-07-27 DIAGNOSIS — Z79899 Other long term (current) drug therapy: Secondary | ICD-10-CM | POA: Diagnosis not present

## 2016-07-27 DIAGNOSIS — R310 Gross hematuria: Secondary | ICD-10-CM | POA: Diagnosis not present

## 2016-07-27 DIAGNOSIS — R531 Weakness: Secondary | ICD-10-CM

## 2016-07-27 LAB — CBC WITH DIFFERENTIAL/PLATELET
BASOS PCT: 0 %
Basophils Absolute: 0 cells/uL (ref 0–200)
EOS ABS: 51 {cells}/uL (ref 15–500)
Eosinophils Relative: 1 %
HCT: 38.2 % (ref 35.0–45.0)
Hemoglobin: 12.4 g/dL (ref 11.7–15.5)
LYMPHS PCT: 11 %
Lymphs Abs: 561 cells/uL — ABNORMAL LOW (ref 850–3900)
MCH: 29.2 pg (ref 27.0–33.0)
MCHC: 32.5 g/dL (ref 32.0–36.0)
MCV: 90.1 fL (ref 80.0–100.0)
MONOS PCT: 7 %
MPV: 10.1 fL (ref 7.5–12.5)
Monocytes Absolute: 357 cells/uL (ref 200–950)
Neutro Abs: 4131 cells/uL (ref 1500–7800)
Neutrophils Relative %: 81 %
PLATELETS: 274 10*3/uL (ref 140–400)
RBC: 4.24 MIL/uL (ref 3.80–5.10)
RDW: 14.1 % (ref 11.0–15.0)
WBC: 5.1 10*3/uL (ref 3.8–10.8)

## 2016-07-27 MED ORDER — NITROFURANTOIN MONOHYD MACRO 100 MG PO CAPS: 100.0000 mg | ORAL_CAPSULE | Freq: Two times a day (BID) | ORAL | 0 refills | Status: DC

## 2016-07-27 NOTE — Progress Notes (Signed)
[place] [facility]   [name], M.D.    Uvaldo Bristle. Silverio Lay, P.A.-C      Starlyn Skeans, P.A.-C  [facility]                [address] [place], [place] [id number] Telephone [phone] Telefax [phone] Subjective:    Patient ID: Shelly Barrett, female    DOB: December 08, 1940, 75 y.o.   MRN: [id number]  HPI  This very nice 75 yo MWF presents today with c/o gross hematuria and left LBP that she rates at 10/10 and out of desperation, she took a Xanax and now rates the pain at 0-1/10. She did have her son drive and accompany her & her husband for her visit today. Patient had and episode of painless hematuria in Dec 2016 and U/C was negative and she had a CT scan which showed several kidney stones w/o signs of obstruction. She then was seen in August for hematuria and U/C was negative and a repeat episode in Sept - again with a negative culture and Urology referral was made. Patient relates she is scheduled to see Dr Matilde Sprang next week and is concerned that she may not live long enough to see him. She denies any fever, N/V, rash and describes her discomfort as slightly left-sided in the low lumbar area and denies any abdominal discomfort or dysuria. She also feels she is gradually becoming weaker over the last several months. Appetite has been fair.   Medication Sig  . ALPRAZolam (XANAX XR) 0.5 MG  Take 0.5 mg by mouth daily as needed.  . Biotin 1 MG CAPS Take by mouth.  Marland Kitchen CALCIUM PO Take 250 mg by mouth.  Marland Kitchen VITAMIN D 1000 UNITS tablet Take 6,000 Units by mouth daily.  . hyoscyamineSL 0.125 MG SL Take 1 to 2 tab 3 to 4 x day if needed  . tamsulosin  0.4 MG CAPS capsule   . aspirin 81 MG tablet Take 81 mg by mouth daily.   Allergies  Allergen Reactions  . Betadine [Povidone Iodine]   . Ciprocinonide [Fluocinolone]   . Ciprofloxacin Hcl     Hives  . Flexeril [Cyclobenzaprine]   . Ibuprofen   . Naprosyn  [Naproxen]   . Penicillins     REACTION: rash  . Shellfish Allergy   . Sodium Benzoate [Nutritional Supplements]   . Iodinated Diagnostic Agents Rash   Past Medical History:  Diagnosis Date  . Arthritis   . Hyperlipidemia   . Prediabetes   . Scoliosis   . Vitamin D deficiency    Past Surgical History:  Procedure Laterality Date  . [place], 2012   implants  . TONSILLECTOMY AND ADENOIDECTOMY    . TUBAL LIGATION     Review of Systems In addition to the HPI above,  No Headache, No changes with Vision or hearing,  No problems swallowing food or Liquids,  No Chest pain or productive Cough or Shortness of Breath,  No Abdominal pain, No Nausea or Vomitting, Bowel movements are regular,  No Blood in stool,  No dysuria,  No new skin rashes or bruises,  No new joints pains-aches,  No new weakness, tingling, numbness in any extremity,  No recent weight loss,  No polyuria, polydypsia or polyphagia,  No significant Mental Stressors.  A full 10 point Review of Systems was done, except as stated  above, all other Review of Systems were negative    Objective:   Physical Exam  BP 108/66   Pulse 86   Temp 97.6 F (36.4 C)   Resp 14   Ht 5' 3.75" (1.619 m)   Wt 96 lb 9.6 oz (43.8 kg)   SpO2 95%   BMI 16.71 kg/m   Very frail appearing older WF in no acute distress. Exposed skin is clear.   HEENT - Eac's patent. TM's Nl. EOM's full. PERRLA. NasoOroPharynx clear. Neck - supple. Nl Thyroid. Carotids 2+ & No bruits, nodes, JVD Chest - Clear equal BS w/o Rales, rhonchi, wheezes. Cor - Nl HS. RRR w/o sig MGR. PP 1(+). No edema. Abd - No palpable organomegaly, masses or tenderness. BS nl. MS- mild kyphosis.  Nl tender lower left para lumbar area. FROM w/o deformities. Muscle power, tone and bulk Nl. Gait Nl. Neuro - No obvious Cr N abnormalities. Sensory, motor and Cerebellar functions appear Nl w/o focal abnormalities. Psyche - Anxious.    Assessment & Plan:   1.  Hematuria, gross  - has consult pending next week with Dr Matilde Sprang  - CBC with Differential/Platelet - Urinalysis, Routine w reflex microscopic - Urine culture - pending U/C, start  MACROBID 100 MG cap; Take 1 cap  2  times daily.  Dispense: 14 caps  2. Weakness  - TSH  3. Medication management  - BASIC METABOLIC PANEL WITH GFR - Hepatic function panel - Magnesium

## 2016-07-27 NOTE — Patient Instructions (Signed)

## 2016-07-28 ENCOUNTER — Ambulatory Visit: Payer: Self-pay | Admitting: Internal Medicine

## 2016-07-28 LAB — URINALYSIS, ROUTINE W REFLEX MICROSCOPIC
Bilirubin Urine: NEGATIVE
Glucose, UA: NEGATIVE
Ketones, ur: NEGATIVE
NITRITE: NEGATIVE
PH: 5.5 (ref 5.0–8.0)
Specific Gravity, Urine: 1.017 (ref 1.001–1.035)

## 2016-07-28 LAB — HEPATIC FUNCTION PANEL
ALBUMIN: 4.1 g/dL (ref 3.6–5.1)
ALK PHOS: 105 U/L (ref 33–130)
ALT: 16 U/L (ref 6–29)
AST: 22 U/L (ref 10–35)
BILIRUBIN TOTAL: 0.4 mg/dL (ref 0.2–1.2)
Bilirubin, Direct: 0.1 mg/dL (ref <=0.2)
Indirect Bilirubin: 0.3 mg/dL (ref 0.2–1.2)
Total Protein: 6.7 g/dL (ref 6.1–8.1)

## 2016-07-28 LAB — URINALYSIS, MICROSCOPIC ONLY
BACTERIA UA: NONE SEEN [HPF]
Casts: NONE SEEN [LPF]
Crystals: NONE SEEN [HPF]
YEAST: NONE SEEN [HPF]

## 2016-07-28 LAB — BASIC METABOLIC PANEL WITH GFR
BUN: 18 mg/dL (ref 7–25)
CALCIUM: 9.4 mg/dL (ref 8.6–10.4)
CO2: 25 mmol/L (ref 20–31)
CREATININE: 0.66 mg/dL (ref 0.60–0.93)
Chloride: 104 mmol/L (ref 98–110)
GFR, Est Non African American: 87 mL/min (ref >=60)
Glucose, Bld: 114 mg/dL — ABNORMAL HIGH (ref 65–99)
POTASSIUM: 4.2 mmol/L (ref 3.5–5.3)
Sodium: 139 mmol/L (ref 135–146)

## 2016-07-28 LAB — MAGNESIUM: MAGNESIUM: 2.1 mg/dL (ref 1.5–2.5)

## 2016-07-28 LAB — URINE CULTURE: ORGANISM ID, BACTERIA: NO GROWTH

## 2016-07-28 LAB — TSH: TSH: 0.8 mIU/L

## 2016-08-05 DIAGNOSIS — R310 Gross hematuria: Secondary | ICD-10-CM | POA: Diagnosis not present

## 2016-08-05 DIAGNOSIS — R350 Frequency of micturition: Secondary | ICD-10-CM | POA: Diagnosis not present

## 2016-08-05 DIAGNOSIS — N3946 Mixed incontinence: Secondary | ICD-10-CM | POA: Diagnosis not present

## 2016-08-05 DIAGNOSIS — R351 Nocturia: Secondary | ICD-10-CM | POA: Diagnosis not present

## 2016-08-12 DIAGNOSIS — N200 Calculus of kidney: Secondary | ICD-10-CM | POA: Diagnosis not present

## 2016-08-12 DIAGNOSIS — R310 Gross hematuria: Secondary | ICD-10-CM | POA: Diagnosis not present

## 2016-08-16 DIAGNOSIS — R351 Nocturia: Secondary | ICD-10-CM | POA: Diagnosis not present

## 2016-08-16 DIAGNOSIS — R310 Gross hematuria: Secondary | ICD-10-CM | POA: Diagnosis not present

## 2016-08-18 ENCOUNTER — Ambulatory Visit (INDEPENDENT_AMBULATORY_CARE_PROVIDER_SITE_OTHER): Payer: PPO | Admitting: Internal Medicine

## 2016-08-18 VITALS — BP 120/66 | HR 84 | Temp 97.7°F | Resp 16 | Ht 63.75 in | Wt 90.0 lb

## 2016-08-18 DIAGNOSIS — R109 Unspecified abdominal pain: Secondary | ICD-10-CM

## 2016-08-18 DIAGNOSIS — R310 Gross hematuria: Secondary | ICD-10-CM | POA: Diagnosis not present

## 2016-08-18 DIAGNOSIS — R197 Diarrhea, unspecified: Secondary | ICD-10-CM | POA: Diagnosis not present

## 2016-08-18 NOTE — Patient Instructions (Signed)
Gluten-Free Diet for Celiac Disease Gluten is a protein found in wheat, rye, barley, and triticale (a cross between wheat and rye) grains. People with celiac disease need to have a gluten-free diet. With celiac disease, gluten interferes with the absorption of food and may also cause intestinal injury.  Strict compliance is important even during symptom-free periods. This means eliminating all foods with gluten from your diet permanently. This requires some significant changes but is very manageable. WHAT DO I NEED TO KNOW ABOUT A GLUTEN-FREE DIET?  Look for items labeled with "GF." Looking for GF will make it easier to identify products that are safe to eat.  Read all labels. Gluten may have been added as a minor ingredient where least expected, such as in shredded cheeses or ice creams. Always check food labels and investigate questionable ingredients. Talk to your dietitian or health care provider if you have questions about certain foods or need help finding GF foods.  Check when in doubt. If you are not sure whether an ingredient contains gluten, check with the manufacturer. Note that some manufacturers may change ingredients without notice. Always read labels.   Know how food is prepared. Since flour and cereal products are often used in the preparation of foods, it is important to be aware of the methods of preparation used, as well as the ingredients in the foods themselves. This is especially true when you are dining out. Ask restaurants if they have a gluten-free menu.  Watch for cross-contamination. Cross-contamination occurs when gluten-free foods come into contact with foods that contain gluten. It often happens during the manufacturing process. Always check the ingredient list and for warnings on packages, such as "may contain gluten."  Eat a balanced diet. It is important to still get enough fiber, iron, and B vitamins in your diet. Look for enriched whole grain gluten-free products  and continue to eat a well-balanced diet of the important non-grain items, such as vegetables, fruit, lean proteins, legumes, and dairy.  Consider taking a gluten-free multivitamin and mineral supplement. Discuss this with your health care provider. WHAT KEY WORDS HELP IDENTIFY GLUTEN? Know key words to help identify gluten. A dietitian can help you identify possible harmful ingredients in the foods you normally eat. Words to check for on food labels include:   Flour, enriched flour, bromated flour, white flour, durum flour, graham flour, phosphated flour, self-rising flour, semolina, or farina.  Starch, dextrin, modified food starch, or cereal.  Thickening, fillers, or emulsifiers.  Any kind of malt flavoring, extract, or syrup (malt is made from barley and includes malt vinegar, malted milk, and malted beverages).  Hydrolyzed vegetable protein. WHAT FOODS CAN I EAT? Below is a list of common foods that are allowed with a gluten-free diet.  Grains Products made from the following flours or grains:amaranth,bean flours, 100% buckwheat flour, corn, millet, nut flours or meals, GF oats, quinoa, rice, sorghum, teff, any all-purpose 100% GF flour mix, rice wafers, pure cornmeal tortillas, popcorn, some crackers, some chips, and hot cereals made from cornmeal. Ask your dietitian which specific hot and cold cereals are allowed. Hominy, rice or wild rice, and special GF pasta. Some Asian rice noodles or bean noodles. Arrowroot starch, corn bran, corn flour, corn germ, cornmeal, corn starch, potato flour, potato starch flour, and rice bran. Rice flours: plain, brown, and sweet. Rice polish, soy flour, tapioca starch. Vegetables All plain, fresh, frozen, or canned vegetables.  Fruits All fresh, frozen, canned, dried fruits, and fruit juices.  Meats and Other   Protein Foods Meat, fish, poultry, or eggs prepared without added wheat, rye, barley, or triticale. Some luncheon meat and some frankfurters.  Pure meat. All aged cheese, most processed cheese products, some cottage cheese, and some cream cheese. Dried beans, dried peas, and lentils.  Dairy Milk and yogurt made with allowed ingredients.  Beverages Coffee (regular or decaffeinated), tea, herbal tea (read label to be sure that no wheat flour has been added). Carbonated beverages and some root beers. Wine, sake, and distilled spirits, such as gin, vodka, and whiskey. GF beers and GF ciders.  Sweetsand Desserts Sugar, honey, some syrups, molasses, jelly, jam, plain hard candy, marshmallows, gumdrops, homemade candies free of wheat, rye, barley, or triticale. Coconut. Custard, some pudding mixes, and homemade puddings from cornstarch, rice, and tapioca. Gelatin desserts, sorbets, frozen ice pops, and sherbet. Cake, cookies, and other desserts prepared with allowed flours. Some commercial ice creams. Ask your dietitian about specific brands of dessert that are allowed.  Fats and Oils Butter, margarine, vegetable oil, sour cream not containing modified food starch, whipping cream, shortening, lard, cream, and some mayonnaise. Some commercial salad dressings. Peanut butter.  Other Homemade broth and soups made with allowed ingredients; some canned or frozen soups. Any other combination or prepared foods that do not contain gluten. Monosodium glutamate (MSG). Cider, rice, and wine vinegar. Baking soda and baking powder. Certain soy sauces (Tamari). Ask your dietitian about specific brands that are allowed. Nuts, coconut, chocolate, and pure cocoa powder. Salt, pepper, herbs, spices, extracts, and food colorings. The items listed above may not be a complete list of allowed foods or beverages. Contact your dietitian for more options.  WHAT FOODS CAN I NOT EAT? Below is a list of common foods that are not allowed with a gluten-free diet.  Grains Barley, bran, bulgur, cracked wheat, graham, malt, matzo, wheat germ, and all wheat and rye cereals  including spelt and kamut. Avoid cereals containing malt as a flavoring, such as rice cereal. Also avoid regular noodles, spaghetti, macaroni, and most packaged rice mixes, and all others containing wheat, rye, barley, or triticale.  Vegetables Most creamed vegetables, most vegetables canned in sauces, and any vegetables prepared with wheat, rye, barley, or triticale.  Fruits Thickened or prepared fruits and some pie fillings.  Meats and Other Protein Sources Any meat or meat alternative containing wheat, rye, barley, or gluten stabilizers (such as some hot dogs, salami, cold cuts, or sausage). Bread-containing products, such as Swiss steak, croquettes, and meatloaf. Most tuna canned in vegetable broth, [place] with hydrolyzed vegetable protein (HVP) injected as part of the basting, and any cheese product containing oat gum as an ingredient. Seitan. Imitation fish. Dairy Commercial chocolate milk, which may have cereal added, and malted milk. Beverages Certain cereal beverages. Beer and ciders (unless GF), ale, malted milk, and some root beers. Sweetsand Desserts Commercial candies containing wheat, rye, barley, or triticale. Certain toffees are dusted with wheat flour. Chocolate-coated nuts, which are often rolled in flour. Cakes, cookies, doughnuts, and pastries that are prepared with wheat, barley, rye, or triticale flour. Some commercial ice creams, ice cream flavors which contain cookies, crumbs, or cheesecake. Ice cream cones. Commercially prepared mixes for cakes, cookies, and other desserts unless marked GF. Bread pudding and other puddings thickened with flour. Fats and Oils Some commercial salad dressings and sour cream containing modified food starch.  Condiments Some curry powder, some dry seasoning mixes, some gravy extracts, some meat sauces, some ketchup, some prepared mustard, horseradish. Other All soups containing wheat,  rye, barley, or triticale flour. Bouillon and bouillon  cubes that contain HVP. Combination or prepared foods that contain gluten. Some soy sauce, some chip dips, and some chewing gum. Yeast extract (contains barley). Caramel color (may contain malt). The items listed above may not be a complete list of foods and beverages to avoid. Contact your dietitian for more information. +++++++++++++++++++++++++++++++++++  Celiac Disease Celiac disease is an allergy to the protein that is called gluten. When a person with celiac disease eats a food that has gluten in it, his or her natural defense system (immune system) attacks the cells that line the small intestine. Over time, this reaction damages the small intestine and makes the small intestine unable to absorb nutrients from food. Gluten is found in wheat, rye, and barley and in foods like pasta, pizza, and cereal. Celiac disease is also known as celiac sprue, nontropical sprue, and gluten-sensitive enteropathy. CAUSES This condition is caused by a gene that is passed down through families (inherited). RISK FACTORS This condition is more likely to develop in people who have a family member with the disease. SYMPTOMS Symptoms of this condition include:  Recurring bloating and pain in the abdomen.  Gas.  Long-term (chronic) diarrhea.  Pale, bad-smelling, greasy, or oily stool.  Weight loss.  Missed menstrual periods.  Weakening bones (osteoporosis).  Fatigue and weakness.  Tingling or other signs of nerve damage.  Depression.  Poor appetite.  Rash. In some cases, there are no symptoms. DIAGNOSIS This condition is diagnosed with a physical exam and tests. Tests may include:  Blood tests to check for nutritional deficiencies.  Blood tests to look for evidence that the body is attacking cells in the small intestine.  A test in which a sample of tissue is taken from the small bowel and examined under a microscope (biopsy).  X-rays of the bowel.  Stool tests.  Tests to check for  nutrient absorption from the intestine. TREATMENT There is no cure for this condition, but it can be managed with a gluten-free diet. Treatment may also involve avoiding dairy foods, such as milk and cheese, because they are hard to digest. Most people who follow a gluten-free diet feel better and stop having symptoms. The intestine usually heals within 3 months to 2 years. In a small percentage of people, this condition does not improve on the gluten-free diet. If your condition does not improve, more tests will be done. You will also need to work with a specialist in celiac disease to find the best treatment for you. HOME CARE INSTRUCTIONS  Follow instructions from your health care provider about diet.  Monitor your body's response to the gluten-free diet. Write down any changes in your symptoms and changes in how you feel.  If you decide to eat outside of the home, prepare your meal ahead of time, or make sure that the place where you are going has gluten-free options.  Keep all follow-up visits as told by your health care provider. This is important.  Suggest to family members that they get screened for early signs of the disease. SEEK MEDICAL CARE IF:  You continue to have symptoms, even when you are eating a gluten-free diet.  You have trouble sticking to the gluten-free diet.  You develop an itchy rash with groups of tiny blisters.  You develop severe weakness.  You develop balance problems.  You develop new symptoms.   This information is not intended to replace advice given to you by your health care provider.  Make sure you discuss any questions you have with your health care provider.   Document Released: 09/27/2005 Document Revised: 06/18/2015 Document Reviewed: 01/20/2015 Elsevier Interactive Patient Education [employer].

## 2016-08-19 LAB — CELIAC DISEASE COMPREHENSIVE PANEL WITH REFLEXES
IGA: 313 mg/dL (ref 81–463)
TISSUE TRANSGLUTAMINASE AB, IGA: 1 U/mL (ref <4)

## 2016-08-21 ENCOUNTER — Encounter: Payer: Self-pay | Admitting: Internal Medicine

## 2016-08-21 NOTE — Progress Notes (Signed)
[place] [facility]   [name], M.D.    Uvaldo Bristle. Silverio Lay, P.A.-C      Starlyn Skeans, P.A.-C  [facility]                [address] [place], [place] [id number] Telephone [phone] Telefax [phone] Subjective:    Patient ID: Shelly Barrett, female    DOB: [birth year], 76 y.o.   MRN: [id number]  HPI  This very nice 75 yo MWF with HTN, HLD, PreDM was recently evaluated by Dr Matilde Sprang for Gross Hematuria by Cystoscopy and also had a CT urogram and fortunately no cancer was identified. She presents today c/o diarrhea and abdominal cramping, pc bloating and diarrhea and is convinced and self diagnosed with Celiac Disease. She has lost 6 # pounds since her last OV due to eliminating all grains from her diet. Denies any fever, sweats, chills or rash.   Patient indicates that she has stopped all of her meds as below   Medication Sig  . ALPRAZolam-XR 0.5 MG  Take 0.5 mg by mouth daily as needed.  Marland Kitchen aspirin 81 MG Take 81 mg by mouth daily.  . Biotin 1 MG  Take by mouth.  Marland Kitchen CALCIUM Take 250 mg by mouth.  Marland Kitchen VITAMIN D 1000 UNITS  Take 6,000 Units by mouth daily.  Marland Kitchen LEVSIN SL 0.125 MG SL Take 1 to 2 tablets 3 to 4 x day if needed for Nausea, vomiting, cramping or diarrhea (Patient not taking: Reported on 08/18/2016)  . tamsulosin 0.4 MG     Allergies  Allergen Reactions  . Betadine [Povidone Iodine]   . Ciprocinonide [Fluocinolone]   . Ciprofloxacin Hcl     Hives  . Flexeril [Cyclobenzaprine]   . Ibuprofen   . Naprosyn [Naproxen]   . Penicillins     REACTION: rash  . Shellfish Allergy   . Sodium Benzoate [Nutritional Supplements]   . Iodinated Diagnostic Agents Rash   Review of Systems   10 point systems review negative except as above.    Objective:   Physical Exam  BP 120/66   Pulse 84   Temp 97.7 F (36.5 C)   Resp 16   Ht 5' 3.75" (1.619 m)   Wt 90 lb (40.8 kg)   BMI 15.57 kg/m    Older frail appearing white female.  HEENT - Eac's patent. TM's Nl. EOM's full. PERRLA. NasoOroPharynx clear. Neck - supple. Nl Thyroid. Carotids 2+ & No bruits, nodes, JVD Chest - mild kyphosis. Clear equal BS w/o Rales, rhonchi, wheezes. Cor - Nl HS. RRR w/o sig MGR. PP 1(+). No edema. Abd - Soft, flat. No palpable organomegaly, masses or tenderness. BS nl. MS- FROM w/o deformities. Muscle power, tone and bulk is very decreased. Gait Nl. Neuro - No obvious Cr N abnormalities. Sensory, motor and Cerebellar functions appear Nl w/o focal abnormalities.    Assessment & Plan:   1. Hematuria, gross  - neg w/u  2. Abdominal pain, unspecified  - Celiac Disease Comprehensive Panel with Reflexes to r/o Celiac Disease  3. Diarrhea, unspecified  - Celiac Disease Comprehensive Panel with Reflexes  4. Weight Loss   - due to Phobic Food Aversion

## 2016-08-31 ENCOUNTER — Ambulatory Visit (INDEPENDENT_AMBULATORY_CARE_PROVIDER_SITE_OTHER): Payer: PPO | Admitting: Podiatry

## 2016-08-31 ENCOUNTER — Encounter: Payer: Self-pay | Admitting: Podiatry

## 2016-08-31 DIAGNOSIS — B351 Tinea unguium: Secondary | ICD-10-CM

## 2016-08-31 DIAGNOSIS — M79676 Pain in unspecified toe(s): Secondary | ICD-10-CM | POA: Diagnosis not present

## 2016-08-31 NOTE — Progress Notes (Signed)
She presents today chief complaint of painful elongated toenails.  Objective: Pulses are palpable. Toenails are thick yellow dystrophic onychomycotic.  Assessment: Healing second and onychomycosis.  Plan: Debridement toenails bilateral.

## 2016-09-07 ENCOUNTER — Telehealth: Payer: Self-pay | Admitting: *Deleted

## 2016-09-07 DIAGNOSIS — H2513 Age-related nuclear cataract, bilateral: Secondary | ICD-10-CM | POA: Diagnosis not present

## 2016-09-07 NOTE — Telephone Encounter (Signed)
Pt aware of negative celiac lab results

## 2016-09-14 ENCOUNTER — Encounter: Payer: Self-pay | Admitting: Internal Medicine

## 2016-09-14 ENCOUNTER — Ambulatory Visit (INDEPENDENT_AMBULATORY_CARE_PROVIDER_SITE_OTHER): Payer: PPO | Admitting: Internal Medicine

## 2016-09-14 VITALS — BP 110/58 | HR 92 | Ht 63.75 in | Wt 91.0 lb

## 2016-09-14 DIAGNOSIS — R1012 Left upper quadrant pain: Secondary | ICD-10-CM | POA: Diagnosis not present

## 2016-09-14 DIAGNOSIS — R634 Abnormal weight loss: Secondary | ICD-10-CM

## 2016-09-14 DIAGNOSIS — R197 Diarrhea, unspecified: Secondary | ICD-10-CM | POA: Diagnosis not present

## 2016-09-14 NOTE — Progress Notes (Signed)
Shelly Barrett 75 y.o. [birth year] [id number]  Assessment & Plan:   Encounter Diagnoses  Name Primary?  . Loss of weight Yes  . LUQ pain   . Diarrhea, unspecified type    Evaluate w/ EGD and colonoscopy The risks and benefits as well as alternatives of endoscopic procedure(s) have been discussed and reviewed. All questions answered. The patient agrees to proceed.  Suspect functional GI issues likely - weight loss mild - she is underweight though  I appreciate the opportunity to care for this patient. [id number] DAVID, MD     Subjective:   Chief Complaint: weight loss   HPI 75 yo ww w/ hx of diarrhea at times and poor appetite and weight loss. Hx a bit vague but she has been losing weight w/o intent. Saw Dr. Melford Barrett - neg celiac testing but she has been avoiding gluten and thinks that helps (since the test.). Also c/ gas rumbling - borborygmi - and some LUQ discomfort. Sounds like she has had at least some GI sxs x years. GI ROS o/w negative  Wt Readings from Last 3 Encounters:  09/14/16 91 lb (41.3 kg)  08/18/16 90 lb (40.8 kg)  07/27/16 96 lb 9.6 oz (43.8 kg)   Says she was heavier - I see 105# in 2015.  Allergies  Allergen Reactions  . Betadine [Povidone Iodine]   . Ciprocinonide [Fluocinolone]   . Ciprofloxacin Hcl     Hives  . Flexeril [Cyclobenzaprine]   . Ibuprofen   . Naprosyn [Naproxen]   . Penicillins     REACTION: rash  . Shellfish Allergy   . Sodium Benzoate [Nutritional Supplements]   . Iodinated Diagnostic Agents Rash   Outpatient Medications Prior to Visit  Medication Sig Dispense Refill  . ALPRAZolam (XANAX XR) 0.5 MG 24 hr tablet Take 0.5 mg by mouth daily as needed.    Marland Kitchen aspirin 81 MG tablet Take 81 mg by mouth daily.    . Biotin 1 MG CAPS Take by mouth.    Marland Kitchen CALCIUM PO Take 250 mg by mouth.    . cholecalciferol (VITAMIN D) 1000 UNITS tablet Take 6,000 Units by mouth daily.    . hyoscyamine (LEVSIN SL) 0.125 MG SL tablet Take 1 to  2 tablets 3 to 4 x day if needed for Nausea, vomiting, cramping or diarrhea (Patient not taking: Reported on 09/14/2016) 30 tablet 0  . tamsulosin (FLOMAX) 0.4 MG CAPS capsule      No facility-administered medications prior to visit.    Past Medical History:  Diagnosis Date  . Arthritis   . Hyperlipidemia   . Prediabetes   . Scoliosis   . Sigmoid diverticulitis   . Vitamin D deficiency    Past Surgical History:  Procedure Laterality Date  . [place], 2012   implants  . COLONOSCOPY    . TONSILLECTOMY AND ADENOIDECTOMY    . TUBAL LIGATION     Social History   Social History  . Marital status: Married    Spouse name: N/A  . Number of children: 1  . Years of education: N/A   Occupational History  . retired    Social History Main Topics  . Smoking status: Never Smoker  . Smokeless tobacco: Never Used  . Alcohol use No  . Drug use: No  . Sexual activity: Not Asked   Other Topics Concern  . None   Social History Narrative  . None   Family History  Problem Relation Age of Onset  .  Heart disease Mother   . Hypertension Mother   . Heart disease Father   . Cancer Father     throat and prostate  . Arthritis Sister     RA  . Hyperlipidemia Brother   . Colon cancer Neg Hx   . Stomach cancer Neg Hx   . Rectal cancer Neg Hx   . Esophageal cancer Neg Hx   . Alcohol abuse Neg Hx   . Liver cancer Neg Hx         Review of Systems + insomnia, fatigue, increased thirst and urination, urinary frequency and leakage Decreased hearing All other negative   Objective:   Physical Exam @BP  (!) 110/58   Pulse 92   Ht 5' 3.75" (1.619 m)   Wt 91 lb (41.3 kg)   BMI 15.74 kg/m @  General:  Thin, underweight and in no acute distress Eyes:  anicteric. Neck:   supple w/o thyromegaly or mass.  Lungs: Clear to auscultation bilaterally. Heart:  S1S2, no rubs, murmurs, gallops. Abdomen:  soft, non-tender, no hepatosplenomegaly, hernia, or mass and BS+.    Rectal: deferred Extremities:   no edema, cyanosis or clubbing Skin   no rash. Neuro:  A&O x 3.  Psych:  appropriate mood and  Affect.   Data Reviewed: PCP notes, labs in EMR Recurrent microhematuria - negative w/u she says   CT abd/pelvis no contrast 09/2015 negative

## 2016-09-14 NOTE — Patient Instructions (Addendum)
  You have been scheduled for an endoscopy and colonoscopy. Please follow the written instructions given to you at your visit today. Please pick up your prep supplies at the pharmacy. If you use inhalers (even only as needed), please bring them with you on the day of your procedure. Your physician has requested that you go to www.[email or web address] and enter the access code given to you at your visit today. This web site gives a general overview about your procedure. However, you should still follow specific instructions given to you by our office regarding your preparation for the procedure.   You may re-try your supplements one at a time and see if your symptoms return.    I appreciate the opportunity to care for you. Silvano Rusk, MD, [facility]

## 2016-10-05 NOTE — Patient Instructions (Signed)

## 2016-10-05 NOTE — Progress Notes (Signed)
[place] [facility] [name], M.D.        Uvaldo Bristle. Silverio Lay, P.A.-C       Starlyn Skeans, P.A.-C  [facility]                [address] [place], [place] [id number] Telephone [phone] Telefax [phone] ______________________________________________________________________     This very nice  75 yo MWF presents for 3 month follow up with Labile Hypertension, Hyperlipidemia, Pre-Diabetes and Vitamin D Deficiency. Patient has hx/o weight loss of 13# (BMI 15.74) over the last year Weight 102# in Nov 2016)  and also c/o diarrhea and had a recent negative celiac panel. Patient reports that she continues to avoid Gluten as well as  dairy/lactose in her diet.  She saw Dr Carlean Purl recently who has scheduled her for EGD/Colonoscopy to evaluate weight loss & diarrhea.   Wt Readings from Last 3 Encounters:  10/06/16 89 lb 6.4 oz (40.6 kg)  09/14/16 91 lb (41.3 kg)  08/18/16 90 lb (40.8 kg)      Patient was recently evaluated by Dr Matilde Sprang for Gross Hematuria by Cystoscopy and also had a CT urogram and fortunately no cancer was identified.  Also, she has DJD and DDD and moderately severe thoracolumbar scoliosis. In 2002 she had EDSI for L4L5 HNP and also routinely sees a chiropractor for adjustments.     Patient has hx/o labile HTN predating since 2002 and has been monitored expectantly.  BP has been controlled at home. Today's BP is at goal -100/70. Patient has had no complaints of any cardiac type chest pain, palpitations, dyspnea/orthopnea/PND, dizziness, claudication, or dependent edema.     Patient's Hyperlipidemia is controlled with diet.  Last Lipids were at goal: Lab Results  Component Value Date   CHOL 185 06/23/2016   HDL 91 06/23/2016   LDLCALC 80 06/23/2016   TRIG 69 06/23/2016   CHOLHDL 2.0 06/23/2016      Also, the patient has history of PreDiabetes & Insulin Resistance with A1c 6.1% in 2012  and 5.9% in 2014 and has had no symptoms of reactive hypoglycemia, diabetic polys, paresthesias or visual blurring.  Last A1c was normal and at goal: Lab Results  Component Value Date   HGBA1C 5.4 06/23/2016      Further, the patient also has history of Vitamin D Deficiency in 2008 of "24" and supplements vitamin D without any suspected side-effects. Last vitamin D was 62 - at goal, but today she relates that she has stopped ALL of her meds and supplements.   Current Outpatient Prescriptions on File Prior to Visit                    Patient relates that she has stopped all of her meds & supplements   Medication Sig  . ALPRAZolam XR 0.5 MG  Take 0.5 mg  daily as needed.  Marland Kitchen aspirin 81 MG  Take 81 mg  daily.  . Biotin 1 MG  Take daily  . CALCIUM 250 mg Take   daily  . VITAMIN D 1000 UNITS  Take 6,000 Units  daily.   Allergies  Allergen Reactions  . Betadine [Povidone Iodine]   . Ciprocinonide [Fluocinolone]   . Ciprofloxacin Hcl     Hives  . Flexeril [Cyclobenzaprine]   . Ibuprofen   . Naprosyn [Naproxen]   . Penicillins  REACTION: rash  . Shellfish Allergy   . Sodium Benzoate [Nutritional Supplements]   . Iodinated Diagnostic Agents Rash   PMHx:   Past Medical History:  Diagnosis Date  . Arthritis   . Hyperlipidemia   . Prediabetes   . Scoliosis   . Sigmoid diverticulitis   . Vitamin D deficiency    Immunization History  Administered Date(s) Administered  . Influenza, High Dose Seasonal PF 07/30/2014, 06/25/2015, 07/01/2016  . Influenza-Unspecified 06/20/2013  . Pneumococcal Conjugate-13 09/18/2014  . Pneumococcal-Unspecified 09/16/2009  . Tdap 11/17/2010  . Zoster 09/16/2009   Past Surgical History:  Procedure Laterality Date  . [place], 2012   implants  . COLONOSCOPY    . TONSILLECTOMY AND ADENOIDECTOMY    . TUBAL LIGATION     FHx:    Reviewed / unchanged  SHx:    Reviewed / unchanged  Systems Review:  Constitutional: Denies fever,  chills, wt changes, headaches, insomnia, fatigue, night sweats, change in appetite. Eyes: Denies redness, blurred vision, diplopia, discharge, itchy, watery eyes.  ENT: Denies discharge, congestion, post nasal drip, epistaxis, sore throat, earache, hearing loss, dental pain, tinnitus, vertigo, sinus pain, snoring.  CV: Denies chest pain, palpitations, irregular heartbeat, syncope, dyspnea, diaphoresis, orthopnea, PND, claudication or edema. Respiratory: denies cough, dyspnea, DOE, pleurisy, hoarseness, laryngitis, wheezing.  Gastrointestinal: Denies dysphagia, odynophagia, heartburn, reflux, water brash, abdominal pain or cramps, nausea, vomiting, bloating, diarrhea, constipation, hematemesis, melena, hematochezia  or hemorrhoids. Genitourinary: Denies dysuria, frequency, urgency, nocturia, hesitancy, discharge, hematuria or flank pain. Musculoskeletal: Denies arthralgias, myalgias, stiffness, jt. swelling, pain, limping or strain/sprain.  Skin: Denies pruritus, rash, hives, warts, acne, eczema or change in skin lesion(s). Neuro: No weakness, tremor, incoordination, spasms, paresthesia or pain. Psychiatric: Denies confusion, memory loss or sensory loss. Endo: Denies change in weight, skin or hair change.  Heme/Lymph: No excessive bleeding, bruising or enlarged lymph nodes.  Physical Exam  BP 100/70   Pulse 64   Temp 97.5 F (36.4 C)   Resp 16   Ht 5' 3.75" (1.619 m)   Wt 89 lb 6.4 oz (40.6 kg)   BMI 15.47 kg/m   Appears very thin, frail and undernourished and in no distress.  Eyes: PERRLA, EOMs, conjunctiva no swelling or erythema. Sinuses: No frontal/maxillary tenderness ENT/Mouth: EAC's clear, TM's nl w/o erythema, bulging. Nares clear w/o erythema, swelling, exudates. Oropharynx clear without erythema or exudates. Oral hygiene is good. Tongue normal, non obstructing. Hearing intact.  Neck: Supple. Thyroid nl. Car 2+/2+ without bruits, nodes or JVD. Chest:  S -   scoliosis.Respirations nl with BS clear & equal w/o rales, rhonchi, wheezing or stridor.  Cor: Heart sounds normal w/ regular rate and rhythm without sig. murmurs, gallops, clicks, or rubs. Peripheral pulses normal and equal  without edema.  Abdomen: Soft & bowel sounds normal. Non-tender w/o guarding, rebound, hernias, masses, or organomegaly.  Lymphatics: Unremarkable.  Musculoskeletal: Full ROM all peripheral extremities, joint stability, 5/5 strength, and normal gait.  Skin: Warm, dry without exposed rashes, lesions or ecchymosis apparent.  Neuro: Cranial nerves intact, reflexes equal bilaterally. Sensory-motor testing grossly intact. Tendon reflexes grossly intact.  Pysch: Alert & oriented x 3.  Insight and judgement nl & appropriate. No ideations.  Assessment and Plan:   1. Essential hypertension  - Continue medication, monitor blood pressure at home.  - Continue DASH diet. Reminder to go to the ER if any CP,  SOB, nausea, dizziness, severe HA, changes vision/speech,  left arm numbness and tingling and jaw pain. -  CBC with Differential/Platelet - BASIC METABOLIC PANEL WITH GFR - TSH  2. Mixed hyperlipidemia  - Continue diet/meds, exercise,& lifestyle modifications.  - Continue monitor periodic cholesterol/liver & renal functions   - Hepatic function panel - TSH - Lipid panel  3. Prediabetes  - Continue diet, exercise, lifestyle modifications.  - Monitor appropriate labs. - Hemoglobin A1c  4. Vitamin D deficiency  - Continue supplementation. - VITAMIN D 25 Hydroxy   5. Loss of weight  - TSH  6. Medication management  - CBC with Differential/Platelet - BASIC METABOLIC PANEL WITH GFR - Hepatic function panel - Magnesium  7. Malabsorption, r/o  - Carotene, Serum - Vitamin B12 - Iron and TIBC - Methylmalonic acid, serum - Sedimentation rate - C-reactive protein       Recommended regular exercise, BP monitoring, weight control, and discussed med and  SE's. Recommended labs to assess and monitor clinical status. Further disposition pending results of labs. Over 30 minutes of exam, counseling, chart review was performed

## 2016-10-06 ENCOUNTER — Ambulatory Visit (INDEPENDENT_AMBULATORY_CARE_PROVIDER_SITE_OTHER): Payer: PPO | Admitting: Internal Medicine

## 2016-10-06 ENCOUNTER — Encounter: Payer: Self-pay | Admitting: Internal Medicine

## 2016-10-06 VITALS — BP 100/70 | HR 64 | Temp 97.5°F | Resp 16 | Ht 63.75 in | Wt 89.4 lb

## 2016-10-06 DIAGNOSIS — K9049 Malabsorption due to intolerance, not elsewhere classified: Secondary | ICD-10-CM | POA: Diagnosis not present

## 2016-10-06 DIAGNOSIS — E782 Mixed hyperlipidemia: Secondary | ICD-10-CM | POA: Diagnosis not present

## 2016-10-06 DIAGNOSIS — R7303 Prediabetes: Secondary | ICD-10-CM

## 2016-10-06 DIAGNOSIS — E559 Vitamin D deficiency, unspecified: Secondary | ICD-10-CM

## 2016-10-06 DIAGNOSIS — Z79899 Other long term (current) drug therapy: Secondary | ICD-10-CM

## 2016-10-06 DIAGNOSIS — R634 Abnormal weight loss: Secondary | ICD-10-CM | POA: Diagnosis not present

## 2016-10-06 DIAGNOSIS — I10 Essential (primary) hypertension: Secondary | ICD-10-CM | POA: Diagnosis not present

## 2016-10-06 LAB — CBC WITH DIFFERENTIAL/PLATELET
BASOS PCT: 0 %
Basophils Absolute: 0 cells/uL (ref 0–200)
EOS PCT: 4 %
Eosinophils Absolute: 104 cells/uL (ref 15–500)
HEMATOCRIT: 41 % (ref 35.0–45.0)
HEMOGLOBIN: 13.2 g/dL (ref 11.7–15.5)
LYMPHS ABS: 910 {cells}/uL (ref 850–3900)
Lymphocytes Relative: 35 %
MCH: 30 pg (ref 27.0–33.0)
MCHC: 32.2 g/dL (ref 32.0–36.0)
MCV: 93.2 fL (ref 80.0–100.0)
MONO ABS: 234 {cells}/uL (ref 200–950)
MPV: 10.2 fL (ref 7.5–12.5)
Monocytes Relative: 9 %
NEUTROS ABS: 1352 {cells}/uL — AB (ref 1500–7800)
NEUTROS PCT: 52 %
Platelets: 259 10*3/uL (ref 140–400)
RBC: 4.4 MIL/uL (ref 3.80–5.10)
RDW: 14.1 % (ref 11.0–15.0)
WBC: 2.6 10*3/uL — AB (ref 3.8–10.8)

## 2016-10-06 LAB — VITAMIN B12: VITAMIN B 12: 388 pg/mL (ref 200–1100)

## 2016-10-06 LAB — TSH: TSH: 1.04 m[IU]/L

## 2016-10-06 LAB — HEMOGLOBIN A1C
Hgb A1c MFr Bld: 5.3 % (ref <5.7)
Mean Plasma Glucose: 105 mg/dL

## 2016-10-07 LAB — IRON AND TIBC
%SAT: 18 % (ref 11–50)
Iron: 80 ug/dL (ref 45–160)
TIBC: 434 ug/dL (ref 250–450)
UIBC: 354 ug/dL (ref 125–400)

## 2016-10-07 LAB — HEPATIC FUNCTION PANEL
ALK PHOS: 116 U/L (ref 33–130)
ALT: 16 U/L (ref 6–29)
AST: 27 U/L (ref 10–35)
Albumin: 4.7 g/dL (ref 3.6–5.1)
BILIRUBIN DIRECT: 0.1 mg/dL (ref <=0.2)
BILIRUBIN INDIRECT: 0.3 mg/dL (ref 0.2–1.2)
Total Bilirubin: 0.4 mg/dL (ref 0.2–1.2)
Total Protein: 7.1 g/dL (ref 6.1–8.1)

## 2016-10-07 LAB — LIPID PANEL
CHOL/HDL RATIO: 1.8 ratio (ref <5.0)
Cholesterol: 195 mg/dL (ref <200)
HDL: 106 mg/dL (ref >50)
LDL CALC: 78 mg/dL (ref <100)
Triglycerides: 53 mg/dL (ref <150)
VLDL: 11 mg/dL (ref <30)

## 2016-10-07 LAB — SEDIMENTATION RATE: Sed Rate: 4 mm/hr (ref 0–30)

## 2016-10-07 LAB — BASIC METABOLIC PANEL WITH GFR
BUN: 20 mg/dL (ref 7–25)
CALCIUM: 9.6 mg/dL (ref 8.6–10.4)
CO2: 21 mmol/L (ref 20–31)
Chloride: 104 mmol/L (ref 98–110)
Creat: 0.65 mg/dL (ref 0.60–0.93)
GFR, EST NON AFRICAN AMERICAN: 88 mL/min (ref >=60)
GFR, Est African American: >89 mL/min (ref >=60)
Glucose, Bld: 91 mg/dL (ref 65–99)
POTASSIUM: 3.7 mmol/L (ref 3.5–5.3)
SODIUM: 143 mmol/L (ref 135–146)

## 2016-10-07 LAB — MAGNESIUM: MAGNESIUM: 2.3 mg/dL (ref 1.5–2.5)

## 2016-10-07 LAB — C-REACTIVE PROTEIN

## 2016-10-07 LAB — METHYLMALONIC ACID, SERUM: Methylmalonic Acid, Quant: 292 nmol/L (ref 87–318)

## 2016-10-07 LAB — VITAMIN D 25 HYDROXY (VIT D DEFICIENCY, FRACTURES): VIT D 25 HYDROXY: 48 ng/mL (ref 30–100)

## 2016-10-15 LAB — CAROTENE, SERUM: CAROTENE, TOTAL-SERUM: 42 ug/dL (ref 6–77)

## 2016-10-21 ENCOUNTER — Encounter: Payer: Self-pay | Admitting: Internal Medicine

## 2016-11-03 ENCOUNTER — Encounter: Payer: Self-pay | Admitting: Internal Medicine

## 2016-11-03 ENCOUNTER — Ambulatory Visit (AMBULATORY_SURGERY_CENTER): Payer: PPO | Admitting: Internal Medicine

## 2016-11-03 VITALS — BP 123/44 | HR 77 | Temp 98.0°F | Resp 22 | Ht 63.0 in | Wt 91.0 lb

## 2016-11-03 DIAGNOSIS — R634 Abnormal weight loss: Secondary | ICD-10-CM

## 2016-11-03 DIAGNOSIS — R1012 Left upper quadrant pain: Secondary | ICD-10-CM | POA: Diagnosis not present

## 2016-11-03 DIAGNOSIS — K449 Diaphragmatic hernia without obstruction or gangrene: Secondary | ICD-10-CM | POA: Diagnosis not present

## 2016-11-03 DIAGNOSIS — D120 Benign neoplasm of cecum: Secondary | ICD-10-CM

## 2016-11-03 DIAGNOSIS — R197 Diarrhea, unspecified: Secondary | ICD-10-CM

## 2016-11-03 DIAGNOSIS — R7303 Prediabetes: Secondary | ICD-10-CM | POA: Diagnosis not present

## 2016-11-03 MED ORDER — SODIUM CHLORIDE 0.9 % IV SOLN: 500.0000 mL | INTRAVENOUS | Status: DC

## 2016-11-03 NOTE — Op Note (Signed)
[place] Patient Name: Shelly Barrett Procedure Date: 11/03/2016 2:51 PM MRN: [id number] Endoscopist: Gatha Mayer , MD Age: 76 Referring MD:  Date of Birth: April 14, 1941 Gender: Female Account #: [id number] Procedure:                Colonoscopy Indications:              Abdominal pain in the left upper quadrant,                            Diarrhea, Weight loss Medicines:                Monitored Anesthesia Care Procedure:                Pre-Anesthesia Assessment:                           - Prior to the procedure, a History and Physical                            was performed, and patient medications and                            allergies were reviewed. The patient's tolerance of                            previous anesthesia was also reviewed. The risks                            and benefits of the procedure and the sedation                            options and risks were discussed with the patient.                            All questions were answered, and informed consent                            was obtained. Prior Anticoagulants: The patient has                            taken no previous anticoagulant or antiplatelet                            agents. ASA Grade Assessment: II - A patient with                            mild systemic disease. After reviewing the risks                            and benefits, the patient was deemed in                            satisfactory condition to undergo the procedure.  After obtaining informed consent, the colonoscope                            was passed under direct vision. Throughout the                            procedure, the patient's blood pressure, pulse, and                            oxygen saturations were monitored continuously. The                            Model PCF-[id number]) scope was introduced                            through the anus and advanced to the the  terminal                            ileum, with identification of the appendiceal                            orifice and IC valve. The colonoscopy was somewhat                            difficult due to a tortuous colon. Successful                            completion of the procedure was aided by using                            manual pressure. The patient tolerated the                            procedure well. The quality of the bowel                            preparation was good. The bowel preparation used                            was Miralax. The terminal ileum, ileocecal valve,                            appendiceal orifice, and rectum were photographed. Scope In: 3:11:11 PM Scope Out: 3:33:00 PM Scope Withdrawal Time: 0 hours 12 minutes 23 seconds  Total Procedure Duration: 0 hours 21 minutes 49 seconds  Findings:                 The perianal and digital rectal examinations were                            normal.                           The terminal ileum appeared normal.  A small polyp was found in the cecum. The polyp was                            sessile. The polyp was removed with a cold snare.                            Resection and retrieval were complete. Verification                            of patient identification for the specimen was                            done. Estimated blood loss was minimal.                           Multiple small and large-mouthed diverticula were                            found in the sigmoid colon. There was no evidence                            of diverticular bleeding.                           The exam was otherwise without abnormality on                            direct and retroflexion views. Complications:            No immediate complications. Estimated Blood Loss:     Estimated blood loss was minimal. Impression:               - The examined portion of the ileum was normal.                            - One small polyp in the cecum, removed with a cold                            snare. Resected and retrieved.                           - Moderate diverticulosis in the sigmoid colon.                            There was no evidence of diverticular bleeding.                           - The examination was otherwise normal on direct                            and retroflexion views. Recommendation:           - Patient has a contact number available for  emergencies. The signs and symptoms of potential                            delayed complications were discussed with the                            patient. Return to normal activities tomorrow.                            Written discharge instructions were provided to the                            patient.                           - Resume previous diet.                           - Continue present medications.                           - Await pathology results.                           - No repeat colonoscopy due to age.                           - See the other procedure note for documentation of                            additional recommendations. Gatha Mayer, MD 11/03/2016 4:41:22 PM This report has been signed electronically.

## 2016-11-03 NOTE — Progress Notes (Signed)
Called to room to assist during endoscopic procedure.  Patient ID and intended procedure confirmed with present staff. Received instructions for my participation in the procedure from the performing physician.  

## 2016-11-03 NOTE — Patient Instructions (Addendum)
You may have a hiatal hernia based upon what I saw - I will have you do an upper GI series.  I found and removed one polyp from the colon.   I appreciate the opportunity to care for you. Gatha Mayer, MD, FACG  YOU HAD AN ENDOSCOPIC PROCEDURE TODAY AT [facility]:   Refer to the procedure report that was given to you for any specific questions about what was found during the examination.  If the procedure report does not answer your questions, please call your gastroenterologist to clarify.  If you requested that your care partner not be given the details of your procedure findings, then the procedure report has been included in a sealed envelope for you to review at your convenience later.  YOU SHOULD EXPECT: Some feelings of bloating in the abdomen. Passage of more gas than usual.  Walking can help get rid of the air that was put into your GI tract during the procedure and reduce the bloating. If you had a lower endoscopy (such as a colonoscopy or flexible sigmoidoscopy) you may notice spotting of blood in your stool or on the toilet paper. If you underwent a bowel prep for your procedure, you may not have a normal bowel movement for a few days.  Please Note:  You might notice some irritation and congestion in your nose or some drainage.  This is from the oxygen used during your procedure.  There is no need for concern and it should clear up in a day or so.  SYMPTOMS TO REPORT IMMEDIATELY:   Following lower endoscopy (colonoscopy or flexible sigmoidoscopy):  Excessive amounts of blood in the stool  Significant tenderness or worsening of abdominal pains  Swelling of the abdomen that is new, acute  Fever of 100F or higher   Following upper endoscopy (EGD)  Vomiting of blood or coffee ground material  New chest pain or pain under the shoulder blades  Painful or persistently difficult swallowing  New shortness of breath  Fever of 100F or higher  Black,  tarry-looking stools  For urgent or emergent issues, a gastroenterologist can be reached at any hour by calling [phone].   DIET:  We do recommend a small meal at first, but then you may proceed to your regular diet.  Drink plenty of fluids but you should avoid alcoholic beverages for 24 hours.  ACTIVITY:  You should plan to take it easy for the rest of today and you should NOT DRIVE or use heavy machinery until tomorrow (because of the sedation medicines used during the test).    FOLLOW UP: Our staff will call the number listed on your records the next business day following your procedure to check on you and address any questions or concerns that you may have regarding the information given to you following your procedure. If we do not reach you, we will leave a message.  However, if you are feeling well and you are not experiencing any problems, there is no need to return our call.  We will assume that you have returned to your regular daily activities without incident.  If any biopsies were taken you will be contacted by phone or by letter within the next 1-3 weeks.  Please call us at [phone] if you have not heard about the biopsies in 3 weeks.   Hiatal Hernia (handout given) Diverticulosis (handout given) Polyps (handout given) Await for biopsy results  No repeat Colonoscopy due to age  SIGNATURES/CONFIDENTIALITY: You and/or your care partner have signed paperwork which will be entered into your electronic medical record.  These signatures attest to the fact that that the information above on your After Visit Summary has been reviewed and is understood.  Full responsibility of the confidentiality of this discharge information lies with you and/or your care-partner.

## 2016-11-03 NOTE — Op Note (Signed)
[facility] Patient Name: Shelly Barrett Procedure Date: 11/03/2016 2:52 PM MRN: [id number] Endoscopist: Gatha Mayer , MD Age: 76 Referring MD:  Date of Birth: 1941-01-23 Gender: Female Account #: [id number] Procedure:                Upper GI endoscopy Indications:              Abdominal pain in the left upper quadrant, Weight                            loss Medicines:                Propofol per Anesthesia, Monitored Anesthesia Care Procedure:                Pre-Anesthesia Assessment:                           - Prior to the procedure, a History and Physical                            was performed, and patient medications and                            allergies were reviewed. The patient's tolerance of                            previous anesthesia was also reviewed. The risks                            and benefits of the procedure and the sedation                            options and risks were discussed with the patient.                            All questions were answered, and informed consent                            was obtained. Prior Anticoagulants: The patient has                            taken no previous anticoagulant or antiplatelet                            agents. ASA Grade Assessment: II - A patient with                            mild systemic disease. After reviewing the risks                            and benefits, the patient was deemed in                            satisfactory condition to undergo the procedure.  After obtaining informed consent, the endoscope was                            passed under direct vision. Throughout the                            procedure, the patient's blood pressure, pulse, and                            oxygen saturations were monitored continuously. The                            Model GIF-[id number]) scope was introduced                            through the mouth, and  advanced to the second part                            of duodenum. The upper GI endoscopy was                            accomplished without difficulty. The patient                            tolerated the procedure well. Scope In: Scope Out: Findings:                 A large hiatal hernia was present. Suspected as                            could see diaphragmatic impingement 15 cm below Z                            line.                           The cardia and gastric fundus were normal on                            retroflexion.                           The exam was otherwise without abnormality. Complications:            No immediate complications. Estimated Blood Loss:     Estimated blood loss: none. Impression:               - Large hiatal hernia.                           - The examination was otherwise normal.                           - No specimens collected. Recommendation:           - Patient has a contact number available for  emergencies. The signs and symptoms of potential                            delayed complications were discussed with the                            patient. Return to normal activities tomorrow.                            Written discharge instructions were provided to the                            patient.                           - Resume previous diet.                           - Continue present medications.                           - See the other procedure note for documentation of                            additional recommendations.                           - Do an upper GI series at appointment to be                            scheduled. ? hiatal hernia - evaluate LUQ pain and                            weight loss Gatha Mayer, MD 11/03/2016 3:44:34 PM This report has been signed electronically.

## 2016-11-04 ENCOUNTER — Telehealth: Payer: Self-pay | Admitting: *Deleted

## 2016-11-04 NOTE — Telephone Encounter (Signed)
  Follow up Call-  Call back number 11/03/2016  Post procedure Call Back phone  # [phone]  Permission to leave phone message Yes  Some recent data might be hidden     Patient questions:  Do you have a fever, pain , or abdominal swelling? No. Pain Score  0 *  Have you tolerated food without any problems? Yes.    Have you been able to return to your normal activities? Yes.    Do you have any questions about your discharge instructions: Diet   No. Medications  No. Follow up visit  No.  Do you have questions or concerns about your Care? No.  Actions: * If pain score is 4 or above: No action needed, pain <4.

## 2016-11-08 ENCOUNTER — Other Ambulatory Visit: Payer: Self-pay

## 2016-11-08 DIAGNOSIS — R1012 Left upper quadrant pain: Secondary | ICD-10-CM

## 2016-11-08 DIAGNOSIS — K449 Diaphragmatic hernia without obstruction or gangrene: Secondary | ICD-10-CM

## 2016-11-08 NOTE — Progress Notes (Signed)
Patient is scheduled for UGI series on 11/15/16 10:30 she needs to arrive at 10:15 at [facility]  and be NPO after midnight. She is notified of the appt details

## 2016-11-09 ENCOUNTER — Encounter: Payer: Self-pay | Admitting: Internal Medicine

## 2016-11-09 NOTE — Progress Notes (Signed)
Diminutive cecal adenoma No recall - age

## 2016-11-15 ENCOUNTER — Ambulatory Visit (HOSPITAL_COMMUNITY)
Admission: RE | Admit: 2016-11-15 | Discharge: 2016-11-15 | Disposition: A | Discharge status: Home / Self Care | Payer: PPO | Source: Ambulatory Visit | Attending: Internal Medicine | Admitting: Internal Medicine

## 2016-11-15 DIAGNOSIS — R1012 Left upper quadrant pain: Secondary | ICD-10-CM | POA: Diagnosis not present

## 2016-11-15 DIAGNOSIS — K449 Diaphragmatic hernia without obstruction or gangrene: Secondary | ICD-10-CM

## 2016-11-15 DIAGNOSIS — R109 Unspecified abdominal pain: Secondary | ICD-10-CM | POA: Diagnosis not present

## 2016-11-15 NOTE — Progress Notes (Signed)
So she does not have a hiatal hernia like I thought from EGD but I do think scoliosis is distorting the position of her stomach and that could be causing her to have abdominal pain Her labs all look ok which is good news I suggest she follow-up with Dr. Melford Aase - if she is worsening then next step is CT abd/pelvis with contrast but so far all info we have is overall good news I.e. Nothing life threatening seen  Can use Imodium prn diarrhea and see me prn  I have cced PCP

## 2016-11-23 ENCOUNTER — Ambulatory Visit: Payer: PPO | Admitting: Podiatry

## 2016-12-31 ENCOUNTER — Ambulatory Visit (INDEPENDENT_AMBULATORY_CARE_PROVIDER_SITE_OTHER): Payer: PPO | Admitting: Internal Medicine

## 2016-12-31 ENCOUNTER — Encounter: Payer: Self-pay | Admitting: Internal Medicine

## 2016-12-31 VITALS — BP 114/60 | HR 72 | Temp 97.9°F | Resp 16 | Ht 63.5 in | Wt 91.0 lb

## 2016-12-31 DIAGNOSIS — Z0000 Encounter for general adult medical examination without abnormal findings: Secondary | ICD-10-CM

## 2016-12-31 DIAGNOSIS — R7303 Prediabetes: Secondary | ICD-10-CM

## 2016-12-31 DIAGNOSIS — Z136 Encounter for screening for cardiovascular disorders: Secondary | ICD-10-CM | POA: Diagnosis not present

## 2016-12-31 DIAGNOSIS — Z681 Body mass index (BMI) 19 or less, adult: Secondary | ICD-10-CM | POA: Insufficient documentation

## 2016-12-31 DIAGNOSIS — Z79899 Other long term (current) drug therapy: Secondary | ICD-10-CM

## 2016-12-31 DIAGNOSIS — Z0001 Encounter for general adult medical examination with abnormal findings: Secondary | ICD-10-CM

## 2016-12-31 DIAGNOSIS — I10 Essential (primary) hypertension: Secondary | ICD-10-CM | POA: Diagnosis not present

## 2016-12-31 DIAGNOSIS — E782 Mixed hyperlipidemia: Secondary | ICD-10-CM | POA: Diagnosis not present

## 2016-12-31 DIAGNOSIS — E559 Vitamin D deficiency, unspecified: Secondary | ICD-10-CM

## 2016-12-31 LAB — BASIC METABOLIC PANEL WITH GFR
BUN: 19 mg/dL (ref 7–25)
CHLORIDE: 104 mmol/L (ref 98–110)
CO2: 26 mmol/L (ref 20–31)
CREATININE: 0.6 mg/dL (ref 0.60–0.93)
Calcium: 9 mg/dL (ref 8.6–10.4)
GFR, Est African American: >89 mL/min (ref >=60)
GFR, Est Non African American: >89 mL/min (ref >=60)
Glucose, Bld: 98 mg/dL (ref 65–99)
POTASSIUM: 3.6 mmol/L (ref 3.5–5.3)
SODIUM: 141 mmol/L (ref 135–146)

## 2016-12-31 LAB — CBC WITH DIFFERENTIAL/PLATELET
BASOS ABS: 0 {cells}/uL (ref 0–200)
Basophils Relative: 0 %
EOS ABS: 54 {cells}/uL (ref 15–500)
Eosinophils Relative: 2 %
HCT: 38.6 % (ref 35.0–45.0)
HEMOGLOBIN: 12.6 g/dL (ref 11.7–15.5)
LYMPHS ABS: 972 {cells}/uL (ref 850–3900)
Lymphocytes Relative: 36 %
MCH: 29.9 pg (ref 27.0–33.0)
MCHC: 32.6 g/dL (ref 32.0–36.0)
MCV: 91.7 fL (ref 80.0–100.0)
MPV: 9.5 fL (ref 7.5–12.5)
Monocytes Absolute: 270 cells/uL (ref 200–950)
Monocytes Relative: 10 %
NEUTROS ABS: 1404 {cells}/uL — AB (ref 1500–7800)
Neutrophils Relative %: 52 %
Platelets: 255 10*3/uL (ref 140–400)
RBC: 4.21 MIL/uL (ref 3.80–5.10)
RDW: 14 % (ref 11.0–15.0)
WBC: 2.7 10*3/uL — ABNORMAL LOW (ref 3.8–10.8)

## 2016-12-31 LAB — HEPATIC FUNCTION PANEL
ALK PHOS: 129 U/L (ref 33–130)
ALT: 14 U/L (ref 6–29)
AST: 24 U/L (ref 10–35)
Albumin: 4.3 g/dL (ref 3.6–5.1)
BILIRUBIN DIRECT: 0.1 mg/dL (ref <=0.2)
Indirect Bilirubin: 0.4 mg/dL (ref 0.2–1.2)
Total Bilirubin: 0.5 mg/dL (ref 0.2–1.2)
Total Protein: 6.6 g/dL (ref 6.1–8.1)

## 2016-12-31 LAB — TSH: TSH: 1.14 m[IU]/L

## 2016-12-31 LAB — LIPID PANEL
Cholesterol: 185 mg/dL (ref <200)
HDL: 102 mg/dL (ref >50)
LDL CALC: 71 mg/dL (ref <100)
Total CHOL/HDL Ratio: 1.8 Ratio (ref <5.0)
Triglycerides: 58 mg/dL (ref <150)
VLDL: 12 mg/dL (ref <30)

## 2016-12-31 NOTE — Patient Instructions (Signed)

## 2016-12-31 NOTE — Progress Notes (Signed)
[facility] [name], M.D.    Uvaldo Bristle. Silverio Lay, P.A.-C      Starlyn Skeans, P.A.-C  [facility]                [address] [place] [address] Telephone [phone] Telefax [phone]  Annual Screening/Preventative Visit & Comprehensive Evaluation &  Examination     This very nice 76 y.o. MWF presents for a Screening/Preventative Visit & comprehensive evaluation and management of multiple medical co-morbidities.  Patient has been followed for labile HTN, Prediabetes, Hyperlipidemia and Vitamin D Deficiency.     Patient has had a 9-10# weight loss since Nov 2016 (wt 102#) and with c/o Wt Loss & Diarrhea she had neg w/u for malabsorption and Nl EGD/Colonoscopy by Dr Carlean Purl. Thyroid studies , B12, iron studies and carotene levels have been normal.  Patient had avoided dairy products for fear of lactose intolerance, but now reports no dairy intolerance and has liberalized her diet to include white rice/potatoes, breads & pasta. She also had a negative Cystoscopy & CT Urogram per Dr Matilde Sprang.       Patient has been monitored expectantly for hx/o HTN predates since 2002. Patient's BP has been controlled at home and patient denies any cardiac symptoms as chest pain, palpitations, shortness of breath, dizziness or ankle swelling. Today's BP is at goal - 114/60.      Patient's hyperlipidemia is controlled with diet.  Last lipids were at goal: Lab Results  Component Value Date   CHOL 195 10/06/2016   HDL 106 10/06/2016   LDLCALC 78 10/06/2016   TRIG 53 10/06/2016   CHOLHDL 1.8 10/06/2016      Patient has hx/o prediabetes & Insulin Resistance  predating since 2012 with A1c 6.1%/2012 and 5.9%/2014 and patient denies reactive hypoglycemic symptoms, visual blurring, diabetic polys, or paresthesias. Last A1c was at goal: Lab Results  Component Value Date   HGBA1C 5.3 10/06/2016      Finally,  patient has history of Vitamin D Deficiency ("24" in 2008) and last Vitamin D was low (goal 70-100): Lab Results  Component Value Date   VD25OH 48 10/06/2016   Current Outpatient Prescriptions on File Prior to Visit  Medication Sig  . ALPRAZolam (XANAX XR) 0.5 MG 24 hr tablet Take 0.5 mg by mouth daily as needed.  Marland Kitchen aspirin 81 MG tablet Take 81 mg by mouth daily.  Marland Kitchen CALCIUM PO Take 250 mg by mouth.  . cholecalciferol (VITAMIN D) 1000 UNITS tablet Take 6,000 Units by mouth daily.  . Biotin 1 MG CAPS Take by mouth.   Current Facility-Administered Medications on File Prior to Visit  Medication  . 0.9 %  sodium chloride infusion   Allergies  Allergen Reactions  . Betadine [Povidone Iodine]   . Ciprocinonide [Fluocinolone]   . Ciprofloxacin Hcl     Hives  . Flexeril [Cyclobenzaprine]   . Ibuprofen   . Naprosyn [Naproxen]   . Penicillins     REACTION: rash  . Shellfish Allergy   . Sodium Benzoate [Nutritional Supplements]   . Iodinated Diagnostic Agents Rash   Past Medical History:  Diagnosis Date  . Arthritis   . Hyperlipidemia   . Prediabetes   . Scoliosis   . Sigmoid diverticulitis   . Vitamin D deficiency    Health Maintenance  Topic Date Due  . MAMMOGRAM  09/24/2017  .  TETANUS/TDAP  11/17/2020  . COLONOSCOPY  11/03/2026  . INFLUENZA VACCINE  Completed  . DEXA SCAN  Completed  . PNA vac Low Risk Adult  Completed   Immunization History  Administered Date(s) Administered  . Influenza, High Dose Seasonal PF 07/30/2014, 06/25/2015, 07/01/2016  . Influenza-Unspecified 06/20/2013  . Pneumococcal Conjugate-13 09/18/2014  . Pneumococcal-Unspecified 09/16/2009  . Tdap 11/17/2010  . Zoster 09/16/2009   Past Surgical History:  Procedure Laterality Date  . [place], 2012   implants  . COLONOSCOPY    . TONSILLECTOMY AND ADENOIDECTOMY    . TUBAL LIGATION     Family History  Problem Relation Age of Onset  . Heart disease Mother   . Hypertension Mother    . Heart disease Father   . Cancer Father     throat and prostate  . Arthritis Sister     RA  . Hyperlipidemia Brother   . Colon cancer Neg Hx   . Stomach cancer Neg Hx   . Rectal cancer Neg Hx   . Esophageal cancer Neg Hx   . Alcohol abuse Neg Hx   . Liver cancer Neg Hx    Social History  Substance Use Topics  . Smoking status: Never Smoker  . Smokeless tobacco: Never Used  . Alcohol use No    ROS Constitutional: Denies fever, chills, weight loss/gain, headaches, insomnia,  night sweats, and change in appetite. Does c/o fatigue. Eyes: Denies redness, blurred vision, diplopia, discharge, itchy, watery eyes.  ENT: Denies discharge, congestion, post nasal drip, epistaxis, sore throat, earache, hearing loss, dental pain, Tinnitus, Vertigo, Sinus pain, snoring.  Cardio: Denies chest pain, palpitations, irregular heartbeat, syncope, dyspnea, diaphoresis, orthopnea, PND, claudication, edema Respiratory: denies cough, dyspnea, DOE, pleurisy, hoarseness, laryngitis, wheezing.  Gastrointestinal: Denies dysphagia, heartburn, reflux, water brash, pain, cramps, nausea, vomiting, bloating, diarrhea, constipation, hematemesis, melena, hematochezia, jaundice, hemorrhoids Genitourinary: Denies dysuria, frequency, urgency, nocturia, hesitancy, discharge, hematuria, flank pain Breast: Breast lumps, nipple discharge, bleeding.  Musculoskeletal: Denies arthralgia, myalgia, stiffness, Jt. Swelling, pain, limp, and strain/sprain. Denies falls. Skin: Denies puritis, rash, hives, warts, acne, eczema, changing in skin lesion Neuro: No weakness, tremor, incoordination, spasms, paresthesia, pain Psychiatric: Denies confusion, memory loss, sensory loss. Denies Depression. Endocrine: Denies change in weight, skin, hair change, nocturia, and paresthesia, diabetic polys, visual blurring, hyper / hypo glycemic episodes.  Heme/Lymph: No excessive bleeding, bruising, enlarged lymph nodes.  Physical Exam  BP  114/60   Pulse 72   Temp 97.9 F (36.6 C)   Resp 16   Ht 5' 3.5" (1.613 m)   Wt 91 lb (41.3 kg)   BMI 15.87 kg/m   General Appearance: Very thin and in no apparent distress.  Eyes: PERRLA, EOMs, conjunctiva no swelling or erythema, normal fundi and vessels. Sinuses: No frontal/maxillary tenderness ENT/Mouth: EACs patent / TMs  nl. Nares clear without erythema, swelling, mucoid exudates. Oral hygiene is good. No erythema, swelling, or exudate. Tongue normal, non-obstructing. Tonsils not swollen or erythematous. Hearing normal.  Neck: Supple, thyroid normal. No bruits, nodes or JVD. Respiratory: Respiratory effort normal.  BS equal and clear bilateral without rales, rhonci, wheezing or stridor. Cardio: Heart sounds are normal with regular rate and rhythm and no murmurs, rubs or gallops. Peripheral pulses are normal and equal bilaterally without edema. No aortic or femoral bruits. Chest: symmetric with normal excursions and percussion. Breasts: Symmetric, without lumps, nipple discharge, retractions, or fibrocystic changes.  Abdomen: Flat, soft with bowel sounds active. Nontender, no guarding, rebound, hernias, masses,  or organomegaly.  Lymphatics: Non tender without lymphadenopathy.  Genitourinary:  Musculoskeletal: Full ROM all peripheral extremities, joint stability, 5/5 strength, and normal gait. Skin: Warm and dry without rashes, lesions, cyanosis, clubbing or  ecchymosis.  Neuro: Cranial nerves intact, reflexes equal bilaterally. Normal muscle tone, no cerebellar symptoms. Sensation intact.  Pysch: Alert and oriented X 3, normal affect, Insight and Judgment appropriate.   Assessment and Plan  1. Annual Preventative Screening Examination   2. Essential hypertension  - Microalbumin / creatinine urine ratio - EKG 12-Lead - Urinalysis, Routine w reflex microscopic - CBC with Differential/Platelet - BASIC METABOLIC PANEL WITH GFR - Magnesium - TSH  3. Mixed  hyperlipidemia  - EKG 12-Lead - Hepatic function panel - Lipid panel - TSH  4. Prediabetes  - EKG 12-Lead - Insulin, random  5. Vitamin D deficiency   6. Body mass index (BMI) less than 16.5   7. Screening for ischemic heart disease  - EKG 12-Lead  8. Medication management  - Urinalysis, Routine w reflex microscopic - CBC with Differential/Platelet - BASIC METABOLIC PANEL WITH GFR - Hepatic function panel - Magnesium - Lipid panel - TSH - Insulin, random       Continue prudent diet as discussed, weight control, BP monitoring, regular exercise, and medications. Discussed med's effects and SE's. Screening labs and tests as requested with regular follow-up as recommended. Over 40 minutes of exam, counseling, chart review and high complex critical decision making was performed.

## 2017-01-01 LAB — URINALYSIS, ROUTINE W REFLEX MICROSCOPIC
Bilirubin Urine: NEGATIVE
GLUCOSE, UA: NEGATIVE
Ketones, ur: NEGATIVE
LEUKOCYTES UA: NEGATIVE
Nitrite: NEGATIVE
PH: 5.5 (ref 5.0–8.0)
Protein, ur: NEGATIVE
SPECIFIC GRAVITY, URINE: 1.019 (ref 1.001–1.035)

## 2017-01-01 LAB — URINALYSIS, MICROSCOPIC ONLY
Bacteria, UA: NONE SEEN [HPF]
Casts: NONE SEEN [LPF]
RBC / HPF: NONE SEEN RBC/HPF (ref <=2)
Squamous Epithelial / LPF: NONE SEEN [HPF] (ref <=5)
YEAST: NONE SEEN [HPF]

## 2017-01-01 LAB — MAGNESIUM: MAGNESIUM: 2.1 mg/dL (ref 1.5–2.5)

## 2017-01-01 LAB — MICROALBUMIN / CREATININE URINE RATIO
Creatinine, Urine: 92 mg/dL (ref 20–320)
Microalb Creat Ratio: 58 mcg/mg creat — ABNORMAL HIGH (ref <30)
Microalb, Ur: 5.3 mg/dL

## 2017-01-03 LAB — INSULIN, RANDOM: INSULIN: 40.3 u[IU]/mL — AB (ref 2.0–19.6)

## 2017-01-06 DIAGNOSIS — R069 Unspecified abnormalities of breathing: Secondary | ICD-10-CM | POA: Diagnosis not present

## 2017-03-18 DIAGNOSIS — H2513 Age-related nuclear cataract, bilateral: Secondary | ICD-10-CM | POA: Diagnosis not present

## 2017-04-12 ENCOUNTER — Ambulatory Visit: Payer: Self-pay | Admitting: Internal Medicine

## 2017-06-23 DIAGNOSIS — E441 Mild protein-calorie malnutrition: Secondary | ICD-10-CM | POA: Insufficient documentation

## 2017-06-23 DIAGNOSIS — E43 Unspecified severe protein-calorie malnutrition: Secondary | ICD-10-CM | POA: Insufficient documentation

## 2017-06-23 NOTE — Progress Notes (Deleted)
MEDICARE ANNUAL WELLNESS VISIT AND FOLLOW UP  Assessment:     Over 30 minutes of exam, counseling, chart review, and critical decision making was performed Future Appointments Date Time Provider [place]  06/27/2017 10:30 AM Vicie Mutters, PA-C [facility] None  01/20/2018 9:00 AM [name], MD [facility] None     Plan:   During the course of the visit the patient was educated and counseled about appropriate screening and preventive services including:    Pneumococcal vaccine   Influenza vaccine  Td vaccine  Prevnar 13  Screening electrocardiogram  Screening mammography  Bone densitometry screening  Colorectal cancer screening  Diabetes screening  Glaucoma screening  Nutrition counseling   Advanced directives: given info/requested copies  Subjective:   Shelly Barrett is a 76 y.o. female who presents for Medicare Annual Wellness Visit and 3 month follow up on hypertension, prediabetes, hyperlipidemia, vitamin D def.   Her blood pressure has been controlled at home, today their BP is   She does workout. She denies chest pain, shortness of breath, dizziness.  She is not on cholesterol medication and denies myalgias. Her cholesterol is at goal. The cholesterol last visit was:   Lab Results  Component Value Date   CHOL 185 12/31/2016   HDL 102 12/31/2016   LDLCALC 71 12/31/2016   TRIG 58 12/31/2016   CHOLHDL 1.8 12/31/2016   She has been working on diet and exercise for her prediabetes which has been present and controlled several years b diet and exercise alone.  No evidence of polydipsia, polyphagia, or polyuria.   Lab Results  Component Value Date   HGBA1C 5.3 10/06/2016   Patient is on Vitamin D supplement. Lab Results  Component Value Date   VD25OH 48 10/06/2016     BMI is There is no height or weight on file to calculate BMI., she is working on diet and exercise. Wt Readings from Last 3 Encounters:  12/31/16 91 lb (41.3 kg)   11/03/16 91 lb (41.3 kg)  10/06/16 89 lb 6.4 oz (40.6 kg)    Medication Review Current Outpatient Prescriptions on File Prior to Visit  Medication Sig Dispense Refill  . ALPRAZolam (XANAX XR) 0.5 MG 24 hr tablet Take 0.5 mg by mouth daily as needed.    Marland Kitchen aspirin 81 MG tablet Take 81 mg by mouth daily.    . Biotin 1 MG CAPS Take by mouth.    Marland Kitchen CALCIUM PO Take 250 mg by mouth.    . cholecalciferol (VITAMIN D) 1000 UNITS tablet Take 6,000 Units by mouth daily.     No current facility-administered medications on file prior to visit.     Current Problems (verified) Patient Active Problem List   Diagnosis Date Noted  . Body mass index (BMI) less than 16.5 12/31/2016  . Hematuria, gross 07/27/2016  . Fibrocystic breast changes, bilateral 08/28/2015  . Medication management 11/25/2013  . Hypertension   . Hyperlipidemia   . Prediabetes   . Vitamin D deficiency   . Bloodgood disease 12/16/2011  . Carotid stenosis 05/28/2010    Screening Tests Immunization History  Administered Date(s) Administered  . Influenza, High Dose Seasonal PF 07/30/2014, 06/25/2015, 07/01/2016  . Influenza-Unspecified 06/20/2013  . Pneumococcal Conjugate-13 09/18/2014  . Pneumococcal-Unspecified 09/16/2009  . Tdap 11/17/2010  . Zoster 09/16/2009    Preventative care: Last colonoscopy: 2009 Last mammogram: 09/2015 Last pap smear/pelvic exam: remote   DEXA:12/2014  Prior vaccinations: TD or Tdap: 2012  Influenza:2016  Pneumococcal: 2010 Prevnar13: 2015 Shingles/Zostavax: 2010  Names of Other Physician/Practitioners you currently use: 1. [facility]- here for primary care 2. Dr. Delman Cheadle, eye doctor, last visit 2016 3. Dr. Mariea Clonts, dentist, last visit 2017 Patient Care Team: [name], MD as PCP - General (Internal Medicine) Gatha Mayer, MD as Consulting Physician (Gastroenterology)  Allergies Allergies  Allergen Reactions  . Betadine [Povidone  Iodine]   . Ciprocinonide [Fluocinolone]   . Ciprofloxacin Hcl     Hives  . Flexeril [Cyclobenzaprine]   . Ibuprofen   . Naprosyn [Naproxen]   . Penicillins     REACTION: rash  . Shellfish Allergy   . Sodium Benzoate [Nutritional Supplements]   . Iodinated Diagnostic Agents Rash    SURGICAL HISTORY She  has a past surgical history that includes Breast surgery (1980, 2012); Tubal ligation; Tonsillectomy and adenoidectomy; and Colonoscopy. FAMILY HISTORY Her family history includes Arthritis in her sister; Cancer in her father; Heart disease in her father and mother; Hyperlipidemia in her brother; Hypertension in her mother. SOCIAL HISTORY She  reports that she has never smoked. She has never used smokeless tobacco. She reports that she does not drink alcohol or use drugs.  MEDICARE WELLNESS OBJECTIVES: Physical activity:   Cardiac risk factors:   Depression/mood screen:   Depression screen [facility] 2/9 12/31/2016  Decreased Interest 0  Down, Depressed, Hopeless 0  PHQ - 2 Score 0    ADLs:  In your present state of health, do you have any difficulty performing the following activities: 12/31/2016 10/06/2016  Hearing? N N  Vision? N N  Difficulty concentrating or making decisions? N N  Walking or climbing stairs? N N  Dressing or bathing? N N  Doing errands, shopping? N N  Some recent data might be hidden     Cognitive Testing  Alert? Yes  Normal Appearance?Yes  Oriented to person? Yes  Place? Yes   Time? Yes  Recall of three objects?  Yes  Can perform simple calculations? Yes  Displays appropriate judgment?Yes  Can read the correct time from a watch face?Yes  EOL planning:     Objective:   There were no vitals filed for this visit. There is no height or weight on file to calculate BMI.  General appearance: alert, no distress, WD/WN,  female HEENT: normocephalic, sclerae anicteric, TMs pearly, nares patent, no discharge or erythema, pharynx normal Oral cavity: MMM, no  lesions Neck: supple, no lymphadenopathy, no thyromegaly, no masses Heart: RRR, normal S1, S2, no murmurs Lungs: CTA bilaterally, no wheezes, rhonchi, or rales Abdomen: +bs, soft, non tender, non distended, no masses, no hepatomegaly, no splenomegaly Musculoskeletal: nontender, no swelling, no obvious deformity Extremities: no edema, no cyanosis, no clubbing Pulses: 2+ symmetric, upper and lower extremities, normal cap refill Neurological: alert, oriented x 3, CN2-12 intact, strength normal upper extremities and lower extremities, sensation normal throughout, DTRs 2+ throughout, no cerebellar signs, gait normal Psychiatric: normal affect, behavior normal, pleasant  Breast: defer Gyn: defer Rectal: defer   Medicare Attestation I have personally reviewed: The patient's medical and social history Their use of alcohol, tobacco or illicit drugs Their current medications and supplements The patient's functional ability including ADLs,fall risks, home safety risks, cognitive, and hearing and visual impairment Diet and physical activities Evidence for depression or mood disorders  The patient's weight, height, BMI, and visual acuity have been recorded in the chart.  I have made referrals, counseling, and provided education to the patient based on review of the above and I have provided  the patient with a written personalized care plan for preventive services.     Vicie Mutters, PA-C   06/23/2017

## 2017-06-27 ENCOUNTER — Ambulatory Visit: Payer: Self-pay | Admitting: Physician Assistant

## 2017-06-27 ENCOUNTER — Ambulatory Visit: Payer: Self-pay | Admitting: Internal Medicine

## 2017-07-08 NOTE — Progress Notes (Signed)
MEDICARE ANNUAL WELLNESS VISIT AND FOLLOW UP  Assessment:    Carotid stenosis, unspecified laterality -followed with yearly carotid dopplers -cholesterol at goal   Essential hypertension -Dash diet -cont to monitor at home -exercise as tolerated  Hyperlipidemia -at goal -cont diet and exercise as tolerated  Prediabetes -cont diet and exercise as tolerated   Vitamin D deficiency -cont supplement -cont calcium with it   Medication management -cont biannual lab testing   Fibrocystic breast changes, bilateral -cont yearly mammograms  Medicare annual wellness visit, subsequent -due next year  Over 30 minutes of exam, counseling, chart review, and critical decision making was performed Future Appointments Date Time Provider [place]  01/20/2018 9:00 AM [name], MD [facility] None     Plan:   During the course of the visit the patient was educated and counseled about appropriate screening and preventive services including:    Pneumococcal vaccine   Influenza vaccine  Td vaccine  Prevnar 13  Screening electrocardiogram  Screening mammography  Bone densitometry screening  Colorectal cancer screening  Diabetes screening  Glaucoma screening  Nutrition counseling   Advanced directives: given info/requested copies    Subjective:   Shelly Barrett is a 76 y.o. female who presents for Medicare Annual Wellness Visit and 3 month follow up on hypertension, prediabetes, hyperlipidemia, vitamin D def.   She is complaining of constipation, has stopped calcium, stopped milk and is helping some, no pain with BM, no black stool or blood in stool.  Her blood pressure has been controlled at home, today their BP is BP: 124/76 She does workout. She denies chest pain, shortness of breath, dizziness.  She is not on cholesterol medication and denies myalgias. Her cholesterol is at goal. The cholesterol last visit was:   Lab Results  Component Value  Date   CHOL 185 12/31/2016   HDL 102 12/31/2016   LDLCALC 71 12/31/2016   TRIG 58 12/31/2016   CHOLHDL 1.8 12/31/2016   She has been working on diet and exercise for her prediabetes which has been present and controlled several years b diet and exercise alone.  No evidence of polydipsia, polyphagia, or polyuria.   Lab Results  Component Value Date   HGBA1C 5.3 10/06/2016   Last GFR Lab Results  Component Value Date   GFRNONAA >89 12/31/2016   Patient is on Vitamin D supplement. Lab Results  Component Value Date   VD25OH 48 10/06/2016     BMI is Body mass index is 15.03 kg/m., she is working on diet and exercise. Wt Readings from Last 3 Encounters:  07/12/17 86 lb 3.2 oz (39.1 kg)  12/31/16 91 lb (41.3 kg)  11/03/16 91 lb (41.3 kg)     Medication Review Current Outpatient Prescriptions on File Prior to Visit  Medication Sig Dispense Refill  . ALPRAZolam (XANAX XR) 0.5 MG 24 hr tablet Take 0.5 mg by mouth daily as needed.    . Biotin 1 MG CAPS Take by mouth.    Marland Kitchen CALCIUM PO Take 250 mg by mouth.    . cholecalciferol (VITAMIN D) 1000 UNITS tablet Take 6,000 Units by mouth daily.     No current facility-administered medications on file prior to visit.     Current Problems (verified) Patient Active Problem List   Diagnosis Date Noted  . Malnutrition of mild degree ([place]) 06/23/2017  . Body mass index (BMI) less than 16.5 12/31/2016  . Hematuria, gross 07/27/2016  . Fibrocystic breast changes, bilateral 08/28/2015  . Medication management 11/25/2013  .  Hypertension   . Hyperlipidemia   . Prediabetes   . Vitamin D deficiency   . Carotid stenosis 05/28/2010    Screening Tests Immunization History  Administered Date(s) Administered  . Influenza, High Dose Seasonal PF 07/30/2014, 06/25/2015, 07/01/2016, 07/12/2017  . Influenza-Unspecified 06/20/2013  . Pneumococcal Conjugate-13 09/18/2014  . Pneumococcal-Unspecified 09/16/2009  . Tdap 11/17/2010  . Zoster  09/16/2009    Preventative care: Last colonoscopy: 2009 declines another Last mammogram: 09/2015 declines due to pain, does self breast exams Last pap smear/pelvic exam: remote   DEXA:12/2014 declines another Ct AB 09/2015 MRI lumbar 2002  Prior vaccinations: TD or Tdap: 2012  Influenza TODAY Pneumococcal: 2010 Prevnar13: 2015 Shingles/Zostavax: 2010  Names of Other Physician/Practitioners you currently use: 1. [facility]- here for primary care 2. Dr. Delman Cheadle, eye doctor, last visit July 2018 3. Dr. Mariea Clonts, dentist, last visit 2018 Patient Care Team: [name], MD as PCP - General (Internal Medicine) Gatha Mayer, MD as Consulting Physician (Gastroenterology)  Allergies Allergies  Allergen Reactions  . Betadine [Povidone Iodine]   . Ciprocinonide [Fluocinolone]   . Ciprofloxacin Hcl     Hives  . Flexeril [Cyclobenzaprine]   . Ibuprofen   . Naprosyn [Naproxen]   . Penicillins     REACTION: rash  . Shellfish Allergy   . Sodium Benzoate [Nutritional Supplements]   . Iodinated Diagnostic Agents Rash    SURGICAL HISTORY She  has a past surgical history that includes Breast surgery (1980, 2012); Tubal ligation; Tonsillectomy and adenoidectomy; and Colonoscopy. FAMILY HISTORY Her family history includes Arthritis in her sister; Cancer in her father; Heart disease in her father and mother; Hyperlipidemia in her brother; Hypertension in her mother. SOCIAL HISTORY She  reports that she has never smoked. She has never used smokeless tobacco. She reports that she does not drink alcohol or use drugs.  MEDICARE WELLNESS OBJECTIVES: Tobacco use: She does not smoke.  Patient is not a former smoker. If yes, counseling given Alcohol Current alcohol use: none Diet: well balanced Physical activity: Current Exercise Habits: The patient does not participate in regular exercise at present (yardwork and housework) Cardiac risk factors:  Cardiac Risk Factors include: advanced age (>89men, >90 women);sedentary lifestyle Depression/mood screen:   Depression screen [facility] 2/9 07/12/2017  Decreased Interest 0  Down, Depressed, Hopeless 0  PHQ - 2 Score 0    ADLs:  In your present state of health, do you have any difficulty performing the following activities: 07/12/2017 12/31/2016  Hearing? N N  Vision? N N  Difficulty concentrating or making decisions? N N  Walking or climbing stairs? N N  Dressing or bathing? N N  Doing errands, shopping? N N  Some recent data might be hidden     Cognitive Testing  Alert? Yes  Normal Appearance?Yes  Oriented to person? Yes  Place? Yes   Time? Yes  Recall of three objects?  Yes  Can perform simple calculations? Yes  Displays appropriate judgment?Yes  Can read the correct time from a watch face?Yes  EOL planning: Does Patient Have a Medical Advance Directive?: Yes Type of Advance Directive: Healthcare Power of Attorney, Living will Copy of [place] in Chart?: No - copy requested   Objective:   Today's Vitals   07/12/17 1055  BP: 124/76  Pulse: 76  Resp: 14  Temp: (!) 97.5 F (36.4 C)  SpO2: 95%  Weight: 86 lb 3.2 oz (39.1 kg)  Height: 5' 3.5" (1.613 m)  PainSc:  0-No pain   Body mass index is 15.03 kg/m.  General appearance: alert, no distress, WD/WN,  female HEENT: normocephalic, sclerae anicteric, TMs pearly, nares patent, no discharge or erythema, pharynx normal Oral cavity: MMM, no lesions Neck: supple, no lymphadenopathy, no thyromegaly, no masses Heart: RRR, normal S1, S2, no murmurs Lungs: CTA bilaterally, no wheezes, rhonchi, or rales Abdomen: +bs, soft, non tender, non distended, no masses, no hepatomegaly, no splenomegaly Musculoskeletal: nontender, no swelling, no obvious deformity Extremities: no edema, no cyanosis, no clubbing Pulses: 2+ symmetric, upper and lower extremities, normal cap refill Neurological: alert, oriented x 3, CN2-12  intact, strength normal upper extremities and lower extremities, sensation normal throughout, DTRs 2+ throughout, no cerebellar signs, gait normal Psychiatric: normal affect, behavior normal, pleasant  Breast: defer Gyn: defer Rectal: defer   Medicare Attestation I have personally reviewed: The patient's medical and social history Their use of alcohol, tobacco or illicit drugs Their current medications and supplements The patient's functional ability including ADLs,fall risks, home safety risks, cognitive, and hearing and visual impairment Diet and physical activities Evidence for depression or mood disorders  The patient's weight, height, BMI, and visual acuity have been recorded in the chart.  I have made referrals, counseling, and provided education to the patient based on review of the above and I have provided the patient with a written personalized care plan for preventive services.     Vicie Mutters, PA-C   07/12/2017

## 2017-07-12 ENCOUNTER — Ambulatory Visit (INDEPENDENT_AMBULATORY_CARE_PROVIDER_SITE_OTHER): Payer: PPO | Admitting: Physician Assistant

## 2017-07-12 ENCOUNTER — Encounter: Payer: Self-pay | Admitting: Physician Assistant

## 2017-07-12 VITALS — BP 124/76 | HR 76 | Temp 97.5°F | Resp 14 | Ht 63.5 in | Wt 86.2 lb

## 2017-07-12 DIAGNOSIS — E559 Vitamin D deficiency, unspecified: Secondary | ICD-10-CM | POA: Diagnosis not present

## 2017-07-12 DIAGNOSIS — E782 Mixed hyperlipidemia: Secondary | ICD-10-CM | POA: Diagnosis not present

## 2017-07-12 DIAGNOSIS — R7303 Prediabetes: Secondary | ICD-10-CM

## 2017-07-12 DIAGNOSIS — N6012 Diffuse cystic mastopathy of left breast: Secondary | ICD-10-CM

## 2017-07-12 DIAGNOSIS — Z0001 Encounter for general adult medical examination with abnormal findings: Secondary | ICD-10-CM

## 2017-07-12 DIAGNOSIS — N6011 Diffuse cystic mastopathy of right breast: Secondary | ICD-10-CM

## 2017-07-12 DIAGNOSIS — Z79899 Other long term (current) drug therapy: Secondary | ICD-10-CM

## 2017-07-12 DIAGNOSIS — Z681 Body mass index (BMI) 19 or less, adult: Secondary | ICD-10-CM

## 2017-07-12 DIAGNOSIS — Z23 Encounter for immunization: Secondary | ICD-10-CM | POA: Diagnosis not present

## 2017-07-12 DIAGNOSIS — I10 Essential (primary) hypertension: Secondary | ICD-10-CM | POA: Diagnosis not present

## 2017-07-12 DIAGNOSIS — R6889 Other general symptoms and signs: Secondary | ICD-10-CM

## 2017-07-12 DIAGNOSIS — E441 Mild protein-calorie malnutrition: Secondary | ICD-10-CM | POA: Diagnosis not present

## 2017-07-12 DIAGNOSIS — I6529 Occlusion and stenosis of unspecified carotid artery: Secondary | ICD-10-CM

## 2017-07-12 DIAGNOSIS — Z0000 Encounter for general adult medical examination without abnormal findings: Secondary | ICD-10-CM

## 2017-07-12 NOTE — Patient Instructions (Addendum)
Stop the calcium Can take magnesium as needed to help with constipation Makes sure you are drinking plenty of fluids  Benefiber or Citracel is good for constipation/diarrhea/irritable bowel syndrome, it helps with weight loss and can help lower your bad cholesterol. Please do 1 TBSP in the morning in water, coffee, or tea. It can take up to a month before you can see a difference with your bowel movements. It is cheapest from [employer], [employer], [employer].   About Constipation  Constipation Overview Constipation is the most common gastrointestinal complaint - about 4 million Americans experience constipation and make 2.5 million physician visits a year to get help for the problem.  Constipation can occur when the colon absorbs too much water, the colon's muscle contraction is slow or sluggish, and/or there is delayed transit time through the colon.  The result is stool that is hard and dry.  Indicators of constipation include straining during bowel movements greater than 25% of the time, having fewer than three bowel movements per week, and/or the feeling of incomplete evacuation.  There are established guidelines ([place] ) for defining constipation. A person needs to have two or more of the following symptoms for at least 12 weeks (not necessarily consecutive) in the preceding 12 months: . Straining in  greater than 25% of bowel movements . Lumpy or hard stools in greater than 25% of bowel movements . Sensation of incomplete emptying in greater than 25% of bowel movements . Sensation of anorectal obstruction/blockade in greater than 25% of bowel movements . Manual maneuvers to help empty greater than 25% of bowel movements (e.g., digital evacuation, support of the pelvic floor)  . Less than  3 bowel movements/week . Loose stools are not present, and criteria for irritable bowel syndrome are insufficient  Common Causes of Constipation . Lack of fiber in your diet . Lack of physical  activity . Medications, including iron and calcium supplements  . Dairy intake . Dehydration . Abuse of laxatives  Travel  Irritable Bowel Syndrome  Pregnancy  Luteal phase of menstruation (after ovulation and before menses)  Colorectal problems  Intestinal Dysfunction  Treating Constipation  There are several ways of treating constipation, including changes to diet and exercise, use of laxatives, adjustments to the pelvic floor, and scheduled toileting.  These treatments include: . increasing fiber and fluids in the diet  . increasing physical activity . learning muscle coordination   learning proper toileting techniques and toileting modifications   designing and sticking  to a toileting schedule     2007, [employer] Doc.22   Vitamin D goal is between 60-80  Please make sure that you are taking your Vitamin D as directed.   It is very important as a natural anti-inflammatory   helping hair, skin, and nails, as well as reducing stroke and heart attack risk.   It helps your bones and helps with mood.  We want you on at least 5000 IU daily  It also decreases numerous cancer risks so please take it as directed.   Low Vit D is associated with a 200-300% higher risk for CANCER   and 200-300% higher risk for HEART   ATTACK  &  STROKE.    .....................................Marland Kitchen  It is also associated with higher death rate at younger ages,   autoimmune diseases like Rheumatoid arthritis, Lupus, Multiple Sclerosis.     Also many other serious conditions, like depression, Alzheimer's  Dementia, infertility, muscle aches, fatigue, fibromyalgia - just to name a few.  +++++++++++++++++++  Can get liquid vitamin D from [place] here in [place] at  [facility] [address] [place], [place] [address] Or you can try earth fare

## 2017-12-01 ENCOUNTER — Encounter: Payer: Self-pay | Admitting: Adult Health

## 2017-12-01 ENCOUNTER — Ambulatory Visit (INDEPENDENT_AMBULATORY_CARE_PROVIDER_SITE_OTHER): Payer: PPO | Admitting: Adult Health

## 2017-12-01 VITALS — BP 122/58 | HR 86 | Temp 97.3°F | Ht 63.5 in | Wt 89.6 lb

## 2017-12-01 DIAGNOSIS — R310 Gross hematuria: Secondary | ICD-10-CM

## 2017-12-01 DIAGNOSIS — R1031 Right lower quadrant pain: Secondary | ICD-10-CM

## 2017-12-01 DIAGNOSIS — R5383 Other fatigue: Secondary | ICD-10-CM

## 2017-12-01 NOTE — Progress Notes (Signed)
Assessment and Plan:  Shelly Barrett was seen today for hematuria.  Diagnoses and all orders for this visit:  Gross hematuria Previously evaluated by Dr. Matilde Sprang, attempted in-office cystoscopy significantly limited by small introitus and urethra; patient did not tolerate procedure well. Requests alternate provider should we refer back. Discussed this will be likely, will proceed with labs and imaging prior to this to evaluate for obvious changes from baseline in 2016 with non-obstructing renal calculi. Discussed possible need for cystoscopy to r/o bladder lesion.  -     CBC with Differential/Platelet -     Urinalysis w microscopic + reflex cultur -     CT ABDOMEN PELVIS W WO CONTRAST; Future  Fatigue, unspecified type -     CBC with Differential/Platelet -     BASIC METABOLIC PANEL WITH GFR -     CT ABDOMEN PELVIS W WO CONTRAST; Future  RLQ abdominal pain -     CT ABDOMEN PELVIS W WO CONTRAST; Future  Further disposition pending results of labs. Discussed med's effects and SE's.   Over 30 minutes of exam, counseling, chart review, and critical decision making was performed.   Future Appointments  Date Time Provider [place]  01/20/2018  9:00 AM [name], MD [facility] None    ------------------------------------------------------------------------------------------------------------------   HPI BP (!) 122/58   Pulse 86   Temp (!) 97.3 F (36.3 C)   Ht 5' 3.5" (1.613 m)   Wt 89 lb 9.6 oz (40.6 kg)   SpO2 97%   BMI 15.62 kg/m   77 y.o.female presents for gross hematuria 3 days ago; she reports she started pushing fluids and stopped all supplements and medications. She reports bleeding improved with forced fluids but became worse again last night "toilet was full of bright blood."   She has hx of intermittent ongoing hematuria over the past several years; has been referred to urology and was evaluated by Dr. Matilde Sprang in fall of 2017 with attempts at in-office  cystoscopy; apparently exam was significantly limited by small introitus and urethra, significant pain and discomfort reported by patient and was not able to advance cystoscope. Bleeding has been attributed to renal calculi thus far, which are observed on CT from 09/19/2015 without obstruction.   She endorses ongoing fatigue as well. Denies fatigue, dizziness, chest pain, dyspnea.  She reports intermittent RLQ pain, "achiness" 4/10 - worst when sitting or lying down, causes significant discomfort at night and has been influencing her rest .  She has hx of tubal ligation. Recent colonoscopy by Dr. Carlean Purl 11/03/2016 reviewed with diminutive cecal polyp removal with multiple diverticuli - no further follow up recommended secondary to age.   She does not have any history of smoking or cancer.   BMI is Body mass index is 15.62 kg/m. Wt Readings from Last 3 Encounters:  12/01/17 89 lb 9.6 oz (40.6 kg)  07/12/17 86 lb 3.2 oz (39.1 kg)  12/31/16 91 lb (41.3 kg)    Past Medical History:  Diagnosis Date  . Arthritis   . Hyperlipidemia   . Prediabetes   . Scoliosis   . Sigmoid diverticulitis   . Vitamin D deficiency      Allergies  Allergen Reactions  . Betadine [Povidone Iodine]   . Ciprocinonide [Fluocinolone]   . Ciprofloxacin Hcl     Hives  . Flexeril [Cyclobenzaprine]   . Ibuprofen   . Naprosyn [Naproxen]   . Penicillins     REACTION: rash  . Shellfish Allergy   . Sodium Benzoate [Nutritional  Supplements]   . Iodinated Diagnostic Agents Rash    Current Outpatient Medications on File Prior to Visit  Medication Sig  . ALPRAZolam (XANAX XR) 0.5 MG 24 hr tablet Take 0.5 mg by mouth daily as needed.  . Biotin 1 MG CAPS Take by mouth.  Marland Kitchen CALCIUM PO Take 250 mg by mouth.  . cholecalciferol (VITAMIN D) 1000 UNITS tablet Take 6,000 Units by mouth daily.   No current facility-administered medications on file prior to visit.     ROS: Review of Systems  Constitutional: Positive  for malaise/fatigue. Negative for chills, fever and weight loss.  Eyes: Negative for blurred vision.  Respiratory: Negative for shortness of breath.   Cardiovascular: Negative for chest pain and palpitations.  Gastrointestinal: Positive for abdominal pain (RLQ intermittent, ongoing). Negative for nausea and vomiting.  Genitourinary: Positive for hematuria. Negative for dysuria, flank pain, frequency and urgency.  Musculoskeletal: Negative for back pain, falls and joint pain.  Neurological: Negative for dizziness, loss of consciousness, weakness and headaches.   Physical Exam:  BP (!) 122/58   Pulse 86   Temp (!) 97.3 F (36.3 C)   Ht 5' 3.5" (1.613 m)   Wt 89 lb 9.6 oz (40.6 kg)   SpO2 97%   BMI 15.62 kg/m   General Appearance: Frail elder, well dressed, in no acute distress. Eyes: PERRLA, EOMs, conjunctiva no swelling or erythema Mouth: MM pink/moist Neck: Supple.  Respiratory: Respiratory effort normal, BS equal bilaterally without rales, rhonchi, wheezing or stridor.  Cardio: RRR with no MRGs. Brisk peripheral pulses without edema.  Abdomen: Soft, + BS.  Tenderness to RLQ with deep palpation, no guarding, rebound, hernias, masses. Lymphatics: Non tender without lymphadenopathy.  Musculoskeletal: Back kyphotic/scoliotic, symmetrical strength, slow gait.  Skin: Warm, dry without rashes, lesions, ecchymosis.   Psych: Awake and oriented X 3, normal affect, Insight and Judgment appropriate.    Izora Ribas, NP 12:11 PM [facility]

## 2017-12-01 NOTE — Patient Instructions (Signed)

## 2017-12-02 LAB — URINALYSIS W MICROSCOPIC + REFLEX CULTURE
Bacteria, UA: NONE SEEN /HPF
Bilirubin Urine: NEGATIVE
Glucose, UA: NEGATIVE
HYALINE CAST: NONE SEEN /LPF
Ketones, ur: NEGATIVE
Leukocyte Esterase: NEGATIVE
Nitrites, Initial: NEGATIVE
SPECIFIC GRAVITY, URINE: 1.008 (ref 1.001–1.03)
Squamous Epithelial / LPF: NONE SEEN /HPF (ref <=5)
WBC, UA: NONE SEEN /HPF (ref 0–5)
pH: 5.5 (ref 5.0–8.0)

## 2017-12-02 LAB — CBC WITH DIFFERENTIAL/PLATELET
BASOS PCT: 0.3 %
Basophils Absolute: 9 cells/uL (ref 0–200)
EOS ABS: 40 {cells}/uL (ref 15–500)
Eosinophils Relative: 1.3 %
HCT: 37.9 % (ref 35.0–45.0)
Hemoglobin: 12.7 g/dL (ref 11.7–15.5)
Lymphs Abs: 1091 cells/uL (ref 850–3900)
MCH: 30.2 pg (ref 27.0–33.0)
MCHC: 33.5 g/dL (ref 32.0–36.0)
MCV: 90 fL (ref 80.0–100.0)
MONOS PCT: 8.1 %
MPV: 10.3 fL (ref 7.5–12.5)
Neutro Abs: 1708 cells/uL (ref 1500–7800)
Neutrophils Relative %: 55.1 %
PLATELETS: 277 10*3/uL (ref 140–400)
RBC: 4.21 10*6/uL (ref 3.80–5.10)
RDW: 12.5 % (ref 11.0–15.0)
TOTAL LYMPHOCYTE: 35.2 %
WBC mixed population: 251 cells/uL (ref 200–950)
WBC: 3.1 10*3/uL — ABNORMAL LOW (ref 3.8–10.8)

## 2017-12-02 LAB — BASIC METABOLIC PANEL WITH GFR
BUN / CREAT RATIO: 25 (calc) — AB (ref 6–22)
BUN: 13 mg/dL (ref 7–25)
CO2: 29 mmol/L (ref 20–32)
Calcium: 9.9 mg/dL (ref 8.6–10.4)
Chloride: 104 mmol/L (ref 98–110)
Creat: 0.51 mg/dL — ABNORMAL LOW (ref 0.60–0.93)
GFR, Est African American: 109 mL/min/{1.73_m2} (ref >=60)
GFR, Est Non African American: 94 mL/min/{1.73_m2} (ref >=60)
GLUCOSE: 103 mg/dL — AB (ref 65–99)
Potassium: 4.5 mmol/L (ref 3.5–5.3)
SODIUM: 140 mmol/L (ref 135–146)

## 2017-12-02 LAB — NO CULTURE INDICATED

## 2017-12-13 ENCOUNTER — Ambulatory Visit
Admission: RE | Admit: 2017-12-13 | Discharge: 2017-12-13 | Disposition: A | Discharge status: Home / Self Care | Payer: PPO | Source: Ambulatory Visit | Attending: Adult Health | Admitting: Adult Health

## 2017-12-13 DIAGNOSIS — R310 Gross hematuria: Secondary | ICD-10-CM

## 2017-12-13 DIAGNOSIS — R1031 Right lower quadrant pain: Secondary | ICD-10-CM

## 2017-12-13 DIAGNOSIS — R5383 Other fatigue: Secondary | ICD-10-CM

## 2017-12-13 DIAGNOSIS — N200 Calculus of kidney: Secondary | ICD-10-CM | POA: Diagnosis not present

## 2017-12-13 MED ORDER — IOPAMIDOL (ISOVUE-300) INJECTION 61%
125.0000 mL | Freq: Once | INTRAVENOUS | Status: AC | PRN
  Administered 2017-12-13: 125 mL via INTRAVENOUS

## 2017-12-14 ENCOUNTER — Encounter: Payer: Self-pay | Admitting: Adult Health

## 2017-12-14 ENCOUNTER — Other Ambulatory Visit: Payer: Self-pay | Admitting: Adult Health

## 2017-12-14 DIAGNOSIS — R310 Gross hematuria: Secondary | ICD-10-CM

## 2017-12-14 DIAGNOSIS — I700 Atherosclerosis of aorta: Secondary | ICD-10-CM | POA: Insufficient documentation

## 2017-12-14 DIAGNOSIS — K4190 Unilateral femoral hernia, without obstruction or gangrene, not specified as recurrent: Secondary | ICD-10-CM

## 2017-12-14 DIAGNOSIS — N200 Calculus of kidney: Secondary | ICD-10-CM

## 2017-12-14 HISTORY — DX: Unilateral femoral hernia, without obstruction or gangrene, not specified as recurrent: K41.90

## 2018-01-19 DIAGNOSIS — M419 Scoliosis, unspecified: Secondary | ICD-10-CM | POA: Insufficient documentation

## 2018-01-19 NOTE — Patient Instructions (Signed)

## 2018-01-19 NOTE — Progress Notes (Signed)
Patient ID: Shelly Barrett, female   DOB: 05-29-1941, 77 y.o.   MRN: [id number]  [facility] AD[facility] & ADOLESC[facility]NAL MEDICINE [name], M.D.     Uvaldo Bristle. Silverio Lay, P.A.-C Liane Comber, [place] [address] [place] [address] Telephone [phone] Telefax [phone]  Annual Screening/Preventative Visit & Comprehensive Evaluation &  Examination     This very nice 77 y.o.  MWF presents for a Screening/Preventative Visit & comprehensive evaluation and management of multiple medical co-morbidities.  Patient has been followed for labile  HTN, HLD, Prediabetes  and Vitamin D Deficiency. Other problems include DJD and DDD with  moderately severe thoracolumbar scoliosis. In 2002 she had EDSI for L4L5 HNP and routinely sees a chiropractor for "adjustments".     Patient has hx/o ongoing intermittent gross hematuria for several years since 2016 when she had initial evaluation by Dr Matilde Sprang wit a negative Urogram in 2016 and she was unable to anatomically tolerate Cystoscopy.  Recent Abd CT scan (March 2019) showed bilateral kidney stones,  a small Lt renal cyst (unchanged) and a small Lt.  femoral hernia.      Patient has had a 16# weight loss from a weight of 102# in 2016 to her current 86# (BMI 15.38 !). Patient's weight has been stable over the last year. Work-up has been negative for malignancy, malabsorption or thyroid Dz. Patient reports loss of appetite & early satiety and has had Negative EGD & Colon.  Wt Readings from Last 3 Encounters:  01/20/18 86 lb 12.8 oz (39.4 kg)  12/01/17 89 lb 9.6 oz (40.6 kg)  07/12/17 86 lb 3.2 oz (39.1 kg)       Patient has been monitored since 2002 for Labile HTN.  Patient's BP has been controlled  and patient denies any cardiac symptoms as chest pain, palpitations, shortness of breath, dizziness or ankle swelling. Today's BP is at goal -106/70.      Patient's hyperlipidemia is controlled with diet.  Last lipids were at goal: Lab Results  Component Value Date   CHOL 185 12/31/2016   HDL 102 12/31/2016   LDLCALC 71 12/31/2016   TRIG 58 12/31/2016   CHOLHDL 1.8 12/31/2016      Patient has prediabetes & Insulin resistance first discovered in 2012 when A1c 6.1% and 5.9% in 2014.  Patient denies reactive hypoglycemic symptoms, visual blurring, diabetic polys, or paresthesias. Since she's lost weight, her last A1c was Normal & at goal: Lab Results  Component Value Date   HGBA1C 5.3 10/06/2016      Finally, patient has history of Vitamin D Deficiency ("24"/2008)  and last Vitamin D was still low (goal is 70-100): Lab Results  Component Value Date   VD25OH 48 10/06/2016   Current Outpatient Medications on File Prior to Visit  Medication Sig  . ALPRAZolam (XANAX XR) 0.5 MG 24 hr tablet Take 0.5 mg by mouth daily as needed.  . Biotin 1 MG CAPS Take by mouth.  Marland Kitchen CALCIUM PO Take 250 mg by mouth.  . cholecalciferol (VITAMIN D) 1000 UNITS tablet Take 6,000 Units by mouth daily.   No current facility-administered medications on file prior to visit.    Allergies  Allergen Reactions  . Betadine [Povidone Iodine]   . Ciprocinonide [Fluocinolone]   . Ciprofloxacin Hcl     Hives  . Flexeril [Cyclobenzaprine]   . Ibuprofen   . Naprosyn [Naproxen]   . Penicillins     REACTION: rash  . Shellfish Allergy   .  Sodium Benzoate [Nutritional Supplements]   . Iodinated Diagnostic Agents Rash   Past Medical History:  Diagnosis Date  . Arthritis   . Hyperlipidemia   . Prediabetes   . Scoliosis   . Sigmoid diverticulitis   . Vitamin D deficiency    Health Maintenance  Topic Date Due  . INFLUENZA VACCINE  05/11/2018  . TETANUS/TDAP  11/17/2020  . COLONOSCOPY  11/03/2026  . DEXA SCAN  Completed  . PNA vac Low Risk Adult  Completed   Immunization History  Administered Date(s) Administered  . Influenza, High Dose Seasonal PF 07/30/2014, 06/25/2015, 07/01/2016, 07/12/2017  .  Influenza-Unspecified 06/20/2013  . Pneumococcal Conjugate-13 09/18/2014  . Pneumococcal-Unspecified 09/16/2009  . Tdap 11/17/2010  . Zoster 09/16/2009   Last Colon - 11/03/2016 - Dr Carlean Purl - no f/u recommended  Last MGM - 10/01/2015 - Declines further MGM's.  Past Surgical History:  Procedure Laterality Date  . [place], 2012   implants  . COLONOSCOPY    . TONSILLECTOMY AND ADENOIDECTOMY    . TUBAL LIGATION     Family History  Problem Relation Age of Onset  . Heart disease Mother   . Hypertension Mother   . Heart disease Father   . Cancer Father        throat and prostate  . Arthritis Sister        RA  . Hyperlipidemia Brother   . Colon cancer Neg Hx   . Stomach cancer Neg Hx   . Rectal cancer Neg Hx   . Esophageal cancer Neg Hx   . Alcohol abuse Neg Hx   . Liver cancer Neg Hx    Social History   Tobacco Use  . Smoking status: Never Smoker  . Smokeless tobacco: Never Used  Substance Use Topics  . Alcohol use: No  . Drug use: No    ROS Constitutional: Denies fever, chills, weight loss/gain, headaches, insomnia,  night sweats, and change in appetite. Does c/o fatigue. Eyes: Denies redness, blurred vision, diplopia, discharge, itchy, watery eyes.  ENT: Denies discharge, congestion, post nasal drip, epistaxis, sore throat, earache, hearing loss, dental pain, Tinnitus, Vertigo, Sinus pain, snoring.  Cardio: Denies chest pain, palpitations, irregular heartbeat, syncope, dyspnea, diaphoresis, orthopnea, PND, claudication, edema Respiratory: denies cough, dyspnea, DOE, pleurisy, hoarseness, laryngitis, wheezing.  Gastrointestinal: Denies dysphagia, heartburn, reflux, water brash, pain, cramps, nausea, vomiting, bloating, diarrhea, constipation, hematemesis, melena, hematochezia, jaundice, hemorrhoids Genitourinary: Denies dysuria, frequency, urgency, nocturia, hesitancy, discharge, hematuria, flank pain Breast:  Denies mmmmmmmmmmmmmmmmmmmmmmm Breast lumps,  nipple discharge, bleeding.  Musculoskeletal: Denies arthralgia, myalgia, stiffness, Jt. Swelling, pain, limp, and strain/sprain. Denies falls. Skin: Denies puritis, rash, hives, warts, acne, eczema, changing in skin lesion Neuro: No weakness, tremor, incoordination, spasms, paresthesia, pain Psychiatric: Denies confusion, memory loss, sensory loss. Denies Depression. Endocrine: Denies change in weight, skin, hair change, nocturia, and paresthesia, diabetic polys, visual blurring, hyper / hypo glycemic episodes.  Heme/Lymph: No excessive bleeding, bruising, enlarged lymph nodes.  Physical Exam  BP 106/70   Pulse 80   Temp 98.1 F (36.7 C)   Resp 16   Ht 5\' 3"  (1.6 m)   Wt 86 lb 12.8 oz (39.4 kg)   BMI 15.38 kg/m   General Appearance: Very slight stature appearing under nourished. Well groomed and in no apparent distress.  Eyes: PERRLA, EOMs, conjunctiva no swelling or erythema, normal fundi and vessels. Sinuses: No frontal/maxillary tenderness ENT/Mouth: EACs patent / TMs  nl. Nares clear without erythema, swelling, mucoid exudates. Oral hygiene  is good. No erythema, swelling, or exudate. Tongue normal, non-obstructing. Tonsils not swollen or erythematous. Hearing normal.  Neck: Supple, thyroid not palpable. No bruits, nodes or JVD. Respiratory: Respiratory effort normal.  BS equal and clear bilateral without rales, rhonci, wheezing or stridor. Cardio: Heart sounds are normal with regular rate and rhythm and no murmurs, rubs or gallops. Peripheral pulses are normal and equal bilaterally without edema. No aortic or femoral bruits. Chest: assymmetric with S- thoracolumbar sciolosiand percussion. Breasts: Symmetric, without Palpable mobile implants w/o lumps, nipple discharge, retractions, or fibrocystic changes.  Abdomen: Flat, soft with bowel sounds active. Nontender, no guarding, rebound, hernias, masses, or organomegaly.  Lymphatics: Non tender without lymphadenopathy.   Musculoskeletal: Full ROM all peripheral extremities, joint stability, 5/5 strength, and normal gait. Skin: Warm and dry without rashes, lesions, cyanosis, clubbing or  ecchymosis.  Neuro: Cranial nerves intact, reflexes equal bilaterally. Normal muscle tone, no cerebellar symptoms. Sensation intact.  Pysch: Alert and oriented X 3, normal affect, Insight and Judgment appropriate.   Assessment and Plan  1. Annual Preventative Screening Examination  2. Labile hypertension  - EKG 12-Lead - Urinalysis, Routine w reflex microscopic - Urine Culture - Microalbumin / creatinine urine ratio - CBC with Differential/Platelet - BASIC METABOLIC PANEL WITH GFR - Magnesium - TSH  3. Hyperlipidemia, mixed  - EKG 12-Lead - Hepatic function panel - Lipid panel - TSH  4. Abnormal glucose  - EKG 12-Lead - Hemoglobin A1c - Insulin, random  5. Vitamin D deficiency  - VITAMIN D 25 Hydroxyl  6. Prediabetes  - EKG 12-Lead - Hemoglobin A1c - Insulin, random  7. Hematuria, gross  - Urinalysis, Routine w reflex microscopic - Urine Culture  8. Scoliosis of thoracolumbar spine   9. Aortic atherosclerosis (HCC)  - EKG 12-Lead  10. Body mass index (BMI) less than 16.5   11. Loss of weight  12. History of hematuria   13. Screening for ischemic heart disease  - EKG 12-Lead  14. Medication management  - Urinalysis, Routine w reflex microscopic - Urine Culture - CBC with Differential/Platelet           Patient was counseled in prudent diet to achieve/maintain BMI less than 25 for weight control, BP monitoring, regular exercise and medications. Discussed med's effects and SE's. Screening labs and tests as requested with regular follow-up as recommended. Over 40 minutes of exam, counseling, chart review and high complex critical decision making was performed.

## 2018-01-20 ENCOUNTER — Encounter: Payer: Self-pay | Admitting: Internal Medicine

## 2018-01-20 ENCOUNTER — Ambulatory Visit (INDEPENDENT_AMBULATORY_CARE_PROVIDER_SITE_OTHER): Payer: PPO | Admitting: Internal Medicine

## 2018-01-20 VITALS — BP 106/70 | HR 80 | Temp 98.1°F | Resp 16 | Ht 63.0 in | Wt 86.8 lb

## 2018-01-20 DIAGNOSIS — R0989 Other specified symptoms and signs involving the circulatory and respiratory systems: Secondary | ICD-10-CM | POA: Diagnosis not present

## 2018-01-20 DIAGNOSIS — Z136 Encounter for screening for cardiovascular disorders: Secondary | ICD-10-CM

## 2018-01-20 DIAGNOSIS — E782 Mixed hyperlipidemia: Secondary | ICD-10-CM | POA: Diagnosis not present

## 2018-01-20 DIAGNOSIS — R7303 Prediabetes: Secondary | ICD-10-CM

## 2018-01-20 DIAGNOSIS — R310 Gross hematuria: Secondary | ICD-10-CM

## 2018-01-20 DIAGNOSIS — R7309 Other abnormal glucose: Secondary | ICD-10-CM | POA: Diagnosis not present

## 2018-01-20 DIAGNOSIS — R634 Abnormal weight loss: Secondary | ICD-10-CM

## 2018-01-20 DIAGNOSIS — Z87448 Personal history of other diseases of urinary system: Secondary | ICD-10-CM

## 2018-01-20 DIAGNOSIS — Z0001 Encounter for general adult medical examination with abnormal findings: Secondary | ICD-10-CM

## 2018-01-20 DIAGNOSIS — Z79899 Other long term (current) drug therapy: Secondary | ICD-10-CM | POA: Diagnosis not present

## 2018-01-20 DIAGNOSIS — E559 Vitamin D deficiency, unspecified: Secondary | ICD-10-CM

## 2018-01-20 DIAGNOSIS — Z681 Body mass index (BMI) 19 or less, adult: Secondary | ICD-10-CM

## 2018-01-20 DIAGNOSIS — I10 Essential (primary) hypertension: Secondary | ICD-10-CM

## 2018-01-20 DIAGNOSIS — M419 Scoliosis, unspecified: Secondary | ICD-10-CM

## 2018-01-20 DIAGNOSIS — I700 Atherosclerosis of aorta: Secondary | ICD-10-CM

## 2018-01-20 DIAGNOSIS — Z0000 Encounter for general adult medical examination without abnormal findings: Secondary | ICD-10-CM

## 2018-01-21 LAB — URINALYSIS, ROUTINE W REFLEX MICROSCOPIC
BILIRUBIN URINE: NEGATIVE
Bacteria, UA: NONE SEEN /HPF
GLUCOSE, UA: NEGATIVE
Hyaline Cast: NONE SEEN /LPF
Ketones, ur: NEGATIVE
NITRITE: NEGATIVE
Protein, ur: NEGATIVE
SPECIFIC GRAVITY, URINE: 1.015 (ref 1.001–1.03)
Squamous Epithelial / LPF: NONE SEEN /HPF (ref <=5)

## 2018-01-21 LAB — URINE CULTURE
MICRO NUMBER: 90453509
RESULT: NO GROWTH
SPECIMEN QUALITY:: ADEQUATE

## 2018-01-21 LAB — MICROALBUMIN / CREATININE URINE RATIO
CREATININE, URINE: 88 mg/dL (ref 20–275)
MICROALB UR: 12.2 mg/dL
Microalb Creat Ratio: 139 mcg/mg creat — ABNORMAL HIGH (ref <30)

## 2018-01-23 LAB — BASIC METABOLIC PANEL WITH GFR
BUN/Creatinine Ratio: 27 (calc) — ABNORMAL HIGH (ref 6–22)
BUN: 15 mg/dL (ref 7–25)
CHLORIDE: 103 mmol/L (ref 98–110)
CO2: 31 mmol/L (ref 20–32)
Calcium: 10.2 mg/dL (ref 8.6–10.4)
Creat: 0.55 mg/dL — ABNORMAL LOW (ref 0.60–0.93)
GFR, EST AFRICAN AMERICAN: 106 mL/min/{1.73_m2} (ref >=60)
GFR, EST NON AFRICAN AMERICAN: 92 mL/min/{1.73_m2} (ref >=60)
Glucose, Bld: 91 mg/dL (ref 65–99)
POTASSIUM: 3.7 mmol/L (ref 3.5–5.3)
SODIUM: 142 mmol/L (ref 135–146)

## 2018-01-23 LAB — CBC WITH DIFFERENTIAL/PLATELET
BASOS ABS: 10 {cells}/uL (ref 0–200)
Basophils Relative: 0.3 %
EOS PCT: 1.5 %
Eosinophils Absolute: 51 cells/uL (ref 15–500)
HEMATOCRIT: 38.1 % (ref 35.0–45.0)
Hemoglobin: 12.8 g/dL (ref 11.7–15.5)
LYMPHS ABS: 1017 {cells}/uL (ref 850–3900)
MCH: 30 pg (ref 27.0–33.0)
MCHC: 33.6 g/dL (ref 32.0–36.0)
MCV: 89.4 fL (ref 80.0–100.0)
MPV: 10.4 fL (ref 7.5–12.5)
Monocytes Relative: 8.7 %
NEUTROS PCT: 59.6 %
Neutro Abs: 2026 cells/uL (ref 1500–7800)
PLATELETS: 245 10*3/uL (ref 140–400)
RBC: 4.26 10*6/uL (ref 3.80–5.10)
RDW: 12.8 % (ref 11.0–15.0)
TOTAL LYMPHOCYTE: 29.9 %
WBC mixed population: 296 cells/uL (ref 200–950)
WBC: 3.4 10*3/uL — AB (ref 3.8–10.8)

## 2018-01-23 LAB — LIPID PANEL
Cholesterol: 217 mg/dL — ABNORMAL HIGH (ref <200)
HDL: 99 mg/dL (ref >50)
LDL Cholesterol (Calc): 105 mg/dL (calc) — ABNORMAL HIGH
NON-HDL CHOLESTEROL (CALC): 118 mg/dL (ref <130)
Total CHOL/HDL Ratio: 2.2 (calc) (ref <5.0)
Triglycerides: 52 mg/dL (ref <150)

## 2018-01-23 LAB — HEPATIC FUNCTION PANEL
AG Ratio: 2 (calc) (ref 1.0–2.5)
ALT: 13 U/L (ref 6–29)
AST: 24 U/L (ref 10–35)
Albumin: 4.9 g/dL (ref 3.6–5.1)
Alkaline phosphatase (APISO): 117 U/L (ref 33–130)
BILIRUBIN DIRECT: 0.1 mg/dL (ref 0.0–0.2)
Globulin: 2.5 g/dL (calc) (ref 1.9–3.7)
Indirect Bilirubin: 0.4 mg/dL (calc) (ref 0.2–1.2)
Total Bilirubin: 0.5 mg/dL (ref 0.2–1.2)
Total Protein: 7.4 g/dL (ref 6.1–8.1)

## 2018-01-23 LAB — TSH: TSH: 0.73 m[IU]/L (ref 0.40–4.50)

## 2018-01-23 LAB — INSULIN, RANDOM: Insulin: 3.1 u[IU]/mL (ref 2.0–19.6)

## 2018-01-23 LAB — VITAMIN D 25 HYDROXY (VIT D DEFICIENCY, FRACTURES): Vit D, 25-Hydroxy: 54 ng/mL (ref 30–100)

## 2018-01-23 LAB — HEMOGLOBIN A1C
EAG (MMOL/L): 6.2 (calc)
HEMOGLOBIN A1C: 5.5 %{Hb} (ref <5.7)
Mean Plasma Glucose: 111 (calc)

## 2018-01-23 LAB — MAGNESIUM: Magnesium: 2.3 mg/dL (ref 1.5–2.5)

## 2018-05-22 ENCOUNTER — Encounter: Payer: Self-pay | Admitting: Adult Health

## 2018-05-22 ENCOUNTER — Ambulatory Visit (INDEPENDENT_AMBULATORY_CARE_PROVIDER_SITE_OTHER): Payer: PPO | Admitting: Adult Health

## 2018-05-22 VITALS — BP 106/58 | HR 80 | Temp 97.7°F | Resp 16 | Ht 63.0 in | Wt 84.4 lb

## 2018-05-22 DIAGNOSIS — N3000 Acute cystitis without hematuria: Secondary | ICD-10-CM | POA: Diagnosis not present

## 2018-05-22 DIAGNOSIS — R310 Gross hematuria: Secondary | ICD-10-CM

## 2018-05-22 DIAGNOSIS — R8290 Unspecified abnormal findings in urine: Secondary | ICD-10-CM | POA: Diagnosis not present

## 2018-05-22 DIAGNOSIS — N281 Cyst of kidney, acquired: Secondary | ICD-10-CM | POA: Diagnosis not present

## 2018-05-22 DIAGNOSIS — N200 Calculus of kidney: Secondary | ICD-10-CM

## 2018-05-22 DIAGNOSIS — R0981 Nasal congestion: Secondary | ICD-10-CM

## 2018-05-22 DIAGNOSIS — R300 Dysuria: Secondary | ICD-10-CM | POA: Diagnosis not present

## 2018-05-22 MED ORDER — SULFAMETHOXAZOLE-TRIMETHOPRIM 800-160 MG PO TABS
1.0000 | ORAL_TABLET | Freq: Two times a day (BID) | ORAL | 0 refills | Status: DC
Start: 2018-05-22 — End: 2018-10-19

## 2018-05-22 MED ORDER — PREDNISONE 20 MG PO TABS: ORAL_TABLET | ORAL | 0 refills | Status: DC

## 2018-05-22 NOTE — Progress Notes (Signed)
Assessment and Plan:  Shelly Barrett was seen today for urinary tract infection, bites and sinusitis.  Diagnoses and all orders for this visit:  Gross hematuria Recurrent, ? R/t renal calculi or known left renal cyst; discussed at length today need to undergo evaluation by urology, possible need for cysto under anesthesia. Patient finally agreeable to follow up with urologist for discussion only -     Urinalysis, Routine w reflex microscopic -     Urine Culture -     Ambulatory referral to Urology  Cloudy urine -     Urinalysis, Routine w reflex microscopic -     Urine Culture -     sulfamethoxazole-trimethoprim (BACTRIM DS,SEPTRA DS) 800-160 MG tablet; Take 1 tablet by mouth 2 (two) times daily.  Renal stones -     Ambulatory referral to Urology  Mild nasal congestion -     predniSONE (DELTASONE) 20 MG tablet; 2 tablets daily for 3 days, 1 tablet daily for 4 days.  Renal cyst, left -     Ambulatory referral to Urology  Further disposition pending results of labs. Discussed med's effects and SE's.   Over 30 minutes of exam, counseling, chart review, and critical decision making was performed.   Future Appointments  Date Time Provider [place]  07/24/2018 11:15 AM Liane Comber, NP [facility] None  02/01/2019  9:00 AM [name], MD [facility] None    ------------------------------------------------------------------------------------------------------------------   HPI 77 y.o.female presents for evaluation of ongoing cloudy urine and gross hematuria last night and this AM. She has hx of recurrent gross hematuria, CT abd/pelvis was obtained for this 12/13/2017 which demonstrated left renal cyst and bilateral renal calculi. She has seen Dr. Matilde Sprang in the past with attempted in office ?cysto which patient tolerated very poorly and has been very resistant to referral back for evaluation.   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 She also reports some nasal congestion (thick, green) for past 3-4 days which has been bothersome.   BMI is Body mass index is 14.95 kg/m. She continues to lose weight; she is poorly informed regarding proper diet, ? Seems to believe hematuria associated with food intake and has been avoiding/cutting out foods that she suspected was contributing. We discussed food intake is not suspected as contributing to hematuria, and reviewed at length necessity for protein and fat intake in particular, adequate caloric intake. Patient has taken copious notes.  Wt Readings from Last 3 Encounters:  05/22/18 84 lb 6.4 oz (38.3 kg)  01/20/18 86 lb 12.8 oz (39.4 kg)  12/01/17 89 lb 9.6 oz (40.6 kg)    Past Medical History:  Diagnosis Date  . Arthritis   . Hyperlipidemia   . Prediabetes   . Scoliosis   . Sigmoid diverticulitis   . Vitamin D deficiency      Allergies  Allergen Reactions  . Betadine [Povidone Iodine]   . Ciprocinonide [Fluocinolone]   . Ciprofloxacin Hcl     Hives  . Flexeril [Cyclobenzaprine]   . Ibuprofen   . Naprosyn [Naproxen]   . Penicillins     REACTION: rash  . Shellfish Allergy   . Sodium Benzoate [Nutritional Supplements]   . Iodinated Diagnostic Agents Rash    Current Outpatient Medications on File Prior to Visit  Medication Sig  . ALPRAZolam (XANAX XR) 0.5 MG 24 hr tablet  Take 0.5 mg by mouth daily as needed.  . Biotin 1 MG CAPS Take by mouth.  Marland Kitchen CALCIUM PO Take 250 mg by mouth.  . cholecalciferol (VITAMIN D) 1000 UNITS tablet Take 6,000 Units by mouth daily.   No current facility-administered medications on file prior to visit.     ROS: Review of Systems  Constitutional: Positive for malaise/fatigue and weight loss. Negative for chills and fever.  HENT: Negative.   Eyes: Negative.   Respiratory: Negative.   Cardiovascular: Negative.    Gastrointestinal: Positive for constipation. Negative for abdominal pain, blood in stool, diarrhea, heartburn, melena, nausea and vomiting.  Genitourinary: Positive for hematuria. Negative for dysuria, flank pain, frequency and urgency.  Musculoskeletal: Negative.   Skin: Positive for itching (bug bites). Negative for rash.  Neurological: Negative for dizziness and headaches.  Psychiatric/Behavioral: Negative for depression, hallucinations, memory loss and substance abuse. The patient is not nervous/anxious.     Physical Exam:  BP (!) 106/58   Pulse 80   Temp 97.7 F (36.5 C)   Resp 16   Ht 5\' 3"  (1.6 m)   Wt 84 lb 6.4 oz (38.3 kg)   BMI 14.95 kg/m   General Appearance: Frail elder, in no apparent distress. Eyes: PERRLA, EOMs, conjunctiva no swelling or erythema Sinuses: No Frontal/maxillary tenderness ENT/Mouth: Ext aud canals clear, TMs without erythema, bulging. No erythema, swelling, or exudate on post pharynx.  Tonsils not swollen or erythematous. Hearing normal.  Neck: Supple, thyroid normal.  Respiratory: Respiratory effort normal, BS equal bilaterally without rales, rhonchi, wheezing or stridor.  Cardio: RRR with no MRGs. Brisk peripheral pulses without edema.  Abdomen: Soft, + BS.  Non tender, no guarding, rebound, hernias, masses. Lymphatics: Non tender without lymphadenopathy.  Musculoskeletal: Full ROM, 3/5 strength, symmetrical, normal gait.  Skin: Warm, dry without rashes, ecchymosis; she has multiple distinct insect bits with scant surrounding erythema to left inner thigh.  Neuro: Cranial nerves intact. Normal muscle tone, no cerebellar symptoms. Sensation intact.  Psych: Awake and oriented X 3, normal affect, Insight and Judgment poor.    Izora Ribas, NP 5:17 PM [facility]oles[facility]

## 2018-05-22 NOTE — Progress Notes (Deleted)
   Subjective:    Patient ID: Shelly Barrett, female    DOB: May 24, 1941, 77 y.o.   MRN: [id number]  HPI    Review of Systems     Objective:   Physical Exam        Assessment & Plan:

## 2018-05-22 NOTE — Patient Instructions (Addendum)
Urinary Tract Infection, Adult A urinary tract infection (UTI) is an infection of any part of the urinary tract, which includes the kidneys, ureters, bladder, and urethra. These organs make, store, and get rid of urine in the body. UTI can be a bladder infection (cystitis) or kidney infection (pyelonephritis). What are the causes? This infection may be caused by fungi, viruses, or bacteria. Bacteria are the most common cause of UTIs. This condition can also be caused by repeated incomplete emptying of the bladder during urination. What increases the risk? This condition is more likely to develop if:  You ignore your need to urinate or hold urine for long periods of time.  You do not empty your bladder completely during urination.  You wipe back to front after urinating or having a bowel movement, if you are female.  You are constipated.  You have a urinary catheter that stays in place (indwelling).  You have a weak defense (immune) system.  You have a medical condition that affects your bowels, kidneys, or bladder.  You have diabetes.  You take antibiotic medicines frequently or for long periods of time, and the antibiotics no longer work well against certain types of infections (antibiotic resistance).  You take medicines that irritate your urinary tract.  You are exposed to chemicals that irritate your urinary tract.  You are female.  What are the signs or symptoms? Symptoms of this condition include:  Fever.  Frequent urination or passing small amounts of urine frequently.  Needing to urinate urgently.  Pain or burning with urination.  Urine that smells bad or unusual.  Cloudy urine.  Pain in the lower abdomen or back.  Trouble urinating.  Blood in the urine.  Vomiting or being less hungry than normal.  Diarrhea or abdominal pain.  Vaginal discharge, if you are female.  How is this diagnosed? This condition is diagnosed with a medical history and  physical exam. You will also need to provide a urine sample to test your urine. Other tests may be done, including:  Blood tests.  Sexually transmitted disease (STD) testing.  If you have had more than one UTI, a cystoscopy or imaging studies may be done to determine the cause of the infections. How is this treated? Treatment for this condition often includes a combination of two or more of the following:  Antibiotic medicine.  Other medicines to treat less common causes of UTI.  Over-the-counter medicines to treat pain.  Drinking enough water to stay hydrated.  Follow these instructions at home:  Take over-the-counter and prescription medicines only as told by your health care provider.  If you were prescribed an antibiotic, take it as told by your health care provider. Do not stop taking the antibiotic even if you start to feel better.  Avoid alcohol, caffeine, tea, and carbonated beverages. They can irritate your bladder.  Drink enough fluid to keep your urine clear or pale yellow.  Keep all follow-up visits as told by your health care provider. This is important.  Make sure to: ? Empty your bladder often and completely. Do not hold urine for long periods of time. ? Empty your bladder before and after sex. ? Wipe from front to back after a bowel movement if you are female. Use each tissue one time when you wipe. Contact a health care provider if:  You have back pain.  You have a fever.  You feel nauseous or vomit.  Your symptoms do not get better after 3 days.  Your symptoms go away and then return. Get help right away if:  You have severe back pain or lower abdominal pain.  You are vomiting and cannot keep down any medicines or water.    Sulfamethoxazole; Trimethoprim, SMX-TMP tablets What is this medicine? SULFAMETHOXAZOLE; TRIMETHOPRIM or SMX-TMP (suhl fuh meth OK suh zohl; trye METH oh prim) is a combination of a sulfonamide antibiotic and a second  antibiotic, trimethoprim. It is used to treat or prevent certain kinds of bacterial infections. It will not work for colds, flu, or other viral infections. This medicine may be used for other purposes; ask your health care provider or pharmacist if you have questions. COMMON BRAND NAME(S): Bacter-Aid DS, Bactrim, Bactrim DS, Septra, Septra DS What should I tell my health care provider before I take this medicine? They need to know if you have any of these conditions: -anemia -asthma -being treated with anticonvulsants -if you frequently drink alcohol containing drinks -kidney disease -liver disease -low level of folic acid or [id number] dehydrogenase -poor nutrition or malabsorption -porphyria -severe allergies -thyroid disorder -an unusual or allergic reaction to sulfamethoxazole, trimethoprim, sulfa drugs, other medicines, foods, dyes, or preservatives -pregnant or trying to get pregnant -breast-feeding How should I use this medicine? Take this medicine by mouth with a full glass of water. Follow the directions on the prescription label. Take your medicine at regular intervals. Do not take it more often than directed. Do not skip doses or stop your medicine early. Talk to your pediatrician regarding the use of this medicine in children. Special care may be needed. This medicine has been used in children as young as 34 months of age. Overdosage: If you think you have taken too much of this medicine contact a poison control center or emergency room at once. NOTE: This medicine is only for you. Do not share this medicine with others. What if I miss a dose? If you miss a dose, take it as soon as you can. If it is almost time for your next dose, take only that dose. Do not take double or extra doses. What may interact with this medicine? Do not take this medicine with any of the following medications: -aminobenzoate potassium -dofetilide -metronidazole This medicine may also  interact with the following medications: -ACE inhibitors like benazepril, enalapril, lisinopril, and ramipril -birth control pills -cyclosporine -digoxin -diuretics -indomethacin -medicines for diabetes -methenamine -methotrexate -phenytoin -potassium supplements -pyrimethamine -sulfinpyrazone -tricyclic antidepressants -warfarin This list may not describe all possible interactions. Give your health care provider a list of all the medicines, herbs, non-prescription drugs, or dietary supplements you use. Also tell them if you smoke, drink alcohol, or use illegal drugs. Some items may interact with your medicine. What should I watch for while using this medicine? Tell your doctor or health care professional if your symptoms do not improve. Drink several glasses of water a day to reduce the risk of kidney problems. Do not treat diarrhea with over the counter products. Contact your doctor if you have diarrhea that lasts more than 2 days or if it is severe and watery. This medicine can make you more sensitive to the sun. Keep out of the sun. If you cannot avoid being in the sun, wear protective clothing and use a sunscreen. Do not use sun lamps or tanning beds/booths. What side effects may I notice from receiving this medicine? Side effects that you should report to your doctor or health care professional as soon as possible: -allergic reactions like skin rash or hives,  swelling of the face, lips, or tongue -breathing problems -fever or chills, sore throat -irregular heartbeat, chest pain -joint or muscle pain -pain or difficulty passing urine -red pinpoint spots on skin -redness, blistering, peeling or loosening of the skin, including inside the mouth -unusual bleeding or bruising -unusually weak or tired -yellowing of the eyes or skin Side effects that usually do not require medical attention (report to your doctor or health care professional if they continue or are  bothersome): -diarrhea -dizziness -headache -loss of appetite -nausea, vomiting -nervousness This list may not describe all possible side effects. Call your doctor for medical advice about side effects. You may report side effects to FDA at 1-800-FDA-1088. Where should I keep my medicine? Keep out of the reach of children. Store at room temperature between 20 to 25 degrees C (68 to 77 degrees F). Protect from light. Throw away any unused medicine after the expiration date. NOTE: This sheet is a summary. It may not cover all possible information. If you have questions about this medicine, talk to your doctor, pharmacist, or health care provider.  2018 Elsevier/Gold Standard (2013-05-04 14:38:26)

## 2018-05-24 LAB — URINE CULTURE
MICRO NUMBER: 90953944
SPECIMEN QUALITY:: ADEQUATE

## 2018-05-24 LAB — URINALYSIS, ROUTINE W REFLEX MICROSCOPIC
BILIRUBIN URINE: NEGATIVE
Glucose, UA: NEGATIVE
Ketones, ur: NEGATIVE
NITRITE: POSITIVE — AB
SPECIFIC GRAVITY, URINE: 1.016 (ref 1.001–1.03)
SQUAMOUS EPITHELIAL / LPF: NONE SEEN /HPF (ref <=5)
WBC, UA: >=60 /HPF — AB (ref 0–5)
pH: <=5 (ref 5.0–8.0)

## 2018-06-15 ENCOUNTER — Encounter: Payer: Self-pay | Admitting: Internal Medicine

## 2018-07-05 ENCOUNTER — Ambulatory Visit (INDEPENDENT_AMBULATORY_CARE_PROVIDER_SITE_OTHER): Payer: PPO

## 2018-07-05 DIAGNOSIS — Z23 Encounter for immunization: Secondary | ICD-10-CM

## 2018-07-24 ENCOUNTER — Ambulatory Visit: Payer: Self-pay | Admitting: Adult Health

## 2018-10-18 ENCOUNTER — Encounter: Payer: Self-pay | Admitting: Adult Health

## 2018-10-18 NOTE — Progress Notes (Signed)
MEDICARE ANNUAL WELLNESS VISIT AND FOLLOW UP  Assessment:    Medicare annual wellness visit, subsequent -due next year  Carotid stenosis, unspecified laterality - diagnosed over 20 years ago, she is declining further imaging as she would decline any surgical recommendations.  - no ASA due to bleeding issues -cholesterol at goal  Atherosclerosis of aorta Control blood pressure, cholesterol, glucose, increase exercise.    Essential hypertension -Dash diet -cont to monitor at home -exercise as tolerated  Hyperlipidemia -at goal -cont diet and exercise as tolerated  Other abnormal glucose -cont diet and exercise as tolerated   Vitamin D deficiency -cont supplement -cont calcium with it   Medication management -cont biannual lab testing   Fibrocystic breast changes, bilateral -Declines mammograms, continue breast exams  Gross hematuria CT unremarkable; declines further workup No further episodes after stopping carbonated beverages Monitor - check UA today  Over 30 minutes of exam, counseling, chart review, and critical decision making was performed Future Appointments  Date Time Provider [place]  02/01/2019  9:00 AM [name], MD [facility] None     Plan:   During the course of the visit the patient was educated and counseled about appropriate screening and preventive services including:    Pneumococcal vaccine   Influenza vaccine  Td vaccine  Prevnar 13  Screening electrocardiogram  Screening mammography  Bone densitometry screening  Colorectal cancer screening  Diabetes screening  Glaucoma screening  Nutrition counseling   Advanced directives: given info/requested copies    Subjective:   Shelly Barrett is a 78 y.o. female who presents for Medicare Annual Wellness Visit and 3 month follow up on hypertension, prediabetes, hyperlipidemia, vitamin D def.   She is prescribed xanax 0.5 mg, she reports she takes rarely, just  2-3 times a year for severe anxiety or sleeping issues or when back pain is severe.   Hematuria; she has had several episodes of gross hematuria, CT abd showed bilateral nephrolithiasis without obstruction, left renal cyst thought to represent minimaly complex cyst, stable since 09/19/2015. She reports today that she has not had any further episodes of hematuria since our last visit. She is avoiding carbonated beverages and feels this is helping. She was referred to urology for further evaluation and management at one point while she was still having episodes, but patient is very resistant and hasn't followed through and absolutely refuses this today. She also reports she will decline any further imaging requiring contrast. She has had a poor experience in the past with attempted cysto in office that didn't go well.   She has chronic back pain due to scoliosis but has declined pain management, does ok with lifestyle modification, pillows/cushioning and stretches.   BMI is Body mass index is 15.09 kg/m., she has been working on diet, discussed high fat/protein options at length, she is trying to add more nut butters, avocado, etc. Options limited due to food sensitivities.  Wt Readings from Last 3 Encounters:  10/19/18 85 lb 3.2 oz (38.6 kg)  05/22/18 84 lb 6.4 oz (38.3 kg)  01/20/18 86 lb 12.8 oz (39.4 kg)   Her blood pressure has been controlled at home, today their BP is BP: 122/70 She does workout. She denies chest pain, shortness of breath, dizziness.   She is not on cholesterol medication and denies myalgias. Her cholesterol is at goal. The cholesterol last visit was:   Lab Results  Component Value Date   CHOL 217 (H) 01/20/2018   HDL 99 01/20/2018   LDLCALC 105 (  H) 01/20/2018   TRIG 52 01/20/2018   CHOLHDL 2.2 01/20/2018   She has been working on diet and exercise for glucose management.  No evidence of polydipsia, polyphagia, or polyuria.   Lab Results  Component Value Date   HGBA1C  5.5 01/20/2018   Last GFR Lab Results  Component Value Date   GFRNONAA 92 01/20/2018   Patient is on Vitamin D supplement. Lab Results  Component Value Date   VD25OH 54 01/20/2018        Medication Review Current Outpatient Medications on File Prior to Visit  Medication Sig Dispense Refill  . ALPRAZolam (XANAX XR) 0.5 MG 24 hr tablet Take 0.5 mg by mouth daily as needed.    Marland Kitchen CALCIUM PO Take 250 mg by mouth.    . cholecalciferol (VITAMIN D) 1000 UNITS tablet Take 6,000 Units by mouth daily.    . TURMERIC PO Take by mouth daily.     No current facility-administered medications on file prior to visit.     Current Problems (verified) Patient Active Problem List   Diagnosis Date Noted  . Scoliosis of thoracolumbar spine 01/19/2018  . Aortic atherosclerosis ([place]) 12/14/2017  . Malnutrition of mild degree ([place]) 06/23/2017  . Body mass index (BMI) less than 16.5 12/31/2016  . Hematuria, gross 07/27/2016  . Fibrocystic breast changes, bilateral 08/28/2015  . Medication management 11/25/2013  . Hypertension   . Hyperlipidemia   . Other abnormal glucose   . Vitamin D deficiency   . Carotid stenosis 05/28/2010    Screening Tests Immunization History  Administered Date(s) Administered  . Influenza, High Dose Seasonal PF 07/30/2014, 06/25/2015, 07/01/2016, 07/12/2017, 07/05/2018  . Influenza-Unspecified 06/20/2013  . Pneumococcal Conjugate-13 09/18/2014  . Pneumococcal-Unspecified 09/16/2009  . Tdap 11/17/2010  . Zoster 09/16/2009    Preventative care: Last colonoscopy: 2009 declines another Last mammogram: 09/2015 declines due to pain, does self breast exams Last pap smear/pelvic exam: remote   DEXA: 12/2014 declines another Ct AB 12/2017  MRI lumbar 2002  DOESN'T WANT IMAGING AS WOULD NOT PURSUE TREATMENT  Prior vaccinations: TD or Tdap: 2012  Influenza 2019 Pneumococcal: 2010 Prevnar13: 2015 Shingles/Zostavax: 2010  Names of Other Physician/Practitioners you  currently use: 1. [facility]- here for primary care 2. Dr. Delman Cheadle, eye doctor, last visit 2019, goes q80m, wears glasses 3. Dr. Mariea Clonts, dentist, last visit 2019, goes q15m  Patient Care Team: [name], MD as PCP - General (Internal Medicine) Gatha Mayer, MD as Consulting Physician (Gastroenterology)  Allergies Allergies  Allergen Reactions  . Betadine [Povidone Iodine]   . Ciprocinonide [Fluocinolone]   . Ciprofloxacin Hcl     Hives  . Flexeril [Cyclobenzaprine]   . Ibuprofen   . Naprosyn [Naproxen]   . Penicillins     REACTION: rash  . Shellfish Allergy   . Sodium Benzoate [Nutritional Supplements]   . Iodinated Diagnostic Agents Rash    SURGICAL HISTORY She  has a past surgical history that includes Breast surgery (1980, 2012); Tubal ligation; Tonsillectomy and adenoidectomy; and Colonoscopy. FAMILY HISTORY Her family history includes Arthritis in her sister; Cancer in her father; Heart disease in her father and mother; Hyperlipidemia in her brother; Hypertension in her mother. SOCIAL HISTORY She  reports that she has never smoked. She has never used smokeless tobacco. She reports that she does not drink alcohol or use drugs.  MEDICARE WELLNESS OBJECTIVES: Tobacco use: She does not smoke.  Patient is not a former smoker. If yes, counseling given Alcohol  Current alcohol use: none Diet: well balanced Physical activity: Current Exercise Habits: Home exercise routine, Type of exercise: strength training/weights;stretching;walking, Time (Minutes): 10, Frequency (Times/Week): 6, Weekly Exercise (Minutes/Week): 60, Intensity: Mild, Exercise limited by: orthopedic condition(s) Cardiac risk factors: Cardiac Risk Factors include: dyslipidemia;hypertension;advanced age (>67men, >64 women);sedentary lifestyle Depression/mood screen:   Depression screen [facility] 2/9 10/19/2018  Decreased Interest 0  Down, Depressed, Hopeless 0  PHQ - 2 Score 0     ADLs:  In your present state of health, do you have any difficulty performing the following activities: 10/19/2018 01/20/2018  Hearing? N N  Comment persistent tinnitis -  Vision? N N  Difficulty concentrating or making decisions? Y N  Comment forgetting people's names, will generally recall 15-20 min later -  Walking or climbing stairs? N N  Dressing or bathing? N N  Doing errands, shopping? N N  Some recent data might be hidden     Cognitive Testing  Alert? Yes  Normal Appearance?Yes  Oriented to person? Yes  Place? Yes   Time? Yes  Recall of three objects?  Yes  Can perform simple calculations? Yes  Displays appropriate judgment?Yes  Can read the correct time from a watch face?Yes  EOL planning: Does Patient Have a Medical Advance Directive?: Yes Type of Advance Directive: Healthcare Power of Attorney, Living will Does patient want to make changes to medical advance directive?: No - Patient declined Copy of [place] in Chart?: No - copy requested   Objective:   Today's Vitals   10/19/18 1119  BP: 122/70  Pulse: 78  Temp: (!) 97.3 F (36.3 C)  SpO2: 99%  Weight: 85 lb 3.2 oz (38.6 kg)  Height: 5\' 3"  (1.6 m)  PainSc: 1   PainLoc: Back   Body mass index is 15.09 kg/m.  General appearance: alert, no distress, very thin/frail WD/WN,  female HEENT: normocephalic, sclerae anicteric, TMs pearly, nares patent, no discharge or erythema, pharynx normal Oral cavity: MMM, no lesions Neck: supple, no lymphadenopathy, no thyromegaly, no masses Heart: RRR, normal S1, S2, no murmurs Lungs: CTA bilaterally, no wheezes, rhonchi, or rales Abdomen: +bs, soft, non tender, non distended, no masses, no hepatomegaly, no splenomegaly Musculoskeletal: nontender, no swelling, she has moderate kyphoscoliosis Extremities: no edema, no cyanosis, no clubbing Pulses: 2+ symmetric, upper and lower extremities, normal cap refill Neurological: alert, oriented x 3, CN2-12  intact, strength normal upper extremities and lower extremities, sensation normal throughout, DTRs 2+ throughout, no cerebellar signs, gait normal Psychiatric: normal affect, behavior normal, pleasant  Breast: defer Gyn: defer Rectal: defer   Medicare Attestation I have personally reviewed: The patient's medical and social history Their use of alcohol, tobacco or illicit drugs Their current medications and supplements The patient's functional ability including ADLs,fall risks, home safety risks, cognitive, and hearing and visual impairment Diet and physical activities Evidence for depression or mood disorders  The patient's weight, height, BMI, and visual acuity have been recorded in the chart.  I have made referrals, counseling, and provided education to the patient based on review of the above and I have provided the patient with a written personalized care plan for preventive services.     Izora Ribas, NP   10/19/2018

## 2018-10-19 ENCOUNTER — Ambulatory Visit (INDEPENDENT_AMBULATORY_CARE_PROVIDER_SITE_OTHER): Payer: PPO | Admitting: Adult Health

## 2018-10-19 ENCOUNTER — Encounter: Payer: Self-pay | Admitting: Adult Health

## 2018-10-19 VITALS — BP 122/70 | HR 78 | Temp 97.3°F | Ht 63.0 in | Wt 85.2 lb

## 2018-10-19 DIAGNOSIS — R310 Gross hematuria: Secondary | ICD-10-CM

## 2018-10-19 DIAGNOSIS — E559 Vitamin D deficiency, unspecified: Secondary | ICD-10-CM

## 2018-10-19 DIAGNOSIS — R6889 Other general symptoms and signs: Secondary | ICD-10-CM

## 2018-10-19 DIAGNOSIS — E785 Hyperlipidemia, unspecified: Secondary | ICD-10-CM

## 2018-10-19 DIAGNOSIS — I700 Atherosclerosis of aorta: Secondary | ICD-10-CM

## 2018-10-19 DIAGNOSIS — I10 Essential (primary) hypertension: Secondary | ICD-10-CM

## 2018-10-19 DIAGNOSIS — E441 Mild protein-calorie malnutrition: Secondary | ICD-10-CM

## 2018-10-19 DIAGNOSIS — Z79899 Other long term (current) drug therapy: Secondary | ICD-10-CM | POA: Diagnosis not present

## 2018-10-19 DIAGNOSIS — Z681 Body mass index (BMI) 19 or less, adult: Secondary | ICD-10-CM

## 2018-10-19 DIAGNOSIS — I6529 Occlusion and stenosis of unspecified carotid artery: Secondary | ICD-10-CM

## 2018-10-19 DIAGNOSIS — R7309 Other abnormal glucose: Secondary | ICD-10-CM | POA: Diagnosis not present

## 2018-10-19 DIAGNOSIS — N6012 Diffuse cystic mastopathy of left breast: Secondary | ICD-10-CM

## 2018-10-19 DIAGNOSIS — M419 Scoliosis, unspecified: Secondary | ICD-10-CM | POA: Diagnosis not present

## 2018-10-19 DIAGNOSIS — Z0001 Encounter for general adult medical examination with abnormal findings: Secondary | ICD-10-CM

## 2018-10-19 DIAGNOSIS — N6011 Diffuse cystic mastopathy of right breast: Secondary | ICD-10-CM

## 2018-10-19 DIAGNOSIS — Z0000 Encounter for general adult medical examination without abnormal findings: Secondary | ICD-10-CM

## 2018-10-19 NOTE — Patient Instructions (Addendum)
Shelly Barrett , Thank you for taking time to come for your Medicare Wellness Visit. I appreciate your ongoing commitment to your health goals. Please review the following plan we discussed and let me know if I can assist you in the future.   These are the goals we discussed: Goals    . Gain weight       This is a list of the screening recommended for you and due dates:  Health Maintenance  Topic Date Due  . Tetanus Vaccine  11/17/2020  . Flu Shot  Completed  . DEXA scan (bone density measurement)  Completed  . Pneumonia vaccines  Completed      High-Protein and High-Calorie Diet Eating high-protein and high-calorie foods can help you to gain weight, heal after an injury, and recover after an illness or surgery. The specific amount of daily protein and calories you need depends on:  Your body weight.  The reason this diet is recommended for you. What is my plan? Generally, a high-protein, high-calorie diet involves:  Eating 250-500 extra calories each day.  Making sure that you get enough of your daily calories from protein. Ask your health care provider how many of your calories should come from protein. Talk with a health care provider, such as a diet and nutrition specialist (dietitian), about how much protein and how many calories you need each day. Follow the diet as directed by your health care provider. What are tips for following this plan?  Preparing meals  Add whole milk, half-and-half, or heavy cream to cereal, pudding, soup, or hot cocoa.  Add whole milk to instant breakfast drinks.  Add peanut butter to oatmeal or smoothies.  Add powdered milk to baked goods, smoothies, or milkshakes.  Add powdered milk, cream, or butter to mashed potatoes.  Add cheese to cooked vegetables.  Make whole-milk yogurt parfaits. Top them with granola, fruit, or nuts.  Add cottage cheese to your fruit.  Add avocado, cheese, or both to sandwiches or salads.  Add meat,  poultry, or seafood to rice, pasta, casseroles, salads, and soups.  Use mayonnaise when making egg salad, chicken salad, or tuna salad.  Use peanut butter as a dip for vegetables or as a topping for pretzels, celery, or crackers.  Add beans to casseroles, dips, and spreads.  Add pureed beans to sauces and soups.  Replace calorie-free drinks with calorie-containing drinks, such as milk and fruit juice.  Replace water with milk or heavy cream when making foods such as oatmeal, pudding, or cocoa. General instructions  Ask your health care provider if you should take a nutritional supplement.  Try to eat six small meals each day instead of three large meals.  Eat a balanced diet. In each meal, include one food that is high in protein.  Keep nutritious snacks available, such as nuts, trail mixes, dried fruit, and yogurt.  If you have kidney disease or diabetes, talk with your health care provider about how much protein is safe for you. Too much protein may put extra stress on your kidneys.  Drink your calories. Choose high-calorie drinks and have them after your meals. What high-protein foods should I eat?  Vegetables Soybeans. Peas. Grains Quinoa. Bulgur wheat. Meats and other proteins Beef, pork, and poultry. Fish and seafood. Eggs. Tofu. Textured vegetable protein (TVP). Peanut butter. Nuts and seeds. Dried beans. Protein powders. Dairy Whole milk. Whole-milk yogurt. Powdered milk. Cheese. [employer]. Eggnog. Beverages High-protein supplement drinks. Soy milk. Other foods Protein bars. The items  listed above may not be a complete list of high-protein foods and beverages. Contact a dietitian for more options. What high-calorie foods should I eat? Fruits Dried fruit. Fruit leather. Canned fruit in syrup. Fruit juice. Avocado. Vegetables Vegetables cooked in oil or butter. Fried potatoes. Grains Pasta. Quick breads. Muffins. Pancakes. Ready-to-eat cereal. Meats and  other proteins Peanut butter. Nuts and seeds. Dairy Heavy cream. Whipped cream. Cream cheese. Sour cream. Ice cream. Custard. Pudding. Beverages Meal-replacement beverages. Nutrition shakes. Fruit juice. Sugar-sweetened soft drinks. Seasonings and condiments Salad dressing. Mayonnaise. Alfredo sauce. Fruit preserves or jelly. Honey. Syrup. Sweets and desserts Cake. Cookies. Pie. Pastries. Candy bars. Chocolate. Fats and oils Butter or margarine. Oil. Gravy. Other foods Meal-replacement bars. The items listed above may not be a complete list of high-calorie foods and beverages. Contact a dietitian for more options. Summary  A high-protein, high-calorie diet can help you gain weight or heal faster after an injury, illness, or surgery.  To increase your protein and calories, add ingredients such as whole milk, peanut butter, cheese, beans, meat, or seafood to meal items.  To get enough extra calories each day, include high-calorie foods and beverages at each meal.  Adding a high-calorie drink or shake can be an easy way to help you get enough calories each day. Talk with your healthcare provider or dietitian about the best options for you. This information is not intended to replace advice given to you by your health care provider. Make sure you discuss any questions you have with your health care provider. Document Released: 09/27/2005 Document Revised: 08/09/2017 Document Reviewed: 08/09/2017 Elsevier Interactive Patient Education  2019 [employer].

## 2018-10-20 LAB — COMPLETE METABOLIC PANEL WITH GFR
AG RATIO: 1.8 (calc) (ref 1.0–2.5)
ALBUMIN MSPROF: 4.6 g/dL (ref 3.6–5.1)
ALKALINE PHOSPHATASE (APISO): 115 U/L (ref 33–130)
ALT: 12 U/L (ref 6–29)
AST: 21 U/L (ref 10–35)
BUN/Creatinine Ratio: 38 (calc) — ABNORMAL HIGH (ref 6–22)
BUN: 21 mg/dL (ref 7–25)
CO2: 26 mmol/L (ref 20–32)
CREATININE: 0.56 mg/dL — AB (ref 0.60–0.93)
Calcium: 9.4 mg/dL (ref 8.6–10.4)
Chloride: 103 mmol/L (ref 98–110)
GFR, EST NON AFRICAN AMERICAN: 91 mL/min/{1.73_m2} (ref >=60)
GFR, Est African American: 105 mL/min/{1.73_m2} (ref >=60)
GLOBULIN: 2.6 g/dL (ref 1.9–3.7)
Glucose, Bld: 96 mg/dL (ref 65–99)
POTASSIUM: 3.3 mmol/L — AB (ref 3.5–5.3)
SODIUM: 140 mmol/L (ref 135–146)
Total Bilirubin: 0.4 mg/dL (ref 0.2–1.2)
Total Protein: 7.2 g/dL (ref 6.1–8.1)

## 2018-10-20 LAB — CBC WITH DIFFERENTIAL/PLATELET
Absolute Monocytes: 273 cells/uL (ref 200–950)
BASOS ABS: 19 {cells}/uL (ref 0–200)
Basophils Relative: 0.6 %
EOS ABS: 99 {cells}/uL (ref 15–500)
Eosinophils Relative: 3.2 %
HEMATOCRIT: 38.9 % (ref 35.0–45.0)
Hemoglobin: 13.2 g/dL (ref 11.7–15.5)
LYMPHS ABS: 1147 {cells}/uL (ref 850–3900)
MCH: 31.1 pg (ref 27.0–33.0)
MCHC: 33.9 g/dL (ref 32.0–36.0)
MCV: 91.7 fL (ref 80.0–100.0)
MPV: 10.7 fL (ref 7.5–12.5)
Monocytes Relative: 8.8 %
NEUTROS ABS: 1562 {cells}/uL (ref 1500–7800)
Neutrophils Relative %: 50.4 %
Platelets: 260 10*3/uL (ref 140–400)
RBC: 4.24 10*6/uL (ref 3.80–5.10)
RDW: 12.4 % (ref 11.0–15.0)
Total Lymphocyte: 37 %
WBC: 3.1 10*3/uL — ABNORMAL LOW (ref 3.8–10.8)

## 2018-10-20 LAB — LIPID PANEL
CHOL/HDL RATIO: 2.3 (calc) (ref <5.0)
Cholesterol: 211 mg/dL — ABNORMAL HIGH (ref <200)
HDL: 90 mg/dL (ref >50)
LDL Cholesterol (Calc): 106 mg/dL (calc) — ABNORMAL HIGH
NON-HDL CHOLESTEROL (CALC): 121 mg/dL (ref <130)
Triglycerides: 61 mg/dL (ref <150)

## 2018-10-20 LAB — URINALYSIS, ROUTINE W REFLEX MICROSCOPIC
Bilirubin Urine: NEGATIVE
GLUCOSE, UA: NEGATIVE
Hyaline Cast: NONE SEEN /LPF
Ketones, ur: NEGATIVE
Nitrite: POSITIVE — AB
Specific Gravity, Urine: 1.019 (ref 1.001–1.03)
Squamous Epithelial / HPF: NONE SEEN /HPF (ref <=5)
WBC, UA: >=60 /HPF — AB (ref 0–5)
pH: <=5 (ref 5.0–8.0)

## 2018-10-20 LAB — TSH: TSH: 1.1 mIU/L (ref 0.40–4.50)

## 2018-12-14 ENCOUNTER — Encounter: Payer: Self-pay | Admitting: Internal Medicine

## 2019-02-01 ENCOUNTER — Encounter: Payer: Self-pay | Admitting: Internal Medicine

## 2019-04-19 ENCOUNTER — Ambulatory Visit (INDEPENDENT_AMBULATORY_CARE_PROVIDER_SITE_OTHER): Payer: PPO | Admitting: Internal Medicine

## 2019-04-19 ENCOUNTER — Encounter: Payer: Self-pay | Admitting: Internal Medicine

## 2019-04-19 ENCOUNTER — Other Ambulatory Visit: Payer: Self-pay

## 2019-04-19 VITALS — BP 114/70 | HR 84 | Temp 97.3°F | Resp 16 | Ht 63.0 in | Wt 83.4 lb

## 2019-04-19 DIAGNOSIS — R0989 Other specified symptoms and signs involving the circulatory and respiratory systems: Secondary | ICD-10-CM | POA: Diagnosis not present

## 2019-04-19 DIAGNOSIS — E782 Mixed hyperlipidemia: Secondary | ICD-10-CM | POA: Diagnosis not present

## 2019-04-19 DIAGNOSIS — Z1212 Encounter for screening for malignant neoplasm of rectum: Secondary | ICD-10-CM

## 2019-04-19 DIAGNOSIS — Z8249 Family history of ischemic heart disease and other diseases of the circulatory system: Secondary | ICD-10-CM

## 2019-04-19 DIAGNOSIS — Z0000 Encounter for general adult medical examination without abnormal findings: Secondary | ICD-10-CM

## 2019-04-19 DIAGNOSIS — Z79899 Other long term (current) drug therapy: Secondary | ICD-10-CM

## 2019-04-19 DIAGNOSIS — Z0001 Encounter for general adult medical examination with abnormal findings: Secondary | ICD-10-CM

## 2019-04-19 DIAGNOSIS — Z136 Encounter for screening for cardiovascular disorders: Secondary | ICD-10-CM | POA: Diagnosis not present

## 2019-04-19 DIAGNOSIS — Z1211 Encounter for screening for malignant neoplasm of colon: Secondary | ICD-10-CM

## 2019-04-19 DIAGNOSIS — R7309 Other abnormal glucose: Secondary | ICD-10-CM

## 2019-04-19 DIAGNOSIS — I10 Essential (primary) hypertension: Secondary | ICD-10-CM | POA: Diagnosis not present

## 2019-04-19 DIAGNOSIS — E559 Vitamin D deficiency, unspecified: Secondary | ICD-10-CM

## 2019-04-19 DIAGNOSIS — I700 Atherosclerosis of aorta: Secondary | ICD-10-CM

## 2019-04-19 DIAGNOSIS — Z681 Body mass index (BMI) 19 or less, adult: Secondary | ICD-10-CM

## 2019-04-19 NOTE — Progress Notes (Signed)
[facility] [name], M.D.     Uvaldo Bristle. Silverio Lay, P.A.-C Liane Comber, [place] [address] [place] [address] Telephone [phone] Telefax [phone] Annual Screening/Preventative Visit & Comprehensive Evaluation &  Examination     This very nice 78 y.o. MWF presents for a Screening /Preventative Visit & comprehensive evaluation and management of multiple medical co-morbidities.  Patient has been followed for labile HTN, HLD, Prediabetes  and Vitamin D Deficiency. Patient also has hx/o DJD/DDD and moderately severe thoracolumbar scoliosis. In 2002 , she had an EDSI for L4/L5 HNP. She does have regular "adjustments" with a Chiropractor.      Patient had moderate weight loss of 16# from 102# in 2016 to 86 # in 2019 and she has lost 3# moe to 86# since last year. She seems cavalier & indifferent wrt her weight loss. Previous w/u has been negative for malignancy, malabsorption and thyroid disease.        Labile HTN predates since 2002. Patient's BP has been controlled at home and patient denies any cardiac symptoms as chest pain, palpitations, shortness of breath, dizziness or ankle swelling. Today's BP is at goal - 114/70      Patient's hyperlipidemia is controlled with diet and medications. Patient denies myalgias or other medication SE's. Last lipids were not at goal: Lab Results  Component Value Date   CHOL 211 (H) 10/19/2018   HDL 90 10/19/2018   LDLCALC 106 (H) 10/19/2018   TRIG 61 10/19/2018   CHOLHDL 2.3 10/19/2018      Patient has hx/o prediabetes / Insulin Resistance since 2012 and in 2014 she had A1c 6.1% and 5.9%.  Her A1c have normalized since her weight loss. Patient denies reactive hypoglycemic symptoms, visual blurring, diabetic polys or paresthesias. Last A1c was  Lab Results  Component Value Date   HGBA1C 5.5 01/20/2018      Finally, patient has history of Vitamin D  Deficiency ("24" / 2008) and last Vitamin D was not at goal (70-100): Lab Results  Component Value Date   VD25OH 54 01/20/2018   Current Outpatient Medications on File Prior to Visit  Medication Sig  . ALPRAZolam (XANAX XR) 0.5 MG 24 hr tablet Take 0.5 mg by mouth daily as needed.  Marland Kitchen CALCIUM PO Take 250 mg by mouth.  . cholecalciferol (VITAMIN D) 1000 UNITS tablet Take 6,000 Units by mouth daily.  . TURMERIC PO Take by mouth daily.   No current facility-administered medications on file prior to visit.    Allergies  Allergen Reactions  . Betadine [Povidone Iodine]   . Ciprocinonide [Fluocinolone]   . Ciprofloxacin Hcl     Hives  . Flexeril [Cyclobenzaprine]   . Ibuprofen   . Naprosyn [Naproxen]   . Penicillins     REACTION: rash  . Shellfish Allergy   . Sodium Benzoate [Nutritional Supplements]   . Iodinated Diagnostic Agents Rash   Past Medical History:  Diagnosis Date  . Arthritis   . Hyperlipidemia   . Left femoral hernia without obstruction or gangrene 12/14/2017   On CT 12/13/2017  . Prediabetes   . Scoliosis   . Sigmoid diverticulitis   . Vitamin D deficiency    Health Maintenance  Topic Date Due  . INFLUENZA VACCINE  05/12/2019  . TETANUS/TDAP  11/17/2020  . DEXA SCAN  Completed  . PNA vac Low Risk Adult  Completed   Immunization History  Administered Date(s) Administered  . Influenza, High  Dose Seasonal PF 07/30/2014, 06/25/2015, 07/01/2016, 07/12/2017, 07/05/2018  . Influenza-Unspecified 06/20/2013  . Pneumococcal Conjugate-13 09/18/2014  . Pneumococcal-Unspecified 09/16/2009  . Tdap 11/17/2010  . Zoster 09/16/2009   Last Colon & EGD - 11/03/2016 - Dr Carlean Purl - no f/u recommended  Last MGM & Breast MRI  - 10/01/2015 - Declines further MGM's.  Past Surgical History:  Procedure Laterality Date  . [place], 2012   implants  . COLONOSCOPY    . TONSILLECTOMY AND ADENOIDECTOMY    . TUBAL LIGATION     Family History  Problem Relation Age  of Onset  . Heart disease Mother   . Hypertension Mother   . Heart disease Father   . Cancer Father        throat and prostate  . Arthritis Sister        RA  . Hyperlipidemia Brother   . Colon cancer Neg Hx   . Stomach cancer Neg Hx   . Rectal cancer Neg Hx   . Esophageal cancer Neg Hx   . Alcohol abuse Neg Hx   . Liver cancer Neg Hx    Social History   Tobacco Use  . Smoking status: Never Smoker  . Smokeless tobacco: Never Used  Substance Use Topics  . Alcohol use: No  . Drug use: No    ROS Constitutional: Denies fever, chills, weight loss/gain, headaches, insomnia,  night sweats, and change in appetite. Does c/o fatigue. Eyes: Denies redness, blurred vision, diplopia, discharge, itchy, watery eyes.  ENT: Denies discharge, congestion, post nasal drip, epistaxis, sore throat, earache, hearing loss, dental pain, Tinnitus, Vertigo, Sinus pain, snoring.  Cardio: Denies chest pain, palpitations, irregular heartbeat, syncope, dyspnea, diaphoresis, orthopnea, PND, claudication, edema Respiratory: denies cough, dyspnea, DOE, pleurisy, hoarseness, laryngitis, wheezing.  Gastrointestinal: Denies dysphagia, heartburn, reflux, water brash, pain, cramps, nausea, vomiting, bloating, diarrhea, constipation, hematemesis, melena, hematochezia, jaundice, hemorrhoids Genitourinary: Denies dysuria, frequency, urgency, nocturia, hesitancy, discharge, hematuria, flank pain Breast: Breast lumps, nipple discharge, bleeding.  Musculoskeletal: Denies arthralgia, myalgia, stiffness, Jt. Swelling, pain, limp, and strain/sprain. Denies falls. Skin: Denies puritis, rash, hives, warts, acne, eczema, changing in skin lesion Neuro: No weakness, tremor, incoordination, spasms, paresthesia, pain Psychiatric: Denies confusion, memory loss, sensory loss. Denies Depression. Endocrine: Denies change in weight, skin, hair change, nocturia, and paresthesia, diabetic polys, visual blurring, hyper / hypo glycemic  episodes.  Heme/Lymph: No excessive bleeding, bruising, enlarged lymph nodes.  Physical Exam  BP 114/70   Pulse 84   Temp (!) 97.3 F (36.3 C)   Resp 16   Ht 5\' 3"  (1.6 m)   Wt 83 lb 6.4 oz (37.8 kg)   BMI 14.77 kg/m   General Appearance: Very thin, well groomed and in no apparent distress.  Eyes: PERRLA, EOMs, conjunctiva no swelling or erythema, normal fundi and vessels. Sinuses: No frontal/maxillary tenderness ENT/Mouth: EACs patent / TMs  nl. Nares clear without erythema, swelling, mucoid exudates. Oral hygiene is good. No erythema, swelling, or exudate. Tongue normal, non-obstructing. Tonsils not swollen or erythematous. Hearing normal.  Neck: Supple, thyroid not palpable. No bruits, nodes or JVD. Respiratory: Respiratory effort normal.  BS equal and clear bilateral without rales, rhonci, wheezing or stridor. Cardio: Heart sounds are normal with regular rate and rhythm and no murmurs, rubs or gallops. Peripheral pulses are normal and equal bilaterally without edema. No aortic or femoral bruits. Chest: symmetric with normal excursions and percussion. Breasts: Symmetric, without lumps, nipple discharge, retractions, or fibrocystic changes.  Abdomen: Flat, soft with  bowel sounds active. Nontender, no guarding, rebound, hernias, masses, or organomegaly.  Lymphatics: Non tender without lymphadenopathy.  Musculoskeletal: Full ROM all peripheral extremities, joint stability, 5/5 strength, and normal gait. Skin: Warm and dry without rashes, lesions, cyanosis, clubbing or  ecchymosis.  Neuro: Cranial nerves intact, reflexes equal bilaterally. Normal muscle tone, no cerebellar symptoms. Sensation intact.  Pysch: Alert and oriented X 3, normal affect, Insight and Judgment appropriate.   Assessment and Plan  1. Annual Preventative Screening Examination  2. Labile hypertension  - EKG 12-Lead - Urinalysis, Routine w reflex microscopic - Microalbumin / creatinine urine ratio - CBC with  Differential/Platelet - COMPLETE METABOLIC PANEL WITH GFR - Magnesium - TSH  3. Hyperlipidemia, mixed  - EKG 12-Lead - TSH - Lipid panel  4. Abnormal glucose  - EKG 12-Lead - Hemoglobin A1c - Insulin, random  5. Vitamin D deficiency  - VITAMIN D 25 Hydroxyl  6. Body mass index (BMI) less than 16.5   7. Screening for ischemic heart disease  - EKG 12-Lead  8. FHx: heart disease  - EKG 12-Lead  9. Aortic atherosclerosis (HCC)  - EKG 12-Lead  10. Medication management  - CBC with Differential/Platelet - COMPLETE METABOLIC PANEL WITH GFR - Magnesium - TSH - Lipid panel - Hemoglobin A1c - Insulin, random - VITAMIN D 25 Hydroxyl  11. Screening for colorectal cancer  - POC Hemoccult Bld/Stl        Patient was counseled in prudent diet to achieve/maintain BMI less than 25 for weight control, BP monitoring, regular exercise and medications. Discussed med's effects and SE's. Screening labs and tests as requested with regular follow-up as recommended. Over 40 minutes of exam, counseling, chart review and high complex critical decision making was performed.   Kirtland Bouchard, MD

## 2019-04-19 NOTE — Patient Instructions (Signed)

## 2019-04-20 ENCOUNTER — Other Ambulatory Visit: Payer: Self-pay | Admitting: Internal Medicine

## 2019-04-20 DIAGNOSIS — N3000 Acute cystitis without hematuria: Secondary | ICD-10-CM

## 2019-04-20 LAB — COMPLETE METABOLIC PANEL WITH GFR
AG Ratio: 1.9 (calc) (ref 1.0–2.5)
ALT: 13 U/L (ref 6–29)
AST: 21 U/L (ref 10–35)
Albumin: 4.5 g/dL (ref 3.6–5.1)
Alkaline phosphatase (APISO): 106 U/L (ref 37–153)
BUN: 20 mg/dL (ref 7–25)
CO2: 25 mmol/L (ref 20–32)
Calcium: 9.6 mg/dL (ref 8.6–10.4)
Chloride: 105 mmol/L (ref 98–110)
Creat: 0.63 mg/dL (ref 0.60–0.93)
GFR, Est African American: 100 mL/min/{1.73_m2} (ref >=60)
GFR, Est Non African American: 87 mL/min/{1.73_m2} (ref >=60)
Globulin: 2.4 g/dL (calc) (ref 1.9–3.7)
Glucose, Bld: 150 mg/dL — ABNORMAL HIGH (ref 65–99)
Potassium: 3.8 mmol/L (ref 3.5–5.3)
Sodium: 140 mmol/L (ref 135–146)
Total Bilirubin: 0.5 mg/dL (ref 0.2–1.2)
Total Protein: 6.9 g/dL (ref 6.1–8.1)

## 2019-04-20 LAB — MICROALBUMIN / CREATININE URINE RATIO
Creatinine, Urine: 93 mg/dL (ref 20–275)
Microalb Creat Ratio: 416 mcg/mg creat — ABNORMAL HIGH (ref <30)
Microalb, Ur: 38.7 mg/dL

## 2019-04-20 LAB — LIPID PANEL
Cholesterol: 196 mg/dL (ref <200)
HDL: 81 mg/dL (ref >=50)
LDL Cholesterol (Calc): 102 mg/dL (calc) — ABNORMAL HIGH
Non-HDL Cholesterol (Calc): 115 mg/dL (calc) (ref <130)
Total CHOL/HDL Ratio: 2.4 (calc) (ref <5.0)
Triglycerides: 51 mg/dL (ref <150)

## 2019-04-20 LAB — CBC WITH DIFFERENTIAL/PLATELET
Absolute Monocytes: 287 cells/uL (ref 200–950)
Basophils Absolute: 20 cells/uL (ref 0–200)
Basophils Relative: 0.6 %
Eosinophils Absolute: 40 cells/uL (ref 15–500)
Eosinophils Relative: 1.2 %
HCT: 38.1 % (ref 35.0–45.0)
Hemoglobin: 12.6 g/dL (ref 11.7–15.5)
Lymphs Abs: 1106 cells/uL (ref 850–3900)
MCH: 30.4 pg (ref 27.0–33.0)
MCHC: 33.1 g/dL (ref 32.0–36.0)
MCV: 92 fL (ref 80.0–100.0)
MPV: 10.8 fL (ref 7.5–12.5)
Monocytes Relative: 8.7 %
Neutro Abs: 1848 cells/uL (ref 1500–7800)
Neutrophils Relative %: 56 %
Platelets: 238 10*3/uL (ref 140–400)
RBC: 4.14 10*6/uL (ref 3.80–5.10)
RDW: 13 % (ref 11.0–15.0)
Total Lymphocyte: 33.5 %
WBC: 3.3 10*3/uL — ABNORMAL LOW (ref 3.8–10.8)

## 2019-04-20 LAB — URINALYSIS, ROUTINE W REFLEX MICROSCOPIC
Bilirubin Urine: NEGATIVE
Glucose, UA: NEGATIVE
Hyaline Cast: NONE SEEN /LPF
Ketones, ur: NEGATIVE
Nitrite: POSITIVE — AB
Specific Gravity, Urine: 1.019 (ref 1.001–1.03)
Squamous Epithelial / HPF: NONE SEEN /HPF (ref <=5)
pH: <=5 (ref 5.0–8.0)

## 2019-04-20 LAB — INSULIN, RANDOM: Insulin: 14.9 u[IU]/mL

## 2019-04-20 LAB — TSH: TSH: 1.05 mIU/L (ref 0.40–4.50)

## 2019-04-20 LAB — HEMOGLOBIN A1C
Hgb A1c MFr Bld: 5.5 % of total Hgb (ref <5.7)
Mean Plasma Glucose: 111 (calc)
eAG (mmol/L): 6.2 (calc)

## 2019-04-20 LAB — MAGNESIUM: Magnesium: 2.1 mg/dL (ref 1.5–2.5)

## 2019-04-20 LAB — VITAMIN D 25 HYDROXY (VIT D DEFICIENCY, FRACTURES): Vit D, 25-Hydroxy: 49 ng/mL (ref 30–100)

## 2019-04-20 MED ORDER — NITROFURANTOIN MONOHYD MACRO 100 MG PO CAPS: ORAL_CAPSULE | ORAL | 0 refills | Status: DC

## 2019-04-21 ENCOUNTER — Encounter: Payer: Self-pay | Admitting: Internal Medicine

## 2019-05-02 ENCOUNTER — Telehealth: Payer: Self-pay | Admitting: *Deleted

## 2019-05-02 NOTE — Telephone Encounter (Signed)
Patient called and complained of diarrhea, which started while taking Macrobid. The patient completed the RX, but loose stools continue.  Per Dr Melford Aase, she can try Imodium and she will eat a bland diet.

## 2019-05-24 ENCOUNTER — Ambulatory Visit: Payer: PPO

## 2019-06-21 ENCOUNTER — Ambulatory Visit (INDEPENDENT_AMBULATORY_CARE_PROVIDER_SITE_OTHER): Payer: PPO

## 2019-06-21 ENCOUNTER — Other Ambulatory Visit: Payer: Self-pay

## 2019-06-21 VITALS — Temp 97.3°F

## 2019-06-21 DIAGNOSIS — Z23 Encounter for immunization: Secondary | ICD-10-CM

## 2019-06-21 NOTE — Progress Notes (Signed)
Patient presents to the office for HD Flu Vaccine. Vaccine administered to Left Deltoid withoutanycomplication. Temperature taken and recorded 

## 2019-07-02 ENCOUNTER — Telehealth: Payer: Self-pay

## 2019-07-02 ENCOUNTER — Inpatient Hospital Stay (HOSPITAL_COMMUNITY)
Admission: EM | Admit: 2019-07-02 | Discharge: 2019-07-06 | DRG: 872 | Disposition: A | Discharge status: Skilled Nursing Facility | Payer: PPO | Attending: Internal Medicine | Admitting: Internal Medicine

## 2019-07-02 ENCOUNTER — Other Ambulatory Visit: Payer: Self-pay | Admitting: Internal Medicine

## 2019-07-02 ENCOUNTER — Inpatient Hospital Stay (HOSPITAL_COMMUNITY): Payer: PPO

## 2019-07-02 ENCOUNTER — Encounter (HOSPITAL_COMMUNITY): Payer: Self-pay

## 2019-07-02 ENCOUNTER — Other Ambulatory Visit: Payer: Self-pay

## 2019-07-02 ENCOUNTER — Emergency Department (HOSPITAL_COMMUNITY): Payer: PPO

## 2019-07-02 DIAGNOSIS — A4151 Sepsis due to Escherichia coli [E. coli]: Secondary | ICD-10-CM | POA: Diagnosis not present

## 2019-07-02 DIAGNOSIS — Z681 Body mass index (BMI) 19 or less, adult: Secondary | ICD-10-CM | POA: Diagnosis not present

## 2019-07-02 DIAGNOSIS — M6282 Rhabdomyolysis: Secondary | ICD-10-CM | POA: Diagnosis not present

## 2019-07-02 DIAGNOSIS — E872 Acidosis: Secondary | ICD-10-CM | POA: Diagnosis not present

## 2019-07-02 DIAGNOSIS — N390 Urinary tract infection, site not specified: Secondary | ICD-10-CM | POA: Diagnosis present

## 2019-07-02 DIAGNOSIS — I959 Hypotension, unspecified: Secondary | ICD-10-CM | POA: Diagnosis present

## 2019-07-02 DIAGNOSIS — W19XXXA Unspecified fall, initial encounter: Secondary | ICD-10-CM | POA: Diagnosis not present

## 2019-07-02 DIAGNOSIS — Z880 Allergy status to penicillin: Secondary | ICD-10-CM | POA: Diagnosis not present

## 2019-07-02 DIAGNOSIS — Z888 Allergy status to other drugs, medicaments and biological substances status: Secondary | ICD-10-CM | POA: Diagnosis not present

## 2019-07-02 DIAGNOSIS — M419 Scoliosis, unspecified: Secondary | ICD-10-CM | POA: Diagnosis not present

## 2019-07-02 DIAGNOSIS — M1990 Unspecified osteoarthritis, unspecified site: Secondary | ICD-10-CM | POA: Diagnosis present

## 2019-07-02 DIAGNOSIS — Z886 Allergy status to analgesic agent status: Secondary | ICD-10-CM

## 2019-07-02 DIAGNOSIS — E860 Dehydration: Secondary | ICD-10-CM | POA: Diagnosis present

## 2019-07-02 DIAGNOSIS — I10 Essential (primary) hypertension: Secondary | ICD-10-CM | POA: Diagnosis not present

## 2019-07-02 DIAGNOSIS — Z8249 Family history of ischemic heart disease and other diseases of the circulatory system: Secondary | ICD-10-CM | POA: Diagnosis not present

## 2019-07-02 DIAGNOSIS — Z8349 Family history of other endocrine, nutritional and metabolic diseases: Secondary | ICD-10-CM | POA: Diagnosis not present

## 2019-07-02 DIAGNOSIS — Z66 Do not resuscitate: Secondary | ICD-10-CM | POA: Diagnosis not present

## 2019-07-02 DIAGNOSIS — K567 Ileus, unspecified: Secondary | ICD-10-CM | POA: Diagnosis present

## 2019-07-02 DIAGNOSIS — A419 Sepsis, unspecified organism: Secondary | ICD-10-CM | POA: Diagnosis present

## 2019-07-02 DIAGNOSIS — E441 Mild protein-calorie malnutrition: Secondary | ICD-10-CM

## 2019-07-02 DIAGNOSIS — E876 Hypokalemia: Secondary | ICD-10-CM | POA: Diagnosis present

## 2019-07-02 DIAGNOSIS — E440 Moderate protein-calorie malnutrition: Secondary | ICD-10-CM | POA: Diagnosis not present

## 2019-07-02 DIAGNOSIS — I700 Atherosclerosis of aorta: Secondary | ICD-10-CM | POA: Diagnosis present

## 2019-07-02 DIAGNOSIS — Z881 Allergy status to other antibiotic agents status: Secondary | ICD-10-CM

## 2019-07-02 DIAGNOSIS — Z20828 Contact with and (suspected) exposure to other viral communicable diseases: Secondary | ICD-10-CM | POA: Diagnosis present

## 2019-07-02 DIAGNOSIS — Z91041 Radiographic dye allergy status: Secondary | ICD-10-CM

## 2019-07-02 DIAGNOSIS — D649 Anemia, unspecified: Secondary | ICD-10-CM

## 2019-07-02 DIAGNOSIS — Z8261 Family history of arthritis: Secondary | ICD-10-CM | POA: Diagnosis not present

## 2019-07-02 DIAGNOSIS — R7881 Bacteremia: Secondary | ICD-10-CM | POA: Diagnosis not present

## 2019-07-02 DIAGNOSIS — E785 Hyperlipidemia, unspecified: Secondary | ICD-10-CM | POA: Diagnosis present

## 2019-07-02 DIAGNOSIS — R000 Tachycardia, unspecified: Secondary | ICD-10-CM | POA: Diagnosis not present

## 2019-07-02 DIAGNOSIS — K5900 Constipation, unspecified: Secondary | ICD-10-CM | POA: Diagnosis not present

## 2019-07-02 DIAGNOSIS — I9589 Other hypotension: Secondary | ICD-10-CM | POA: Diagnosis not present

## 2019-07-02 DIAGNOSIS — R7989 Other specified abnormal findings of blood chemistry: Secondary | ICD-10-CM | POA: Diagnosis not present

## 2019-07-02 DIAGNOSIS — Z800 Family history of malignant neoplasm of digestive organs: Secondary | ICD-10-CM

## 2019-07-02 DIAGNOSIS — R52 Pain, unspecified: Secondary | ICD-10-CM | POA: Diagnosis not present

## 2019-07-02 DIAGNOSIS — E43 Unspecified severe protein-calorie malnutrition: Secondary | ICD-10-CM | POA: Diagnosis present

## 2019-07-02 DIAGNOSIS — R0602 Shortness of breath: Secondary | ICD-10-CM | POA: Diagnosis not present

## 2019-07-02 DIAGNOSIS — Z8042 Family history of malignant neoplasm of prostate: Secondary | ICD-10-CM | POA: Diagnosis not present

## 2019-07-02 DIAGNOSIS — B9620 Unspecified Escherichia coli [E. coli] as the cause of diseases classified elsewhere: Secondary | ICD-10-CM | POA: Diagnosis present

## 2019-07-02 DIAGNOSIS — L89152 Pressure ulcer of sacral region, stage 2: Secondary | ICD-10-CM | POA: Diagnosis present

## 2019-07-02 DIAGNOSIS — L89302 Pressure ulcer of unspecified buttock, stage 2: Secondary | ICD-10-CM | POA: Diagnosis not present

## 2019-07-02 DIAGNOSIS — Z91013 Allergy to seafood: Secondary | ICD-10-CM

## 2019-07-02 DIAGNOSIS — D509 Iron deficiency anemia, unspecified: Secondary | ICD-10-CM | POA: Diagnosis present

## 2019-07-02 HISTORY — DX: Sepsis, unspecified organism: A41.9

## 2019-07-02 LAB — CBC WITH DIFFERENTIAL/PLATELET
Abs Immature Granulocytes: 0.19 10*3/uL — ABNORMAL HIGH (ref 0.00–0.07)
Basophils Absolute: 0 10*3/uL (ref 0.0–0.1)
Basophils Relative: 0 %
Eosinophils Absolute: 0 10*3/uL (ref 0.0–0.5)
Eosinophils Relative: 0 %
HCT: 35.6 % — ABNORMAL LOW (ref 36.0–46.0)
Hemoglobin: 11.7 g/dL — ABNORMAL LOW (ref 12.0–15.0)
Immature Granulocytes: 1 %
Lymphocytes Relative: 2 %
Lymphs Abs: 0.4 10*3/uL — ABNORMAL LOW (ref 0.7–4.0)
MCH: 30.1 pg (ref 26.0–34.0)
MCHC: 32.9 g/dL (ref 30.0–36.0)
MCV: 91.5 fL (ref 80.0–100.0)
Monocytes Absolute: 0.6 10*3/uL (ref 0.1–1.0)
Monocytes Relative: 4 %
Neutro Abs: 14.2 10*3/uL — ABNORMAL HIGH (ref 1.7–7.7)
Neutrophils Relative %: 93 %
Platelets: 141 10*3/uL — ABNORMAL LOW (ref 150–400)
RBC: 3.89 MIL/uL (ref 3.87–5.11)
RDW: 14.5 % (ref 11.5–15.5)
WBC: 15.4 10*3/uL — ABNORMAL HIGH (ref 4.0–10.5)
nRBC: 0 % (ref 0.0–0.2)

## 2019-07-02 LAB — MAGNESIUM: Magnesium: 2.1 mg/dL (ref 1.7–2.4)

## 2019-07-02 LAB — COMPREHENSIVE METABOLIC PANEL
ALT: 45 U/L — ABNORMAL HIGH (ref 0–44)
AST: 103 U/L — ABNORMAL HIGH (ref 15–41)
Albumin: 4 g/dL (ref 3.5–5.0)
Alkaline Phosphatase: 99 U/L (ref 38–126)
Anion gap: 13 (ref 5–15)
BUN: 19 mg/dL (ref 8–23)
CO2: 23 mmol/L (ref 22–32)
Calcium: 9 mg/dL (ref 8.9–10.3)
Chloride: 102 mmol/L (ref 98–111)
Creatinine, Ser: 0.76 mg/dL (ref 0.44–1.00)
GFR calc Af Amer: >60 mL/min (ref >60)
GFR calc non Af Amer: >60 mL/min (ref >60)
Glucose, Bld: 135 mg/dL — ABNORMAL HIGH (ref 70–99)
Potassium: 2.7 mmol/L — CL (ref 3.5–5.1)
Sodium: 138 mmol/L (ref 135–145)
Total Bilirubin: 0.7 mg/dL (ref 0.3–1.2)
Total Protein: 6.4 g/dL — ABNORMAL LOW (ref 6.5–8.1)

## 2019-07-02 LAB — APTT: aPTT: 34 seconds (ref 24–36)

## 2019-07-02 LAB — PROTIME-INR
INR: 1.2 (ref 0.8–1.2)
Prothrombin Time: 15.1 seconds (ref 11.4–15.2)

## 2019-07-02 LAB — CK: Total CK: 3176 U/L — ABNORMAL HIGH (ref 38–234)

## 2019-07-02 LAB — LACTIC ACID, PLASMA
Lactic Acid, Venous: 3.7 mmol/L (ref 0.5–1.9)
Lactic Acid, Venous: 2.1 mmol/L (ref 0.5–1.9)

## 2019-07-02 LAB — SARS CORONAVIRUS 2 BY RT PCR (HOSPITAL ORDER, PERFORMED IN ~~LOC~~ HOSPITAL LAB): SARS Coronavirus 2: NEGATIVE

## 2019-07-02 LAB — PHOSPHORUS: Phosphorus: 2.5 mg/dL (ref 2.5–4.6)

## 2019-07-02 MED ORDER — POTASSIUM CHLORIDE 10 MEQ/100ML IV SOLN
10.0000 meq | INTRAVENOUS | Status: AC
  Administered 2019-07-02 – 2019-07-03 (×4): 10 meq via INTRAVENOUS
  Filled 2019-07-02 (×3): qty 100

## 2019-07-02 MED ORDER — SODIUM CHLORIDE 0.9 % IV BOLUS
1000.0000 mL | Freq: Once | INTRAVENOUS | Status: AC
  Administered 2019-07-02: 1000 mL via INTRAVENOUS

## 2019-07-02 MED ORDER — SODIUM CHLORIDE 0.9 % IV SOLN
1.0000 g | Freq: Once | INTRAVENOUS | Status: AC
  Administered 2019-07-02: 23:00:00 1 g via INTRAVENOUS
  Filled 2019-07-02: qty 1

## 2019-07-02 MED ORDER — SODIUM CHLORIDE 0.9 % IV BOLUS
1000.0000 mL | Freq: Once | INTRAVENOUS | Status: AC
  Administered 2019-07-02: 23:00:00 1000 mL via INTRAVENOUS

## 2019-07-02 MED ORDER — POTASSIUM CHLORIDE 10 MEQ/100ML IV SOLN
10.0000 meq | Freq: Once | INTRAVENOUS | Status: AC
  Administered 2019-07-02: 23:00:00 10 meq via INTRAVENOUS
  Filled 2019-07-02: qty 100

## 2019-07-02 MED ORDER — SODIUM CHLORIDE 0.9 % IV BOLUS
500.0000 mL | Freq: Once | INTRAVENOUS | Status: AC
  Administered 2019-07-03: 04:00:00 500 mL via INTRAVENOUS

## 2019-07-02 MED ORDER — ACETAMINOPHEN 325 MG PO TABS
650.0000 mg | ORAL_TABLET | Freq: Once | ORAL | Status: AC
  Administered 2019-07-02: 21:00:00 650 mg via ORAL
  Filled 2019-07-02: qty 2

## 2019-07-02 NOTE — ED Triage Notes (Signed)
Per EMS, Pt coming in after experiencing a fall this morning while in the bathroom. No LOC, neck/back pain.Complaining of left shoulder/leg pain, 8/10. Took a xanax this morning for the pain. No neurological changes noted. Hx of compacted bowels.

## 2019-07-02 NOTE — ED Notes (Signed)
CRITICAL VALUE STICKER  CRITICAL VALUE: Lactic 3.7  RECEIVER (on-site recipient of call): Diavion Labrador R RN   DATE & TIME NOTIFIED: 07/02/19 2155  MESSENGER (representative from lab):  MD NOTIFIED:  Trifan MD  TIME OF NOTIFICATION: 2156

## 2019-07-02 NOTE — ED Provider Notes (Signed)
[facility] DEPT Provider Note   CSN: [id number] Arrival date & time: 07/02/19  [id number]     History   Chief Complaint Fall, weakness  HPI Shelly Barrett is a 78 y.o. female with past medical history of malnourishment, arthritis, hyperlipidemia, scoliosis, diverticulitis, vitamin D deficiency presents emergency department today with chief complaint of fall and weakness.  She states this morning she was using the bathroom when she felt weak and her teeth shattered and she slid down the toilet landing on the ground.  She did not hit her head, no LOC.  She states she was therefore a couple hours because her husband was unable to help her up as he is weak himself.  She also reports having a large bowel movement this morning after manually disimpacting herself.  She is also reporting urinary frequency and dysuria, she was treated for UTI x1 month ago with macrobid.  She denies fever, chills, cough, chest pain, abdominal pain, nausea, vomiting, gross hematuria.  She is not anticoagulated.  Denies any sick contacts.    Past Medical History:  Diagnosis Date   Arthritis    Hyperlipidemia    Left femoral hernia without obstruction or gangrene 12/14/2017   On CT 12/13/2017   Prediabetes    Scoliosis    Sigmoid diverticulitis    Vitamin D deficiency     Patient Active Problem List   Diagnosis Date Noted   Alprazolam Over Sedation 07/02/2019   Scoliosis of thoracolumbar spine 01/19/2018   Aortic atherosclerosis ([place]) 12/14/2017   Malnutrition of mild degree ([place]) 06/23/2017   Body mass index (BMI) less than 16.5 12/31/2016   Hematuria, gross 07/27/2016   Fibrocystic breast changes, bilateral 08/28/2015   Medication management 11/25/2013   Hypertension    Hyperlipidemia    Other abnormal glucose    Vitamin D deficiency    Carotid stenosis 05/28/2010    Past Surgical History:  Procedure Laterality Date   BREAST SURGERY  1980, 2012   implants   COLONOSCOPY     TONSILLECTOMY AND ADENOIDECTOMY     TUBAL LIGATION       OB History   No obstetric history on file.      Home Medications    Prior to Admission medications   Medication Sig Start Date End Date Taking? Authorizing Provider  CALCIUM PO Take 250 mg by mouth.   Yes [provider]  cholecalciferol (VITAMIN D) 1000 UNITS tablet Take 2,000 Units by mouth daily.    Yes [provider]    Family History Family History  Problem Relation Age of Onset   Heart disease Mother    Hypertension Mother    Heart disease Father    Cancer Father        throat and prostate   Arthritis Sister        RA   Hyperlipidemia Brother    Colon cancer Neg Hx    Stomach cancer Neg Hx    Rectal cancer Neg Hx    Esophageal cancer Neg Hx    Alcohol abuse Neg Hx    Liver cancer Neg Hx     Social History Social History   Tobacco Use   Smoking status: Never Smoker   Smokeless tobacco: Never Used  Substance Use Topics   Alcohol use: No   Drug use: No     Allergies   Betadine [povidone iodine], Ciprocinonide [fluocinolone], Ciprofloxacin hcl, Flexeril [cyclobenzaprine], Ibuprofen, Macrobid [nitrofurantoin macrocrystal], Naprosyn [naproxen], Penicillins, Shellfish allergy, Sodium benzoate [  nutritional supplements], and Iodinated diagnostic agents   Review of Systems Review of Systems  Constitutional: Negative for chills and fever.  HENT: Negative for congestion, ear discharge, ear pain, sinus pressure, sinus pain and sore throat.   Eyes: Negative for pain and redness.  Respiratory: Negative for cough and shortness of breath.   Cardiovascular: Negative for chest pain.  Gastrointestinal: Negative for abdominal pain, constipation, diarrhea, nausea and vomiting.  Genitourinary: Positive for dysuria and frequency. Negative for hematuria.  Musculoskeletal: Negative for back pain and neck pain.  Skin: Negative for rash and wound.    Neurological: Positive for weakness. Negative for numbness and headaches.     Physical Exam Updated Vital Signs BP (!) 97/46    Pulse 78    Temp 99.7 F (37.6 C) (Oral)    Resp (!) 22    SpO2 96%   Physical Exam Vitals signs and nursing note reviewed.  Constitutional:      General: She is not in acute distress.    Comments: Chronically ill-appearing female  HENT:     Head: Normocephalic and atraumatic.     Comments: No tenderness to palpation of skull. No deformities or crepitus noted. No open wounds, abrasions or lacerations.    Right Ear: Tympanic membrane and external ear normal.     Left Ear: Tympanic membrane and external ear normal.     Nose: Nose normal.     Mouth/Throat:     Mouth: Mucous membranes are moist.     Pharynx: Oropharynx is clear.  Eyes:     General: No scleral icterus.       Right eye: No discharge.        Left eye: No discharge.     Extraocular Movements: Extraocular movements intact.     Conjunctiva/sclera: Conjunctivae normal.     Pupils: Pupils are equal, round, and reactive to light.  Neck:     Musculoskeletal: Normal range of motion.     Vascular: No JVD.     Comments: Full ROM intact without spinous process TTP. No bony stepoffs or deformities, no paraspinous muscle TTP or muscle spasms. No rigidity or meningeal signs. No bruising, erythema, or swelling.  Cardiovascular:     Rate and Rhythm: Normal rate and regular rhythm.     Pulses: Normal pulses.          Radial pulses are 2+ on the right side and 2+ on the left side.     Heart sounds: Normal heart sounds.  Pulmonary:     Comments: Lungs clear to auscultation in all fields. Symmetric chest rise. No wheezing, rales, or rhonchi. Abdominal:     Comments: Abdomen is soft, non-distended, and non-tender in all quadrants. No rigidity, no guarding. No peritoneal signs.  Musculoskeletal: Normal range of motion.     Right lower leg: No edema.     Left lower leg: No edema.     Comments: Full range  of motion of the T-spine and L-spine No tenderness to palpation of the spinous processes of the T-spine or L-spine No crepitus, deformity or step-offs No tenderness to palpation of the paraspinous muscles of the L-spine     Skin:    General: Skin is warm and dry.     Capillary Refill: Capillary refill takes less than 2 seconds.     Findings: No rash.  Neurological:     Mental Status: She is oriented to person, place, and time.     GCS: GCS eye subscore is 4. GCS  verbal subscore is 5. GCS motor subscore is 6.     Comments: Fluent speech, no facial droop.  Psychiatric:        Behavior: Behavior normal.      ED Treatments / Results  Labs (all labs ordered are listed, but only abnormal results are displayed) Labs Reviewed  LACTIC ACID, PLASMA - Abnormal; Notable for the following components:      Result Value   Lactic Acid, Venous 3.7 (*)    All other components within normal limits  COMPREHENSIVE METABOLIC PANEL - Abnormal; Notable for the following components:   Potassium 2.7 (*)    Glucose, Bld 135 (*)    Total Protein 6.4 (*)    AST 103 (*)    ALT 45 (*)    All other components within normal limits  CBC WITH DIFFERENTIAL/PLATELET - Abnormal; Notable for the following components:   WBC 15.4 (*)    Hemoglobin 11.7 (*)    HCT 35.6 (*)    Platelets 141 (*)    Neutro Abs 14.2 (*)    Lymphs Abs 0.4 (*)    Abs Immature Granulocytes 0.19 (*)    All other components within normal limits  CK - Abnormal; Notable for the following components:   Total CK 3,176 (*)    All other components within normal limits  CULTURE, BLOOD (ROUTINE X 2)  SARS CORONAVIRUS 2 (HOSPITAL ORDER, PERFORMED IN [facility])  CULTURE, BLOOD (ROUTINE X 2)  URINE CULTURE  APTT  PROTIME-INR  LACTIC ACID, PLASMA  URINALYSIS, ROUTINE W REFLEX MICROSCOPIC    EKG EKG Interpretation  Date/Time:  Monday July 02 2019 20:53:33 EDT Ventricular Rate:  103 PR Interval:    QRS  Duration: 84 QT Interval:  392 QTC Calculation: 514 R Axis:   79 Text Interpretation:  Sinus tachycardia Minimal ST depression, diffuse leads Prolonged QT interval Baseline wander in lead(s) V1 No STEMI  Confirmed by Octaviano Glow [phone]) on 07/02/2019 9:25:37 PM   Radiology Dg Chest Port 1 View  Result Date: 07/02/2019 CLINICAL DATA:  Shortness of breath today. Status post fall this morning. EXAM: PORTABLE CHEST 1 VIEW COMPARISON:  None. FINDINGS: Lungs are clear. Heart size is normal. No pneumothorax or pleural effusion. Scoliosis noted. No acute bony abnormality. IMPRESSION: No acute disease. Electronically Signed   By: Inge Rise M.D.   On: 07/02/2019 20:39    Procedures .Critical Care Performed by: Cherre Robins, PA-C Authorized by: Cherre Robins, PA-C   Critical care provider statement:    Critical care time (minutes):  40   Critical care time was exclusive of:  Separately billable procedures and treating other patients and teaching time   Critical care was necessary to treat or prevent imminent or life-threatening deterioration of the following conditions:  Sepsis   Critical care was time spent personally by me on the following activities:  Examination of patient, evaluation of patient's response to treatment, discussions with consultants, development of treatment plan with patient or surrogate, obtaining history from patient or surrogate, ordering and review of radiographic studies, ordering and review of laboratory studies, review of old charts and re-evaluation of patient's condition   (including critical care time)  Medications Ordered in ED Medications  potassium chloride 10 mEq in 100 mL IVPB (10 mEq Intravenous New Bag/Given 07/02/19 2256)  meropenem (MERREM) 1 g in sodium chloride 0.9 % 100 mL IVPB (1 g Intravenous New Bag/Given 07/02/19 2259)  sodium chloride 0.9 % bolus 1,000 mL (1,000  mLs Intravenous New Bag/Given 07/02/19 2038)  acetaminophen  (TYLENOL) tablet 650 mg (650 mg Oral Given 07/02/19 2123)  sodium chloride 0.9 % bolus 1,000 mL (0 mLs Intravenous Stopped 07/02/19 2259)     Initial Impression / Assessment and Plan / ED Course  I have reviewed the triage vital signs and the nursing notes.  Pertinent labs & imaging results that were available during my care of the patient were reviewed by me and considered in my medical decision making (see chart for details).  Clinical Course as of Jul 03 19  Mon Jul 02, 2019  2125 Patient seen by myself as well as PA provider.  Briefly this is a 78 year old female with a history of malnourishment presenting to the emergency department with a fall.  She reports that she lost her balance going to the bathroom today and fell to the bathroom floor, and was unable to get up off the floor for approximately 2 hours.  She reports this is in the setting of having a very large bowel movement after digital disimpaction.  She subsequently stooled on herself.  She called her son was able to come to the house and pick her up and clean her off.  She now presents emergency department feeling generally weak.  She describes some urinary discomfort and states she was treated for UTI perhaps 2 weeks ago.  She denies any fevers, denies any cough or respiratory symptoms.  On my exam the patient is mildly tachycardic and has a blood pressure of [id number] systolic.  She is oriented x3.  She appears thin and frail.  Her lungs are clear to auscultation.  She has no focal abdominal tenderness.  She has no visible rash on my exam.  Based on her initial presentation with a fever and tachycardia we will obtain a sepsis work-up, check her creatinine function and lactate here.  Attempted localize a source.  We will give her fluids.   [MT]  2231 Suspected urosepsis.  Hypotensive but appears to be hypotensive at baseline.  We will continue with 2 full liters of IV fluids which is approximately 30 cc/kg bolus.  We will also give IV  meropenem for urosepsis as well as replating potassium here.  Patient will need admission severe sepsis   [MT]  2320 SARS Coronavirus 2: NEGATIVE [MT]    Clinical Course User Index [MT] Trifan, Carola Rhine, MD      Final Clinical Impressions(s) / ED Diagnoses   Final diagnoses:  Sepsis, due to unspecified organism, unspecified whether acute organ dysfunction present [facility])   Patient seen and examined. Patient is chronically ill-appearing.  On arrival she is hypotensive to 97/46 with fever 100.8, tachycardic to 120.  Sepsis work up initiated.  Discussed with the ED attending Dr. Langston Masker who agrees with holding off on antibiotics as we do not have an obvious source and would like to see kidney function before starting strong antibiotics. Tylenol given for fever and IVF started Work-up is significant for leukocytosis 15.4, lactic acid 3.7, hypokalemia of 2.7, CK of 3176.  Potassium replaced IV, second liter fluids started.  Given source is likely urosepsis, started IV meropenem as she has penicillin allergy.  Chest x-ray viewed by me is negative for any infiltrate, pneumonia unlikely.  Patient is continued to be hypotensive, she is hypotensive at baseline on chart review, will hold off on pressors for now.  Covid test is negative.  EKG without ischemic changes.  This case was discussed with Dr. Langston Masker who has seen  the patient and agrees with plan to admit.  UA still pending.  Spoke with Dr. Roel Cluck with hospitalist service who agrees to assume care of patient and bring into the hospital for further evaluation and management.      Portions of this note were generated with [employer]. Dictation errors may occur despite best attempts at proofreading.   ED Discharge Orders    None       Flint Melter 07/03/19 0024    Wyvonnia Dusky, MD 07/03/19 [phone]

## 2019-07-02 NOTE — Telephone Encounter (Signed)
Shelly Barrett, patient's son states that his mother had fallen off of the toilet and defecated on herself while laying on the floor for two hours. Shelly Barrett states that she had taken a Xanax prior to this happening on an empty stomach.  Wanted to let us know and that he is having [facility] coming in to evaluate her and will let us know status.

## 2019-07-02 NOTE — H&P (Signed)
Shelly Barrett WJX:[id number] DOB: 1941-07-28 DOA: 07/02/2019     PCP: Lucky Cowboy, MD   Outpatient Specialists:  none   Patient arrived to ER on 07/02/19 at 1852  Patient coming from: home Lives   With family husband of dementia    Chief Complaint: Fall unable to get up  HPI: Shelly Barrett is a 78 y.o. female with medical history significant of poor p.o. intake, scoliosis,    HLD    Presented with   a fall that occurred today while on commode.  Patient have had decreased p.o. intake is been chronic.  Reports she has been having significant constipation for which she tried to use MiraLAX did not seem to help much then she used her hand to try to disimpact herself with a lot of stool came out and then she became incontinent of multiple large bowel movements she had to call her son to clean her up. Patient was on the toilet today when she slid down and could not get up she requested her husband to try to help her he has history of dementia and unable to get her up.  She had to call her son who brought into emergency department She has been laying on the floor for about 2 hours before anybody could help her to get up Patient has recently taken a Xanax prior to falling. She have had very poor intake for weeks states that she just does not like to eat She has history of malnutrition to begin with History of soft blood pressures Last month patient had a urinary tract infection was treated with Macrobid but developed diarrhea thereafter This time also reporting some urinary frequency and dysuria Patient denies hitting her head no recent sick contacts Is generalized weakness and fatigue but no localized findings  Infectious risk factors:  Reports chills In  ER RAPID COVID TEST NEGATIVE   Lab Results  Component Value Date   SARSCOV2NAA NEGATIVE 07/02/2019     Regarding pertinent Chronic problems:  Nutritional status followed by her outpatient provider  While in ER:  Initially was reporting left-sided pain in her shoulder  The following Work up has been ordered so far:  Orders Placed This Encounter  Procedures  . Critical Care  . Blood Culture (routine x 2)  . Urine culture  . SARS Coronavirus 2 [facility] order, Performed in [facility] hospital lab) Nasopharyngeal Nasopharyngeal Swab  . DG Chest Port 1 View  . Lactic acid, plasma  . Comprehensive metabolic panel  . CBC WITH DIFFERENTIAL  . APTT  . Protime-INR  . Urinalysis, Routine w reflex microscopic  . CK  . Diet NPO time specified  . Cardiac monitoring  . Refer to Sidebar Report: Sepsis Sidebar ED/IP  . Document vital signs within 1-hour of fluid bolus completion and notify provider of bolus completion  . Document Actual / Estimated Weight  . Initiate Carrier Fluid Protocol  . Vital signs  . Initiate Code Sepsis ([facility] [phone]) Reason for Consult? tracking  . meropenem (MERREM)  per pharmacyconsult  . Consult to hospitalist  ALL PATIENTS BEING ADMITTED/HAVING PROCEDURES NEED COVID-19 SCREENING  . Pulse oximetry, continuous  . ED EKG 12-Lead  . EKG 12-Lead     Following Medications were ordered in ER: Medications  potassium chloride 10 mEq in 100 mL IVPB (10 mEq Intravenous New Bag/Given 07/02/19 2256)  meropenem (MERREM) 1 g in sodium chloride 0.9 % 100 mL IVPB (1 g Intravenous New Bag/Given 07/02/19 2259)  sodium chloride 0.9 % bolus 1,000 mL (1,000 mLs Intravenous New Bag/Given 07/02/19 2038)  acetaminophen (TYLENOL) tablet 650 mg (650 mg Oral Given 07/02/19 2123)  sodium chloride 0.9 % bolus 1,000 mL (0 mLs Intravenous Stopped 07/02/19 2259)        Consult Orders  (From admission, onward)         Start     Ordered   07/02/19 2303  Consult to hospitalist  ALL PATIENTS BEING ADMITTED/HAVING PROCEDURES NEED COVID-19 SCREENING  Once    Comments: ALL PATIENTS BEING ADMITTED/HAVING PROCEDURES NEED COVID-19 SCREENING  Provider:  (Not yet assigned)  Question Answer  Comment  Place call to: [facility]   Reason for Consult Admit   Diagnosis/Clinical Info for Consult: sepsis, possible urosepsis      07/02/19 2302           Significant initial  Findings: Abnormal Labs Reviewed  LACTIC ACID, PLASMA - Abnormal; Notable for the following components:      Result Value   Lactic Acid, Venous 3.7 (*)    All other components within normal limits  COMPREHENSIVE METABOLIC PANEL - Abnormal; Notable for the following components:   Potassium 2.7 (*)    Glucose, Bld 135 (*)    Total Protein 6.4 (*)    AST 103 (*)    ALT 45 (*)    All other components within normal limits  CBC WITH DIFFERENTIAL/PLATELET - Abnormal; Notable for the following components:   WBC 15.4 (*)    Hemoglobin 11.7 (*)    HCT 35.6 (*)    Platelets 141 (*)    Neutro Abs 14.2 (*)    Lymphs Abs 0.4 (*)    Abs Immature Granulocytes 0.19 (*)    All other components within normal limits  CK - Abnormal; Notable for the following components:   Total CK 3,176 (*)    All other components within normal limits    Otherwise labs showing:    Recent Labs  Lab 07/02/19 2041  NA 138  K 2.7*  CO2 23  GLUCOSE 135*  BUN 19  CREATININE 0.76  CALCIUM 9.0    Cr   Up from baseline see below Lab Results  Component Value Date   CREATININE 0.76 07/02/2019   CREATININE 0.63 04/19/2019   CREATININE 0.56 (L) 10/19/2018    Recent Labs  Lab 07/02/19 2041  AST 103*  ALT 45*  ALKPHOS 99  BILITOT 0.7  PROT 6.4*  ALBUMIN 4.0   Lab Results  Component Value Date   CALCIUM 9.0 07/02/2019     WBC      Component Value Date/Time   WBC 15.4 (H) 07/02/2019 2041   ANC    Component Value Date/Time   NEUTROABS 14.2 (H) 07/02/2019 2041     Plt: Lab Results  Component Value Date   PLT 141 (L) 07/02/2019   Lactic Acid, Venous    Component Value Date/Time   LATICACIDVEN 3.7 (HH) 07/02/2019 2000    Procalcitonin   Ordered   COVID-19 Labs    Lab Results  Component Value  Date   SARSCOV2NAA NEGATIVE 07/02/2019      HG/HCT  Stable,     Component Value Date/Time   HGB 11.7 (L) 07/02/2019 2041   HCT 35.6 (L) 07/02/2019 2041       Cardiac Panel (last 3 results) Recent Labs    07/02/19 2041  CKTOTAL 3,176*       ECG: Ordered Personally reviewed by me showing: HR :  103 Rhythm:  Sinus tachycardia     no evidence of ischemic changes QTC 514    DM  labs:  HbA1C: Recent Labs    04/19/19 1411  HGBA1C 5.5       CBG (last 3)  No results for input(s): GLUCAP in the last 72 hours.    UA   ordered   Urine analysis:    Component Value Date/Time   COLORURINE YELLOW 04/19/2019 1411   APPEARANCEUR CLOUDY (A) 04/19/2019 1411   LABSPEC 1.019 04/19/2019 1411   PHURINE < OR = 5.0 04/19/2019 1411   GLUCOSEU NEGATIVE 04/19/2019 1411   HGBUR 3+ (A) 04/19/2019 1411   BILIRUBINUR NEGATIVE 12/31/2016 1015   KETONESUR NEGATIVE 04/19/2019 1411   PROTEINUR 2+ (A) 04/19/2019 1411   UROBILINOGEN 1 10/15/2013 1501   NITRITE POSITIVE (A) 04/19/2019 1411   LEUKOCYTESUR 2+ (A) 04/19/2019 1411       Ordered  KUB showing early dilated small bowels in the pelvis and left lower abdomen ileus versus small bowel obstruction  CXR -  NON acute     ED Triage Vitals  Enc Vitals Group     BP 07/02/19 1918 (!) 97/46     Pulse Rate 07/02/19 1918 78     Resp 07/02/19 1918 (!) 22     Temp 07/02/19 1918 99.7 F (37.6 C)     Temp Source 07/02/19 1918 Oral     SpO2 07/02/19 1903 97 %     Weight --      Height --      Head Circumference --      Peak Flow --      Pain Score 07/02/19 1918 7     Pain Loc --      Pain Edu? --      Excl. in GC? --   TMAX(24)@       Latest  Blood pressure (!) 81/48, pulse 88, temperature 99.7 F (37.6 C), temperature source Oral, resp. rate (!) 27, SpO2 97 %.    Hospitalist was called for admission for dehydration rhabdomyolysis   Review of Systems:    Pertinent positives include: chills, fatigue  Constitutional:  No  weight loss, night sweats, Fevers, , weight loss  HEENT:  No headaches, Difficulty swallowing,Tooth/dental problems,Sore throat,  No sneezing, itching, ear ache, nasal congestion, post nasal drip,  Cardio-vascular:  No chest pain, Orthopnea, PND, anasarca, dizziness, palpitations.no Bilateral lower extremity swelling  GI:  No heartburn, indigestion, abdominal pain, nausea, vomiting, diarrhea, change in bowel habits, loss of appetite, melena, blood in stool, hematemesis Resp:  no shortness of breath at rest. No dyspnea on exertion, No excess mucus, no productive cough, No non-productive cough, No coughing up of blood.No change in color of mucus.No wheezing. Skin:  no rash or lesions. No jaundice GU:  no dysuria, change in color of urine, no urgency or frequency. No straining to urinate.  No flank pain.  Musculoskeletal:  No joint pain or no joint swelling. No decreased range of motion. No back pain.  Psych:  No change in mood or affect. No depression or anxiety. No memory loss.  Neuro: no localizing neurological complaints, no tingling, no weakness, no double vision, no gait abnormality, no slurred speech, no confusion  All systems reviewed and apart from HOPI all are negative  Past Medical History:   Past Medical History:  Diagnosis Date  . Arthritis   . Hyperlipidemia   . Left femoral hernia without obstruction or gangrene 12/14/2017   On  CT 12/13/2017  . Prediabetes   . Scoliosis   . Sigmoid diverticulitis   . Vitamin D deficiency       Past Surgical History:  Procedure Laterality Date  . BREAST SURGERY  1980, 2012   implants  . COLONOSCOPY    . TONSILLECTOMY AND ADENOIDECTOMY    . TUBAL LIGATION      Social History:  Ambulatory   Independently      reports that she has never smoked. She has never used smokeless tobacco. She reports that she does not drink alcohol or use drugs.    Family History:   Family History  Problem Relation Age of Onset  . Heart disease  Mother   . Hypertension Mother   . Heart disease Father   . Cancer Father        throat and prostate  . Arthritis Sister        RA  . Hyperlipidemia Brother   . Colon cancer Neg Hx   . Stomach cancer Neg Hx   . Rectal cancer Neg Hx   . Esophageal cancer Neg Hx   . Alcohol abuse Neg Hx   . Liver cancer Neg Hx     Allergies: Allergies  Allergen Reactions  . Betadine [Povidone Iodine]   . Ciprocinonide [Fluocinolone]   . Ciprofloxacin Hcl     Hives  . Flexeril [Cyclobenzaprine]   . Ibuprofen   . Macrobid [Nitrofurantoin Macrocrystal] Diarrhea, Nausea And Vomiting and Swelling  . Naprosyn [Naproxen]   . Penicillins     REACTION: rash  . Shellfish Allergy   . Sodium Benzoate [Nutritional Supplements]   . Iodinated Diagnostic Agents Rash     Prior to Admission medications   Medication Sig Start Date End Date Taking? Authorizing Provider  CALCIUM PO Take 250 mg by mouth.   Yes [provider]  cholecalciferol (VITAMIN D) 1000 UNITS tablet Take 2,000 Units by mouth daily.    Yes [provider]   Physical Exam: Blood pressure (!) 81/48, pulse 88, temperature 99.7 F (37.6 C), temperature source Oral, resp. rate (!) 27, SpO2 97 %. 1. General:  in No  Acute distress   Chronically ill   -appearing 2. Psychological: Alert and   Oriented 3. Head/ENT:   Dry Mucous Membranes                          Head Non traumatic, neck supple                            Poor Dentition 4. SKIN:  decreased Skin turgor,  Skin clean Dry and intact no rash 5. Heart: Regular rate and rhythm no  Murmur, no Rub or gallop 6. Lungs:  no wheezes or crackles   7. Abdomen: Soft,  non-tender,  distended  bowel sounds present 8. Lower extremities: no clubbing, cyanosis, no  edema 9. Neurologically Grossly intact, moving all 4 extremities equally   10. MSK: Normal range of motion   All other LABS:     Recent Labs  Lab 07/02/19 2041  WBC 15.4*  NEUTROABS 14.2*  HGB 11.7*  HCT  35.6*  MCV 91.5  PLT 141*     Recent Labs  Lab 07/02/19 2041  NA 138  K 2.7*  CL 102  CO2 23  GLUCOSE 135*  BUN 19  CREATININE 0.76  CALCIUM 9.0     Recent Labs  Lab  07/02/19 2041  AST 103*  ALT 45*  ALKPHOS 99  BILITOT 0.7  PROT 6.4*  ALBUMIN 4.0       Cultures:    Component Value Date/Time   SDES  07/02/2019 2042    BLOOD RIGHT FOREARM Performed at [facility], [address], [place], [place] [address]    SPECREQUEST  07/02/2019 2042    BOTTLES DRAWN AEROBIC AND ANAEROBIC Blood Culture results may not be optimal due to an inadequate volume of blood received in culture bottles Performed at [facility], [address] [place], [place] [address]    CULT PENDING 07/02/2019 2042   REPTSTATUS PENDING 07/02/2019 2042     Radiological Exams on Admission: Dg Chest Port 1 View  Result Date: 07/02/2019 CLINICAL DATA:  Shortness of breath today. Status post fall this morning. EXAM: PORTABLE CHEST 1 VIEW COMPARISON:  None. FINDINGS: Lungs are clear. Heart size is normal. No pneumothorax or pleural effusion. Scoliosis noted. No acute bony abnormality. IMPRESSION: No acute disease. Electronically Signed   By: Drusilla Kanner M.D.   On: 07/02/2019 20:39    Chart has been reviewed    Assessment/Plan  78 y.o. female with medical history significant of poor p.o. intake, scoliosis,    HLD     Admitted for sepsis dehydration rhabdomyolysis  Present on Admission: . Sepsis (HCC) -evaded white blood cell count and lactic acid significant hypotension unclear if true source of infectious process versus dehydration and hemoconcentration.  For right now given significant illness will continue meropenem IV antibiotics await results of blood cultures.  Admit to stepdown given severity of illness patient does not wish to be intubated or resuscitated She states that she does not wish any additional scans to be done  . Hypertension -patient denies not  on any home medications currently very soft blood pressures we will continue to monitor . Hyperlipidemia continue home medications  . Malnutrition of mild degree (HCC) we will check prealbumin and order nutritional consult check electrolytes and replete as needed give IV thiamine  . Ileus (HCC) -plain imaging was worrisome for ileus versus early small bowel obstruction Not consistent with history of severe constipation and manual disimpaction followed by large amount of stool. For now rest bowels serial clinical exams.  Discussed with patient in the morning possibility of further imaging to further evaluate versus repeat KUB  . Hypokalemia -replace and continue to follow check magnesium level  . Rhabdomyolysis -would rehydrate and follow repeat in a.m. patient likely with significant dehydration]  . Dehydration -rehydrate follow clinically check orthostatics will need PT OT evaluation prior to discharge given a fall  . Anemia -likely hemoglobin to go down after IV fluid rehydration.  Obtain anemia panel Hemoccult stool  Abnormal EKG -denies any history of chest pain monitor on telemetry obtain set of cardiac markers If significantly abnormal will need to discuss with patient further if she would like to have any aggressive interventions at this time  Other plan as per orders.  DVT prophylaxis:  SCD  Code Status:DNR/DNI  as per patient  I had personally discussed CODE STATUS with patient Family Communication:   Family not at  Bedside   Disposition Plan:     To home once workup is complete and patient is stable                     Would benefit from PT/OT eval prior to DC  Ordered  Nutrition    consulted                                       Consults called: none  Admission status:  ED Disposition    ED Disposition Condition Comment   Admit  Hospital Area: [facility] [[id number]]  Level of Care: Stepdown [14]  Admit to SDU  based on following criteria: Hemodynamic compromise or significant risk of instability:  Patient requiring short term acute titration and management of vasoactive drips, and invasive monitoring (i.e., CVP and Arterial line).  Covid Evaluation: Confirmed COVID Negative  Diagnosis: Sepsis [facility]) [[id number]]  Admitting Physician: Therisa Doyne [[id number]]  Attending Physician: Therisa Doyne [[id number]]  Estimated length of stay: 3 - 4 days  Certification:: I certify this patient will need inpatient services for at least 2 midnights  PT Class (Do Not Modify): Inpatient [101]  PT Acc Code (Do Not Modify): Private [1]          inpatient     Expect 2 midnight stay secondary to severity of patient's current illness including   hemodynamic instability despite optimal treatment (   hypotension )   Severe lab/radiological/exam abnormalities including:  Elevated CK    and extensive comorbidities including: malnutrition  That are currently affecting medical management.   I expect  patient to be hospitalized for 2 midnights requiring inpatient medical care.  Patient is at high risk for adverse outcome (such as loss of life or disability) if not treated.  Indication for inpatient stay as follows:    Hemodynamic instability despite maximal medical therapy,    inability to maintain oral hydration     Need for IV antibiotics, IV fluids,     Level of care    SDU tele indefinitely please discontinue once patient no longer qualifies  Precautions:  No active isolations  PPE: Used by the provider:   P100  eye Goggles,  Gloves    Gared Gillie 07/03/2019, 12:33 AM    [facility]     after 2 AM please page floor coverage PA If 7AM-7PM, please contact the day team taking care of the patient using [email or web address]

## 2019-07-03 ENCOUNTER — Inpatient Hospital Stay (HOSPITAL_COMMUNITY): Payer: PPO

## 2019-07-03 ENCOUNTER — Other Ambulatory Visit: Payer: Self-pay

## 2019-07-03 DIAGNOSIS — B9620 Unspecified Escherichia coli [E. coli] as the cause of diseases classified elsewhere: Secondary | ICD-10-CM

## 2019-07-03 DIAGNOSIS — M6282 Rhabdomyolysis: Secondary | ICD-10-CM | POA: Diagnosis present

## 2019-07-03 DIAGNOSIS — D649 Anemia, unspecified: Secondary | ICD-10-CM

## 2019-07-03 DIAGNOSIS — I959 Hypotension, unspecified: Secondary | ICD-10-CM | POA: Diagnosis present

## 2019-07-03 DIAGNOSIS — K567 Ileus, unspecified: Secondary | ICD-10-CM | POA: Diagnosis present

## 2019-07-03 DIAGNOSIS — R7989 Other specified abnormal findings of blood chemistry: Secondary | ICD-10-CM

## 2019-07-03 DIAGNOSIS — I9589 Other hypotension: Secondary | ICD-10-CM

## 2019-07-03 DIAGNOSIS — N390 Urinary tract infection, site not specified: Secondary | ICD-10-CM | POA: Diagnosis present

## 2019-07-03 DIAGNOSIS — E876 Hypokalemia: Secondary | ICD-10-CM | POA: Diagnosis present

## 2019-07-03 DIAGNOSIS — R7881 Bacteremia: Secondary | ICD-10-CM | POA: Diagnosis present

## 2019-07-03 DIAGNOSIS — M419 Scoliosis, unspecified: Secondary | ICD-10-CM

## 2019-07-03 DIAGNOSIS — E861 Hypovolemia: Secondary | ICD-10-CM

## 2019-07-03 HISTORY — DX: Unspecified Escherichia coli (E. coli) as the cause of diseases classified elsewhere: B96.20

## 2019-07-03 HISTORY — DX: Ileus, unspecified: K56.7

## 2019-07-03 HISTORY — DX: Bacteremia: R78.81

## 2019-07-03 LAB — URINALYSIS, ROUTINE W REFLEX MICROSCOPIC
Bilirubin Urine: NEGATIVE
Glucose, UA: NEGATIVE mg/dL
Ketones, ur: 5 mg/dL — AB
Nitrite: POSITIVE — AB
Protein, ur: NEGATIVE mg/dL
Specific Gravity, Urine: 1.006 (ref 1.005–1.030)
pH: 5 (ref 5.0–8.0)

## 2019-07-03 LAB — BLOOD CULTURE ID PANEL (REFLEXED)

## 2019-07-03 LAB — COMPREHENSIVE METABOLIC PANEL
ALT: 47 U/L — ABNORMAL HIGH (ref 0–44)
AST: 126 U/L — ABNORMAL HIGH (ref 15–41)
Albumin: 3.3 g/dL — ABNORMAL LOW (ref 3.5–5.0)
Alkaline Phosphatase: 86 U/L (ref 38–126)
Anion gap: 9 (ref 5–15)
BUN: 16 mg/dL (ref 8–23)
CO2: 22 mmol/L (ref 22–32)
Calcium: 8.2 mg/dL — ABNORMAL LOW (ref 8.9–10.3)
Chloride: 110 mmol/L (ref 98–111)
Creatinine, Ser: 0.68 mg/dL (ref 0.44–1.00)
GFR calc Af Amer: >60 mL/min (ref >60)
GFR calc non Af Amer: >60 mL/min (ref >60)
Glucose, Bld: 95 mg/dL (ref 70–99)
Potassium: 3.1 mmol/L — ABNORMAL LOW (ref 3.5–5.1)
Sodium: 141 mmol/L (ref 135–145)
Total Bilirubin: 0.4 mg/dL (ref 0.3–1.2)
Total Protein: 5.8 g/dL — ABNORMAL LOW (ref 6.5–8.1)

## 2019-07-03 LAB — TSH: TSH: 0.932 u[IU]/mL (ref 0.350–4.500)

## 2019-07-03 LAB — FERRITIN: Ferritin: 140 ng/mL (ref 11–307)

## 2019-07-03 LAB — CBC
HCT: 33.3 % — ABNORMAL LOW (ref 36.0–46.0)
Hemoglobin: 10.9 g/dL — ABNORMAL LOW (ref 12.0–15.0)
MCH: 30 pg (ref 26.0–34.0)
MCHC: 32.7 g/dL (ref 30.0–36.0)
MCV: 91.7 fL (ref 80.0–100.0)
Platelets: 142 10*3/uL — ABNORMAL LOW (ref 150–400)
RBC: 3.63 MIL/uL — ABNORMAL LOW (ref 3.87–5.11)
RDW: 14.5 % (ref 11.5–15.5)
WBC: 17.3 10*3/uL — ABNORMAL HIGH (ref 4.0–10.5)
nRBC: 0 % (ref 0.0–0.2)

## 2019-07-03 LAB — IRON AND TIBC
Iron: 9 ug/dL — ABNORMAL LOW (ref 28–170)
Saturation Ratios: 3 % — ABNORMAL LOW (ref 10.4–31.8)
TIBC: 311 ug/dL (ref 250–450)
UIBC: 302 ug/dL

## 2019-07-03 LAB — MAGNESIUM
Magnesium: 1.9 mg/dL (ref 1.7–2.4)
Magnesium: 1.9 mg/dL (ref 1.7–2.4)

## 2019-07-03 LAB — PROTIME-INR
INR: 1.4 — ABNORMAL HIGH (ref 0.8–1.2)
Prothrombin Time: 17.3 seconds — ABNORMAL HIGH (ref 11.4–15.2)

## 2019-07-03 LAB — CK
Total CK: 5242 U/L — ABNORMAL HIGH (ref 38–234)
Total CK: 5011 U/L — ABNORMAL HIGH (ref 38–234)

## 2019-07-03 LAB — LACTIC ACID, PLASMA
Lactic Acid, Venous: 1.7 mmol/L (ref 0.5–1.9)
Lactic Acid, Venous: 2.3 mmol/L (ref 0.5–1.9)

## 2019-07-03 LAB — TROPONIN I (HIGH SENSITIVITY)
Troponin I (High Sensitivity): 776 ng/L (ref <18)
Troponin I (High Sensitivity): 665 ng/L (ref <18)
Troponin I (High Sensitivity): 512 ng/L (ref <18)

## 2019-07-03 LAB — RETICULOCYTES
Immature Retic Fract: 8.4 % (ref 2.3–15.9)
RBC.: 3.63 MIL/uL — ABNORMAL LOW (ref 3.87–5.11)
Retic Count, Absolute: 28.7 10*3/uL (ref 19.0–186.0)
Retic Ct Pct: 0.8 % (ref 0.4–3.1)

## 2019-07-03 LAB — APTT: aPTT: 43 seconds — ABNORMAL HIGH (ref 24–36)

## 2019-07-03 LAB — MRSA PCR SCREENING: MRSA by PCR: NEGATIVE

## 2019-07-03 LAB — VITAMIN B12: Vitamin B-12: 305 pg/mL (ref 180–914)

## 2019-07-03 LAB — PHOSPHORUS: Phosphorus: 3.5 mg/dL (ref 2.5–4.6)

## 2019-07-03 LAB — PREALBUMIN: Prealbumin: 11.3 mg/dL — ABNORMAL LOW (ref 18–38)

## 2019-07-03 LAB — PROCALCITONIN: Procalcitonin: 81.21 ng/mL

## 2019-07-03 LAB — FOLATE: Folate: 6.6 ng/mL (ref >5.9)

## 2019-07-03 MED ORDER — SODIUM CHLORIDE 0.9 % IV SOLN
1.0000 g | Freq: Three times a day (TID) | INTRAVENOUS | Status: DC
  Administered 2019-07-03: 1 g via INTRAVENOUS
  Filled 2019-07-03 (×2): qty 1

## 2019-07-03 MED ORDER — SODIUM CHLORIDE 0.9 % IV SOLN
INTRAVENOUS | Status: DC
  Administered 2019-07-03 – 2019-07-04 (×2): via INTRAVENOUS

## 2019-07-03 MED ORDER — HYDROCODONE-ACETAMINOPHEN 5-325 MG PO TABS: 1.0000 | ORAL_TABLET | ORAL | Status: DC | PRN

## 2019-07-03 MED ORDER — SODIUM CHLORIDE 0.9 % IV SOLN
INTRAVENOUS | Status: AC
  Administered 2019-07-03: 04:00:00 via INTRAVENOUS

## 2019-07-03 MED ORDER — ONDANSETRON HCL 4 MG/2ML IJ SOLN: 4.0000 mg | Freq: Four times a day (QID) | INTRAMUSCULAR | Status: DC | PRN

## 2019-07-03 MED ORDER — ONDANSETRON HCL 4 MG PO TABS: 4.0000 mg | ORAL_TABLET | Freq: Four times a day (QID) | ORAL | Status: DC | PRN

## 2019-07-03 MED ORDER — ACETAMINOPHEN 325 MG PO TABS
650.0000 mg | ORAL_TABLET | Freq: Four times a day (QID) | ORAL | Status: DC | PRN
  Administered 2019-07-04 (×2): 650 mg via ORAL
  Filled 2019-07-03 (×2): qty 2

## 2019-07-03 MED ORDER — POTASSIUM CHLORIDE 10 MEQ/100ML IV SOLN
INTRAVENOUS | Status: AC
  Filled 2019-07-03: qty 100

## 2019-07-03 MED ORDER — SODIUM CHLORIDE 0.9 % IV SOLN
1.0000 g | Freq: Two times a day (BID) | INTRAVENOUS | Status: DC
  Administered 2019-07-03 – 2019-07-04 (×2): 1 g via INTRAVENOUS
  Filled 2019-07-03 (×2): qty 1

## 2019-07-03 MED ORDER — CHLORHEXIDINE GLUCONATE CLOTH 2 % EX PADS
6.0000 | MEDICATED_PAD | Freq: Every day | CUTANEOUS | Status: DC
  Administered 2019-07-03 – 2019-07-04 (×2): 6 via TOPICAL

## 2019-07-03 MED ORDER — THIAMINE HCL 100 MG/ML IJ SOLN
100.0000 mg | Freq: Every day | INTRAMUSCULAR | Status: DC
  Administered 2019-07-03 – 2019-07-04 (×2): 100 mg via INTRAVENOUS
  Filled 2019-07-03 (×2): qty 2

## 2019-07-03 MED ORDER — ACETAMINOPHEN 650 MG RE SUPP: 650.0000 mg | Freq: Four times a day (QID) | RECTAL | Status: DC | PRN

## 2019-07-03 NOTE — Progress Notes (Signed)
Pharmacy Antibiotic Note  Shelly Barrett is a 78 y.o. female admitted on 07/02/2019 with Intra-abdominal infection.  Pharmacy has been consulted for meropenem dosing.  Plan: Meropenem 1gm iv q8hr     Temp (24hrs), Avg:99.7 F (37.6 C), Min:99.7 F (37.6 C), Max:99.7 F (37.6 C)  Recent Labs  Lab 07/02/19 2000 07/02/19 2041 07/02/19 2159 07/03/19 0406  WBC  --  15.4*  --  17.3*  CREATININE  --  0.76  --  0.68  LATICACIDVEN 3.7*  --  2.1* 1.7    CrCl cannot be calculated (Unknown ideal weight.).    Allergies  Allergen Reactions  . Betadine [Povidone Iodine]   . Ciprocinonide [Fluocinolone]   . Ciprofloxacin Hcl     Hives  . Flexeril [Cyclobenzaprine]   . Ibuprofen   . Macrobid [Nitrofurantoin Macrocrystal] Diarrhea, Nausea And Vomiting and Swelling  . Naprosyn [Naproxen]   . Penicillins     REACTION: rash  . Shellfish Allergy   . Sodium Benzoate [Nutritional Supplements]   . Iodinated Diagnostic Agents Rash    Antimicrobials this admission: Meropenem 07/02/2019 >>  Dose adjustments this admission: -  Microbiology results: -  Thank you for allowing pharmacy to be a part of this patient's care.  Nani Skillern Crowford 07/03/2019 6:56 AM

## 2019-07-03 NOTE — ED Notes (Signed)
No response to text.  Will have Diplomatic Services operational officer.

## 2019-07-03 NOTE — Plan of Care (Signed)
  Problem: Education: Goal: Knowledge of General Education information will improve Description: Including pain rating scale, medication(s)/side effects and non-pharmacologic comfort measures Outcome: Progressing   Problem: Clinical Measurements: Goal: Will remain free from infection Outcome: Progressing Goal: Diagnostic test results will improve Outcome: Progressing Goal: Respiratory complications will improve Outcome: Progressing Goal: Cardiovascular complication will be avoided Outcome: Progressing   Problem: Coping: Goal: Level of anxiety will decrease Outcome: Progressing   Problem: Elimination: Goal: Will not experience complications related to urinary retention Outcome: Progressing

## 2019-07-03 NOTE — ED Notes (Signed)
Pt had large BM.  Full linen change and pericare.

## 2019-07-03 NOTE — ED Notes (Signed)
Writer attempted to call report, RN states that she is at lunch and to call back with report in 30 mins.

## 2019-07-03 NOTE — Progress Notes (Signed)
PHARMACY - PHYSICIAN COMMUNICATION CRITICAL VALUE ALERT - BLOOD CULTURE IDENTIFICATION (BCID)  Shelly Barrett is an 78 y.o. female who presented to [facility] on 07/02/2019   Assessment: Patient admitted meeting SIRS criteria - concern for acute UTI and SBO.  BCID + 1/4 E.coli, no KPC detected  Name of physician (or Provider) Contacted: K. Schorr  Current antibiotics: Meropenem 1 g IV q12h  Changes to prescribed antibiotics: No changes to antibiotics at this time.    Results for orders placed or performed during the hospital encounter of 07/02/19  Blood Culture ID Panel (Reflexed) (Collected: 07/02/2019  8:42 PM)  Result Value Ref Range   Enterococcus species NOT DETECTED NOT DETECTED   Listeria monocytogenes NOT DETECTED NOT DETECTED   Staphylococcus species NOT DETECTED NOT DETECTED   Staphylococcus aureus (BCID) NOT DETECTED NOT DETECTED   Streptococcus species NOT DETECTED NOT DETECTED   Streptococcus agalactiae NOT DETECTED NOT DETECTED   Streptococcus pneumoniae NOT DETECTED NOT DETECTED   Streptococcus pyogenes NOT DETECTED NOT DETECTED   Acinetobacter baumannii NOT DETECTED NOT DETECTED   Enterobacteriaceae species DETECTED (A) NOT DETECTED   Enterobacter cloacae complex NOT DETECTED NOT DETECTED   Escherichia coli DETECTED (A) NOT DETECTED   Klebsiella oxytoca NOT DETECTED NOT DETECTED   Klebsiella pneumoniae NOT DETECTED NOT DETECTED   Proteus species NOT DETECTED NOT DETECTED   Serratia marcescens NOT DETECTED NOT DETECTED   Carbapenem resistance NOT DETECTED NOT DETECTED   Haemophilus influenzae NOT DETECTED NOT DETECTED   Neisseria meningitidis NOT DETECTED NOT DETECTED   Pseudomonas aeruginosa NOT DETECTED NOT DETECTED   Candida albicans NOT DETECTED NOT DETECTED   Candida glabrata NOT DETECTED NOT DETECTED   Candida krusei NOT DETECTED NOT DETECTED   Candida parapsilosis NOT DETECTED NOT DETECTED   Candida tropicalis NOT DETECTED NOT DETECTED    Lenis Noon, PharmD 07/03/2019  7:09 PM

## 2019-07-03 NOTE — Progress Notes (Signed)
Pharmacy Antibiotic Note  Shelly Barrett is a 78 y.o. female presented to the ED on 07/02/2019 s/p fall and had c/o weakness, urinary frequency and dysuria.  Patient was started on meropenem on admission for suspected urosepsis.  Today, 07/03/2019: - T 99.7, wbc up 17.3 - scr 0.68 (crcl~35) --> weight 38 kg on 04/19/19-  PCT 81.21   Plan: - adjust meropenem to 1gm IV q12h for renal function  _______________________________  Temp (24hrs), Avg:99.7 F (37.6 C), Min:99.7 F (37.6 C), Max:99.7 F (37.6 C)  Recent Labs  Lab 07/02/19 2000 07/02/19 2041 07/02/19 2159 07/03/19 0406  WBC  --  15.4*  --  17.3*  CREATININE  --  0.76  --  0.68  LATICACIDVEN 3.7*  --  2.1* 1.7    CrCl cannot be calculated (Unknown ideal weight.).    Allergies  Allergen Reactions  . Betadine [Povidone Iodine]   . Ciprocinonide [Fluocinolone]   . Ciprofloxacin Hcl     Hives  . Flexeril [Cyclobenzaprine]   . Ibuprofen   . Macrobid [Nitrofurantoin Macrocrystal] Diarrhea, Nausea And Vomiting and Swelling  . Naprosyn [Naproxen]   . Penicillins     REACTION: rash  . Shellfish Allergy   . Sodium Benzoate [Nutritional Supplements]   . Iodinated Diagnostic Agents Rash     Thank you for allowing pharmacy to be a part of this patient's care.  Lynelle Doctor 07/03/2019 8:25 AM

## 2019-07-03 NOTE — ED Notes (Signed)
ED TO INPATIENT HANDOFF REPORT  ED Nurse Name and Phone #: [phone]   S Name/Age/Gender Shelly Barrett 78 y.o. female Room/Bed: WA09/WA09  Code Status   Code Status: DNR  Home/SNF/Other Home Patient oriented to: self, place, time and situation Is this baseline? Yes   Triage Complete: Triage complete  Chief Complaint Fall  Triage Note Per EMS, Pt coming in after experiencing a fall this morning while in the bathroom. No LOC, neck/back pain.Complaining of left shoulder/leg pain, 8/10. Took a xanax this morning for the pain. No neurological changes noted. Hx of compacted bowels.    Allergies Allergies  Allergen Reactions  . Betadine [Povidone Iodine]   . Ciprocinonide [Fluocinolone]   . Ciprofloxacin Hcl     Hives  . Flexeril [Cyclobenzaprine]   . Ibuprofen   . Macrobid [Nitrofurantoin Macrocrystal] Diarrhea, Nausea And Vomiting and Swelling  . Naprosyn [Naproxen]   . Penicillins     REACTION: rash  . Shellfish Allergy   . Sodium Benzoate [Nutritional Supplements]   . Iodinated Diagnostic Agents Rash    Level of Care/Admitting Diagnosis ED Disposition    ED Disposition Condition [facility] Area: [place] [100102]  Level of Care: Stepdown [14]  Admit to SDU based on following criteria: Hemodynamic compromise or significant risk of instability:  Patient requiring short term acute titration and management of vasoactive drips, and invasive monitoring (i.e., CVP and Arterial line).  Covid Evaluation: Confirmed COVID Negative  [facility]: [id number]  Admitting Physician: Toy Baker [[id number]]  Attending Physician: Toy Baker [[id number]]  Estimated length of stay: 3 - 4 days  Certification:: I certify this patient will need inpatient services for at least 2 midnights  PT Class (Do Not Modify): Inpatient [101]  PT Acc Code (Do Not Modify): Private [1]       B Medical/Surgery History Past Medical  History:  Diagnosis Date  . Arthritis   . Hyperlipidemia   . Left femoral hernia without obstruction or gangrene 12/14/2017   On CT 12/13/2017  . Prediabetes   . Scoliosis   . Sigmoid diverticulitis   . Vitamin D deficiency    Past Surgical History:  Procedure Laterality Date  . [place], 2012   implants  . COLONOSCOPY    . TONSILLECTOMY AND ADENOIDECTOMY    . TUBAL LIGATION       A IV Location/Drains/Wounds Patient Lines/Drains/Airways Status   Active Line/Drains/Airways    Name:   Placement date:   Placement time:   Site:   Days:   Peripheral IV 07/02/19 Right Antecubital   07/02/19    2047    Antecubital   1   Peripheral IV 07/02/19 Right Forearm   07/02/19    2033    Forearm   1          Intake/Output Last 24 hours  Intake/Output Summary (Last 24 hours) at 07/03/2019 1423 Last data filed at 07/03/2019 0827 Gross per 24 hour  Intake 3071.7 ml  Output -  Net 3071.7 ml    Labs/Imaging Results for orders placed or performed during the hospital encounter of 07/02/19 (from the past 48 hour(s))  Lactic acid, plasma     Status: Abnormal   Collection Time: 07/02/19  8:00 PM  Result Value Ref Range   Lactic Acid, Venous 3.7 (HH) 0.5 - 1.9 mmol/L    Comment: CRITICAL RESULT CALLED TO, READ BACK BY AND VERIFIED WITH: Mickle Mallory, RN @ [phone] ON  07/02/2019 Sandy Salaam  Performed at [place] [address] [place], [place] [address]   Urinalysis, Routine w reflex microscopic     Status: Abnormal   Collection Time: 07/02/19  8:00 PM  Result Value Ref Range   Color, Urine YELLOW YELLOW   APPearance HAZY (A) CLEAR   Specific Gravity, Urine 1.006 1.005 - 1.030   pH 5.0 5.0 - 8.0   Glucose, UA NEGATIVE NEGATIVE mg/dL   Hgb urine dipstick LARGE (A) NEGATIVE   Bilirubin Urine NEGATIVE NEGATIVE   Ketones, ur 5 (A) NEGATIVE mg/dL   Protein, ur NEGATIVE NEGATIVE mg/dL   Nitrite POSITIVE (A) NEGATIVE   Leukocytes,Ua LARGE (A) NEGATIVE   RBC  / HPF 6-10 0 - 5 RBC/hpf   WBC, UA 21-50 0 - 5 WBC/hpf   Bacteria, UA MANY (A) NONE SEEN    Comment: Performed at [facility], [place] [address] [place], [place]  [address]  SARS Coronavirus 2 [facility] order, Performed in [facility] hospital lab) Nasopharyngeal Nasopharyngeal Swab     Status: None   Collection Time: 07/02/19  8:26 PM   Specimen: Nasopharyngeal Swab  Result Value Ref Range   SARS Coronavirus 2 NEGATIVE NEGATIVE    Comment: (NOTE) If result is NEGATIVE SARS-CoV-2 target nucleic acids are NOT DETECTED. The SARS-CoV-2 RNA is generally detectable in upper and lower  respiratory specimens during the acute phase of infection. The lowest  concentration of SARS-CoV-2 viral copies this assay can detect is 250  copies / mL. A negative result does not preclude SARS-CoV-2 infection  and should not be used as the sole basis for treatment or other  patient management decisions.  A negative result may occur with  improper specimen collection / handling, submission of specimen other  than nasopharyngeal swab, presence of viral mutation(s) within the  areas targeted by this assay, and inadequate number of viral copies  (<250 copies / mL). A negative result must be combined with clinical  observations, patient history, and epidemiological information. If result is POSITIVE SARS-CoV-2 target nucleic acids are DETECTED. The SARS-CoV-2 RNA is generally detectable in upper and lower  respiratory specimens dur ing the acute phase of infection.  Positive  results are indicative of active infection with SARS-CoV-2.  Clinical  correlation with patient history and other diagnostic information is  necessary to determine patient infection status.  Positive results do  not rule out bacterial infection or co-infection with other viruses. If result is PRESUMPTIVE POSTIVE SARS-CoV-2 nucleic acids MAY BE PRESENT.   A presumptive positive result was obtained on the submitted  specimen  and confirmed on repeat testing.  While 2019 novel coronavirus  (SARS-CoV-2) nucleic acids may be present in the submitted sample  additional confirmatory testing may be necessary for epidemiological  and / or clinical management purposes  to differentiate between  SARS-CoV-2 and other Sarbecovirus currently known to infect humans.  If clinically indicated additional testing with an alternate test  methodology [phone]) is advised. The SARS-CoV-2 RNA is generally  detectable in upper and lower respiratory sp ecimens during the acute  phase of infection. The expected result is Negative. Fact Sheet for Patients:  StrictlyIdeas.no Fact Sheet for Healthcare Providers: BankingDealers.co.za This test is not yet approved or cleared by the [place] FDA and has been authorized for detection and/or diagnosis of SARS-CoV-2 by FDA under an Emergency Use Authorization (EUA).  This EUA will remain in effect (meaning this test can be used) for the duration of  the COVID-19 declaration under Section 564(b)(1) of the Act, 21 U.S.C. section 360bbb-3(b)(1), unless the authorization is terminated or revoked sooner. Performed at [facility] [place] [address] [place], [place] [address]   Comprehensive metabolic panel     Status: Abnormal   Collection Time: 07/02/19  8:41 PM  Result Value Ref Range   Sodium 138 135 - 145 mmol/L   Potassium 2.7 (LL) 3.5 - 5.1 mmol/L    Comment: CRITICAL RESULT CALLED TO, READ BACK BY AND VERIFIED WITH: Bridgett Larsson, RN @ 2158 ON 07/02/2019 C VARNER    Chloride 102 98 - 111 mmol/L   CO2 23 22 - 32 mmol/L   Glucose, Bld 135 (H) 70 - 99 mg/dL   BUN 19 8 - 23 mg/dL   Creatinine, Ser 0.76 0.44 - 1.00 mg/dL   Calcium 9.0 8.9 - 10.3 mg/dL   Total Protein 6.4 (L) 6.5 - 8.1 g/dL   Albumin 4.0 3.5 - 5.0 g/dL   AST 103 (H) 15 - 41 U/L   ALT 45 (H) 0 - 44 U/L   Alkaline Phosphatase 99 38 - 126 U/L    Total Bilirubin 0.7 0.3 - 1.2 mg/dL   GFR calc non Af Amer >60 >60 mL/min   GFR calc Af Amer >60 >60 mL/min   Anion gap 13 5 - 15    Comment: Performed at [facility], [place] [address], [place], [place] [address]  CBC WITH DIFFERENTIAL     Status: Abnormal   Collection Time: 07/02/19  8:41 PM  Result Value Ref Range   WBC 15.4 (H) 4.0 - 10.5 K/uL   RBC 3.89 3.87 - 5.11 MIL/uL   Hemoglobin 11.7 (L) 12.0 - 15.0 g/dL   HCT 35.6 (L) 36.0 - 46.0 %   MCV 91.5 80.0 - 100.0 fL   MCH 30.1 26.0 - 34.0 pg   MCHC 32.9 30.0 - 36.0 g/dL   RDW 14.5 11.5 - 15.5 %   Platelets 141 (L) 150 - 400 K/uL   nRBC 0.0 0.0 - 0.2 %   Neutrophils Relative % 93 %   Neutro Abs 14.2 (H) 1.7 - 7.7 K/uL   Lymphocytes Relative 2 %   Lymphs Abs 0.4 (L) 0.7 - 4.0 K/uL   Monocytes Relative 4 %   Monocytes Absolute 0.6 0.1 - 1.0 K/uL   Eosinophils Relative 0 %   Eosinophils Absolute 0.0 0.0 - 0.5 K/uL   Basophils Relative 0 %   Basophils Absolute 0.0 0.0 - 0.1 K/uL   WBC Morphology MILD LEFT SHIFT (1-5% METAS, OCC MYELO, OCC BANDS)     Comment: VACUOLATED NEUTROPHILS   Immature Granulocytes 1 %   Abs Immature Granulocytes 0.19 (H) 0.00 - 0.07 K/uL    Comment: Performed at [facility], [place] [address] [place], [place] [address]  APTT     Status: None   Collection Time: 07/02/19  8:41 PM  Result Value Ref Range   aPTT 34 24 - 36 seconds    Comment: Performed at [facility], [place] [address] [place], [place] [address]  Protime-INR     Status: None   Collection Time: 07/02/19  8:41 PM  Result Value Ref Range   Prothrombin Time 15.1 11.4 - 15.2 seconds   INR 1.2 0.8 - 1.2    Comment: (NOTE) INR goal varies based on device and disease states. Performed at [facility], [place] [address], [place],  [address]  CK     Status: Abnormal   Collection Time: 07/02/19  8:41 PM  Result Value Ref Range   Total CK 3,176 (H) 38 - 234 U/L     Comment: Performed at [facility], [place] [address], [place], [place] [address]  Magnesium     Status: None   Collection Time: 07/02/19  8:41 PM  Result Value Ref Range   Magnesium 2.1 1.7 - 2.4 mg/dL    Comment: Performed at [facility], [place] [address] [place], [place] [address]  Phosphorus     Status: None   Collection Time: 07/02/19  8:41 PM  Result Value Ref Range   Phosphorus 2.5 2.5 - 4.6 mg/dL    Comment: Performed at [facility], [place] [address] [place], [place] [address]  Blood Culture (routine x 2)     Status: None (Preliminary result)   Collection Time: 07/02/19  8:42 PM   Specimen: BLOOD RIGHT FOREARM  Result Value Ref Range   Specimen Description      BLOOD RIGHT FOREARM Performed at [facility], [place] [address], [place], [place] [address]    Special Requests      BOTTLES DRAWN AEROBIC AND ANAEROBIC Blood Culture results may not be optimal due to an inadequate volume of blood received in culture bottles Performed at [facility], [place] [address] [place], [place] [address]    Culture      NO GROWTH < 12 HOURS Performed at [place] [address] [place], [place] [address]    Report Status PENDING   Blood Culture (routine x 2)     Status: None (Preliminary result)   Collection Time: 07/02/19  8:52 PM   Specimen: BLOOD  Result Value Ref Range   Specimen Description      BLOOD RIGHT ANTECUBITAL Performed at [place] [address] [place], [place] [address]    Special Requests      BOTTLES DRAWN AEROBIC AND ANAEROBIC Blood Culture results may not be optimal due to an inadequate volume of blood received in culture bottles Performed at [facility], [place] [address] [place], [place] [address]    Culture      NO GROWTH < 12 HOURS Performed at [place] [address] [place], [place] [address]    Report Status PENDING    Lactic acid, plasma     Status: Abnormal   Collection Time: 07/02/19  9:59 PM  Result Value Ref Range   Lactic Acid, Venous 2.1 (HH) 0.5 - 1.9 mmol/L    Comment: CRITICAL VALUE NOTED.  VALUE IS CONSISTENT WITH PREVIOUSLY REPORTED AND CALLED VALUE. Performed at [facility], [place] [address] [place], [place] [address]   Troponin I (High Sensitivity)     Status: Abnormal   Collection Time: 07/03/19 12:17 AM  Result Value Ref Range   Troponin I (High Sensitivity) 776 (HH) <18 ng/L    Comment: CRITICAL RESULT CALLED TO, READ BACK BY AND VERIFIED WITH: ALAINA, RN @ 0515 ON 07/03/2019 C VARNER (NOTE) Elevated high sensitivity troponin I (hsTnI) values and significant  changes across serial measurements may suggest ACS but many other  chronic and acute conditions are known to elevate hsTnI results.  Refer to the Links section for chest pain algorithms and additional  guidance. Performed at [facility], [place] [address] [place],  [address]   Prealbumin  Status: Abnormal   Collection Time: 07/03/19  4:06 AM  Result Value Ref Range   Prealbumin 11.3 (L) 18 - 38 mg/dL    Comment: Performed at [facility], [place] [address], [place], [place] [address]  CK     Status: Abnormal   Collection Time: 07/03/19  4:06 AM  Result Value Ref Range   Total CK 5,242 (H) 38 - 234 U/L    Comment: RESULTS CONFIRMED BY MANUAL DILUTION Performed at [facility], [place] [address], [place], [place] [address]   Lactic acid, plasma     Status: None   Collection Time: 07/03/19  4:06 AM  Result Value Ref Range   Lactic Acid, Venous 1.7 0.5 - 1.9 mmol/L    Comment: Performed at [facility], [place] [address], [place], [place] [address]  Procalcitonin     Status: None   Collection Time: 07/03/19  4:06 AM  Result Value Ref Range   Procalcitonin 81.21 ng/mL    Comment:        Interpretation: PCT >= 10  ng/mL: Important systemic inflammatory response, almost exclusively due to severe bacterial sepsis or septic shock. (NOTE)       Sepsis PCT Algorithm           Lower Respiratory Tract                                      Infection PCT Algorithm    ----------------------------     ----------------------------         PCT < 0.25 ng/mL                PCT < 0.10 ng/mL         Strongly encourage             Strongly discourage   discontinuation of antibiotics    initiation of antibiotics    ----------------------------     -----------------------------       PCT 0.25 - 0.50 ng/mL            PCT 0.10 - 0.25 ng/mL               OR       >80% decrease in PCT            Discourage initiation of                                            antibiotics      Encourage discontinuation           of antibiotics    ----------------------------     -----------------------------         PCT >= 0.50 ng/mL              PCT 0.26 - 0.50 ng/mL                AND       <80% decrease in PCT             Encourage initiation of  antibiotics       Encourage continuation           of antibiotics    ----------------------------     -----------------------------        PCT >= 0.50 ng/mL                  PCT > 0.50 ng/mL               AND         increase in PCT                  Strongly encourage                                      initiation of antibiotics    Strongly encourage escalation           of antibiotics                                     -----------------------------                                           PCT <= 0.25 ng/mL                                                 OR                                        > 80% decrease in PCT                                     Discontinue / Do not initiate                                             antibiotics Performed at [place] [address], [place], [place] [address]    Protime-INR     Status: Abnormal   Collection Time: 07/03/19  4:06 AM  Result Value Ref Range   Prothrombin Time 17.3 (H) 11.4 - 15.2 seconds   INR 1.4 (H) 0.8 - 1.2    Comment: (NOTE) INR goal varies based on device and disease states. Performed at [facility], [place] [address] [place], [place] [address]   APTT     Status: Abnormal   Collection Time: 07/03/19  4:06 AM  Result Value Ref Range   aPTT 43 (H) 24 - 36 seconds    Comment:        IF BASELINE aPTT IS ELEVATED, SUGGEST PATIENT RISK ASSESSMENT BE USED TO DETERMINE APPROPRIATE ANTICOAGULANT THERAPY. Performed at [facility], [place] [address], [place],  [address]   Vitamin B12     Status: None   Collection Time: 07/03/19  4:06 AM  Result Value  Ref Range   Vitamin B-12 305 180 - 914 pg/mL    Comment: (NOTE) This assay is not validated for testing neonatal or myeloproliferative syndrome specimens for Vitamin B12 levels. Performed at [facility], [place] [address], [place], [place] [address]   Folate     Status: None   Collection Time: 07/03/19  4:06 AM  Result Value Ref Range   Folate 6.6 >5.9 ng/mL    Comment: Performed at [facility], [place] [address] [place], [place] [address]  Iron and TIBC     Status: Abnormal   Collection Time: 07/03/19  4:06 AM  Result Value Ref Range   Iron 9 (L) 28 - 170 ug/dL   TIBC 311 250 - 450 ug/dL   Saturation Ratios 3 (L) 10.4 - 31.8 %   UIBC 302 ug/dL    Comment: Performed at [facility], [place] [address] [place], [place] [address]  Ferritin     Status: None   Collection Time: 07/03/19  4:06 AM  Result Value Ref Range   Ferritin 140 11 - 307 ng/mL    Comment: Performed at [facility], [place] [address] [place], [place] [address]  Reticulocytes     Status: Abnormal   Collection Time: 07/03/19  4:06 AM  Result Value Ref Range   Retic Ct Pct 0.8 0.4 - 3.1 %    RBC. 3.63 (L) 3.87 - 5.11 MIL/uL   Retic Count, Absolute 28.7 19.0 - 186.0 K/uL   Immature Retic Fract 8.4 2.3 - 15.9 %    Comment: Performed at [facility], [place] [address] [place], [place] [address]  Magnesium     Status: None   Collection Time: 07/03/19  4:06 AM  Result Value Ref Range   Magnesium 1.9 1.7 - 2.4 mg/dL    Comment: Performed at [facility], [place] [address] [place], [place] [address]  Phosphorus     Status: None   Collection Time: 07/03/19  4:06 AM  Result Value Ref Range   Phosphorus 3.5 2.5 - 4.6 mg/dL    Comment: Performed at [facility], [place] [address] [place], [place] [address]  TSH     Status: None   Collection Time: 07/03/19  4:06 AM  Result Value Ref Range   TSH 0.932 0.350 - 4.500 uIU/mL    Comment: Performed by a 3rd Generation assay with a functional sensitivity of <=0.01 uIU/mL. Performed at [facility], [place] [address], [place], [place] [address]   Comprehensive metabolic panel     Status: Abnormal   Collection Time: 07/03/19  4:06 AM  Result Value Ref Range   Sodium 141 135 - 145 mmol/L   Potassium 3.1 (L) 3.5 - 5.1 mmol/L   Chloride 110 98 - 111 mmol/L   CO2 22 22 - 32 mmol/L   Glucose, Bld 95 70 - 99 mg/dL   BUN 16 8 - 23 mg/dL   Creatinine, Ser 0.68 0.44 - 1.00 mg/dL   Calcium 8.2 (L) 8.9 - 10.3 mg/dL   Total Protein 5.8 (L) 6.5 - 8.1 g/dL   Albumin 3.3 (L) 3.5 - 5.0 g/dL   AST 126 (H) 15 - 41 U/L   ALT 47 (H) 0 - 44 U/L   Alkaline Phosphatase 86 38 - 126 U/L   Total Bilirubin 0.4 0.3 - 1.2 mg/dL   GFR calc non Af Amer >60 >60 mL/min   GFR calc Af Amer >  60 >60 mL/min   Anion gap 9 5 - 15    Comment: Performed at [facility], [place] [address], [place], [place] [address]  CBC     Status: Abnormal   Collection Time: 07/03/19  4:06 AM  Result Value Ref Range   WBC 17.3 (H) 4.0 - 10.5 K/uL   RBC 3.63 (L) 3.87 - 5.11 MIL/uL   Hemoglobin 10.9 (L)  12.0 - 15.0 g/dL   HCT 33.3 (L) 36.0 - 46.0 %   MCV 91.7 80.0 - 100.0 fL   MCH 30.0 26.0 - 34.0 pg   MCHC 32.7 30.0 - 36.0 g/dL   RDW 14.5 11.5 - 15.5 %   Platelets 142 (L) 150 - 400 K/uL   nRBC 0.0 0.0 - 0.2 %    Comment: Performed at [facility], [place] [address], [place], [place] [address]  Lactic acid, plasma     Status: Abnormal   Collection Time: 07/03/19  8:51 AM  Result Value Ref Range   Lactic Acid, Venous 2.3 (HH) 0.5 - 1.9 mmol/L    Comment: CRITICAL VALUE NOTED.  VALUE IS CONSISTENT WITH PREVIOUSLY REPORTED AND CALLED VALUE. Performed at [facility], [place] [address] [place], [place] [address]   Troponin I (High Sensitivity)     Status: Abnormal   Collection Time: 07/03/19  8:51 AM  Result Value Ref Range   Troponin I (High Sensitivity) 665 (HH) <18 ng/L    Comment: DELTA CHECK NOTED CRITICAL VALUE NOTED.  VALUE IS CONSISTENT WITH PREVIOUSLY REPORTED AND CALLED VALUE. (NOTE) Elevated high sensitivity troponin I (hsTnI) values and significant  changes across serial measurements may suggest ACS but many other  chronic and acute conditions are known to elevate hsTnI results.  Refer to the Links section for chest pain algorithms and additional  guidance. Performed at [facility], [place] [address], [place], [place] [address]   Magnesium     Status: None   Collection Time: 07/03/19  8:51 AM  Result Value Ref Range   Magnesium 1.9 1.7 - 2.4 mg/dL    Comment: Performed at [facility], [place] [address] [place], [place] [address]  Troponin I (High Sensitivity)     Status: Abnormal   Collection Time: 07/03/19  1:13 PM  Result Value Ref Range   Troponin I (High Sensitivity) 512 (HH) <18 ng/L    Comment: CRITICAL VALUE NOTED.  VALUE IS CONSISTENT WITH PREVIOUSLY REPORTED AND CALLED VALUE. (NOTE) Elevated high sensitivity troponin I (hsTnI) values and significant  changes across serial measurements may  suggest ACS but many other  chronic and acute conditions are known to elevate hsTnI results.  Refer to the Links section for chest pain algorithms and additional  guidance. Performed at [facility], [place] [address], [place], [place] [address]    Dg Abd 1 View  Result Date: 07/03/2019 CLINICAL DATA:  Ileus. EXAM: ABDOMEN - 1 VIEW COMPARISON:  07/02/2019. FINDINGS: Soft tissue structures are unremarkable. Air-filled loops of small and large bowel noted. Small-bowel distention has improved from prior exam. No free air identified. Lumbar spine scoliosis concave right. Diffuse osteopenia and degenerative change. IMPRESSION: Nondistended air-filled loops of small and large bowel noted. Small-bowel distention has improved from prior exam. Findings suggest adynamic ileus. To demonstrate resolution and to exclude bowel obstruction continued follow-up exam suggested. Electronically Signed   By: Hooker   On: 07/03/2019 10:43   Dg Abd 1 View  Result  Date: 07/02/2019 CLINICAL DATA:  Obstipation. EXAM: ABDOMEN - 1 VIEW COMPARISON:  CT 12/13/2017 FINDINGS: No evidence of free air on single-view. Mildly dilated small bowel in the pelvis and left lower abdomen measuring 3 cm. No significant formed stool in the colon. No radiopaque calculi or abnormal soft tissue calcifications. Scoliotic curvature in degenerative change of the spine. The bones are under mineralized. Included lung bases are clear. IMPRESSION: Mildly dilated small bowel in the pelvis and left lower abdomen, ileus versus early small bowel obstruction. Electronically Signed   By: Keith Rake M.D.   On: 07/02/2019 23:48   Dg Chest Port 1 View  Result Date: 07/02/2019 CLINICAL DATA:  Shortness of breath today. Status post fall this morning. EXAM: PORTABLE CHEST 1 VIEW COMPARISON:  None. FINDINGS: Lungs are clear. Heart size is normal. No pneumothorax or pleural effusion. Scoliosis noted. No acute bony abnormality.  IMPRESSION: No acute disease. Electronically Signed   By: Inge Rise M.D.   On: 07/02/2019 20:39    Pending Labs Unresulted Labs (From admission, onward)    Start     Ordered   07/04/19 0500  CBC  Tomorrow morning,   R     07/03/19 1253   07/04/19 [id number]  Basic metabolic panel  Tomorrow morning,   R     07/03/19 1253   07/03/19 1253  CK  Once,   STAT     07/03/19 1252   07/02/19 1959  Urine culture  ONCE - STAT,   STAT     07/02/19 2000   Unscheduled  Occult blood card to lab, stool RN will collect  As needed,   R    Question:  Specimen to be collected by:  Answer:  RN will collect   07/03/19 0349          Vitals/Pain Today's Vitals   07/03/19 1000 07/03/19 1030 07/03/19 1309 07/03/19 1310  BP: (!) 104/57 90/74  (!) 98/54  Pulse: 98 100  91  Resp: (!) 22 18  18   Temp:      TempSrc:      SpO2: 96% 98%  97%  Weight:   37.6 kg   Height:   5\' 3"  (1.6 m)   PainSc:        Isolation Precautions No active isolations  Medications Medications  acetaminophen (TYLENOL) tablet 650 mg (has no administration in time range)    Or  acetaminophen (TYLENOL) suppository 650 mg (has no administration in time range)  HYDROcodone-acetaminophen (NORCO/VICODIN) 5-325 MG per tablet 1-2 tablet (has no administration in time range)  ondansetron (ZOFRAN) tablet 4 mg (has no administration in time range)    Or  ondansetron (ZOFRAN) injection 4 mg (has no administration in time range)  0.9 %  sodium chloride infusion ( Intravenous Rate/Dose Change 07/03/19 0849)  thiamine (B-1) injection 100 mg (100 mg Intravenous Given 07/03/19 1024)  0.9 %  sodium chloride infusion ( Intravenous Not Given 07/03/19 0410)  meropenem (MERREM) 1 g in sodium chloride 0.9 % 100 mL IVPB (has no administration in time range)  sodium chloride 0.9 % bolus 1,000 mL (0 mLs Intravenous Stopped 07/03/19 0319)  acetaminophen (TYLENOL) tablet 650 mg (650 mg Oral Given 07/02/19 2123)  potassium chloride 10 mEq in 100 mL IVPB  (0 mEq Intravenous Stopped 07/02/19 2343)  sodium chloride 0.9 % bolus 1,000 mL (0 mLs Intravenous Stopped 07/02/19 2259)  meropenem (MERREM) 1 g in sodium chloride 0.9 % 100 mL IVPB (0 g Intravenous Stopped 07/02/19  2345)  sodium chloride 0.9 % bolus 500 mL (0 mLs Intravenous Stopped 07/03/19 0510)  potassium chloride 10 mEq in 100 mL IVPB ( Intravenous Stopped 07/03/19 0650)    Mobility walks with device Moderate fall risk   Focused Assessments    R Recommendations: See Admitting Provider Note  Report given to:   Additional Notes:

## 2019-07-03 NOTE — ED Notes (Signed)
Pt's Son: [name] [phone]

## 2019-07-03 NOTE — ED Notes (Signed)
Hospitalist at Bedside 

## 2019-07-03 NOTE — ED Notes (Signed)
Date and time results received: 07/03/19 5:16 AM  Test: Troponin Critical Value: 776  Name of Provider Notified: Paging Hospitalist  Orders Received? Or Actions Taken?: Awaiting further orders

## 2019-07-03 NOTE — ED Notes (Signed)
Debbie NS attempted to page Hospitalist several times w/o response.  She spoke to Dr Jonelle Sidle as he was in the department.  He told Debbie to have this RN "text" him regarding criticals.  Text sent.

## 2019-07-03 NOTE — Progress Notes (Signed)
[facility]  PROGRESS NOTE  Shelly Barrett [id number] DOB: [birth year] DOA: 07/02/2019 PCP: [name], MD  Brief History    Shelly Barrett is a 78 y.o. year old female with medical history significant for HTN, HLD, prediabetes, vitamin D deficiency, moderately severe thoracolumbar scoliosis who presented on 07/02/2019 after a fall.  Patient states she was seen by her PCP recently and was encouraged to increase the amount of fat in her diet.  She states after eating a meal mashed potatoes she developed worsening belly pain with nausea and inability to tolerate oral intake.  She felt as if she was constipated and manually self disimpacted herself while on the commode.  That resulted in a large amount of liquid stool which she says caused her to fall off her commode onto the bathroom floor.  She lives with her husband who was unable to lift her from the ground so they had to call her son who then took her to the ED.  ED course: T-max 99.7 respiratory rate 27, SPO2 96% on room air, BP 87/61  Lactic acid 3.7, then 2.1, then 2.3 Troponin VII 76, then 665 Prealbumin 11.3, CK 3176 then 5242 Pro-Cal 81.21 INR 1.4 Vitamin B12 305, folate 6.6 Iron 9, saturation ratio is 3, ferritin 140  K3.1, creatinine 0.68 WBC 17.3, hemoglobin 10.9, platelet 1242  Blood cultures x2 were obtained COVID test negative UA with positive nitrites, large leukocytes, many bacteria  Abdominal x-ray: Mildly dilated small bowel, ileus versus early small bowel obstruction Chest x-ray no active disease  Patient was given normal saline bolus, IV fluids for maintenance, IV meropenem, IV potassium.  [facility] was called for further management and admission  A & P     Worsening belly pain, nausea vomiting secondary to ileus/early SBO, improving.  Repeat x-ray shows ileus with no signs of current obstruction improved from Prior abd xr.  Patient has improvement in belly pain, had a large BM on  exam.  Will now allow clear liquid diet, advance diet slowly,  IV fluids, PRN antiemetics, optimize bowel regimen, repeat XR in am to check for progression   Rhabdomyolysis after being found down for unknown period of time, worsening.  CK is increased from 3000 to over 5000, will still need to continue IV fluids at increased rate of 150 cc/h to avoid kidney dysfunction, will repeat CK this afternoon.   Elevated high-sensitivity troponin, improving.  Peak of 776, has now down trended to 665.  EKG nonischemic, patient without chest pain.  Likely elevated troponin in the setting of elevated CK.  Continue to closely monitor.   Sirs criteria (leukocytosis, severe hypotension hypotension with lactic acidosis) likely related to dehydration in the setting of developing SBO/ileus.  Patient admits to diminished oral intake for quite some time and abdominal imaging/chest x-ray negative for acute infection..  Blood pressure responding to fluids with SBP's in the 90s on maintenance fluid.  Procalcitonin quite elevated at 88.  Monitor blood cultures, continue empiric antibiotics.   Hypotension with lactic acidosis, improving.  SBP improving on maintenance fluids.  Lactic acid has resolved.  Closely monitor minimal oral intake, while monitoring for resolution of ileus.   Hypokalemia.  Likely due to not intractable nausea vomiting prior to admission.  Replete, repeat BMP in a.m.   Acute UTI.  UA positive for infection, reports urinary frequency/malodorous urine.  Doubt this is causing about Sirs criteria.  We will continue empiric IV meropenem for now, monitor blood cultures, monitor urine  cultures, anticipate being able to de-escalate in next 24 to 48 hours.   Diminished appetite/malnutrition of moderate degree.  Consult nutrition.  Prealbumin 11.  Continue supplemental thiamine   Chronic normocytic anemia.  Iron panel consistent with iron deficiency anemia normal lower range.  No active signs of bleeding.   Hemoglobin stable at baseline.  May need to start oral iron therapy as outpatient, will hold off for now as it could contribute to constipation.     DVT prophylaxis: SCDs Code Status: DNR, discussed on day of admission Family Communication: We will update son Disposition Plan: Needs continued inpatient stay given close monitoring of blood pressure, IV antibiotics for UTI, resolution of rhabdomyolysis, ensure resolution of ileus with no progression to early SBO.    [facility] Direct contact: see [email or web address] (further directions at bottom of note if needed) 7PM-7AM contact night coverage as at bottom of note 07/03/2019, 12:16 PM  LOS: 1 day   Consultants   None  Procedures   None  Antibiotics   None  Interval History/Subjective  Still having belly pain but seems like it is improved No vomiting in the hospital Minimal nausea   Objective   Vitals:  Vitals:   07/03/19 1000 07/03/19 1030  BP: (!) 104/57 90/74  Pulse: 98 100  Resp: (!) 22 18  Temp:    SpO2: 96% 98%    Exam:  Elderly female, lying in bed awake Alert, Oriented X 3, No new F.N deficits, Normal affect [place].AT, Symmetrical Chest wall movement, Good air movement bilaterally, CTAB Tachycardic,No Gallops,Rubs or new Murmurs, no peripheral edema +ve B.Sounds, Abd Soft, mild tenderness without rebound - guarding or rigidity. No Cyanosis, Clubbing or edema, No new Rash or bruise     I have personally reviewed the following:   Data Reviewed: Basic Metabolic Panel: Recent Labs  Lab 07/02/19 2041 07/03/19 0406 07/03/19 0851  NA 138 141  --   K 2.7* 3.1*  --   CL 102 110  --   CO2 23 22  --   GLUCOSE 135* 95  --   BUN 19 16  --   CREATININE 0.76 0.68  --   CALCIUM 9.0 8.2*  --   MG 2.1 1.9 1.9  PHOS 2.5 3.5  --    Liver Function Tests: Recent Labs  Lab 07/02/19 2041 07/03/19 0406  AST 103* 126*  ALT 45* 47*  ALKPHOS 99 86  BILITOT 0.7 0.4  PROT 6.4* 5.8*  ALBUMIN 4.0 3.3*   No  results for input(s): LIPASE, AMYLASE in the last 168 hours. No results for input(s): AMMONIA in the last 168 hours. CBC: Recent Labs  Lab 07/02/19 2041 07/03/19 0406  WBC 15.4* 17.3*  NEUTROABS 14.2*  --   HGB 11.7* 10.9*  HCT 35.6* 33.3*  MCV 91.5 91.7  PLT 141* 142*   Cardiac Enzymes: Recent Labs  Lab 07/02/19 2041 07/03/19 0406  CKTOTAL 3,176* 5,242*   BNP (last 3 results) No results for input(s): BNP in the last 8760 hours.  ProBNP (last 3 results) No results for input(s): PROBNP in the last 8760 hours.  CBG: No results for input(s): GLUCAP in the last 168 hours.  Recent Results (from the past 240 hour(s))  SARS Coronavirus 2 [facility] order, Performed in [facility] hospital lab) Nasopharyngeal Nasopharyngeal Swab     Status: None   Collection Time: 07/02/19  8:26 PM   Specimen: Nasopharyngeal Swab  Result Value Ref Range Status   SARS Coronavirus 2 NEGATIVE NEGATIVE  Final    Comment: (NOTE) If result is NEGATIVE SARS-CoV-2 target nucleic acids are NOT DETECTED. The SARS-CoV-2 RNA is generally detectable in upper and lower  respiratory specimens during the acute phase of infection. The lowest  concentration of SARS-CoV-2 viral copies this assay can detect is 250  copies / mL. A negative result does not preclude SARS-CoV-2 infection  and should not be used as the sole basis for treatment or other  patient management decisions.  A negative result may occur with  improper specimen collection / handling, submission of specimen other  than nasopharyngeal swab, presence of viral mutation(s) within the  areas targeted by this assay, and inadequate number of viral copies  (<250 copies / mL). A negative result must be combined with clinical  observations, patient history, and epidemiological information. If result is POSITIVE SARS-CoV-2 target nucleic acids are DETECTED. The SARS-CoV-2 RNA is generally detectable in upper and lower  respiratory specimens dur ing the  acute phase of infection.  Positive  results are indicative of active infection with SARS-CoV-2.  Clinical  correlation with patient history and other diagnostic information is  necessary to determine patient infection status.  Positive results do  not rule out bacterial infection or co-infection with other viruses. If result is PRESUMPTIVE POSTIVE SARS-CoV-2 nucleic acids MAY BE PRESENT.   A presumptive positive result was obtained on the submitted specimen  and confirmed on repeat testing.  While 2019 novel coronavirus  (SARS-CoV-2) nucleic acids may be present in the submitted sample  additional confirmatory testing may be necessary for epidemiological  and / or clinical management purposes  to differentiate between  SARS-CoV-2 and other Sarbecovirus currently known to infect humans.  If clinically indicated additional testing with an alternate test  methodology [phone]) is advised. The SARS-CoV-2 RNA is generally  detectable in upper and lower respiratory sp ecimens during the acute  phase of infection. The expected result is Negative. Fact Sheet for Patients:  StrictlyIdeas.no Fact Sheet for Healthcare Providers: BankingDealers.co.za This test is not yet approved or cleared by the [place] FDA and has been authorized for detection and/or diagnosis of SARS-CoV-2 by FDA under an Emergency Use Authorization (EUA).  This EUA will remain in effect (meaning this test can be used) for the duration of the COVID-19 declaration under Section 564(b)(1) of the Act, 21 U.S.C. section 360bbb-3(b)(1), unless the authorization is terminated or revoked sooner. Performed at [facility], [place] [address], [place], [place] [address]   Blood Culture (routine x 2)     Status: None (Preliminary result)   Collection Time: 07/02/19  8:42 PM   Specimen: BLOOD RIGHT FOREARM  Result Value Ref Range Status   Specimen Description    Final    BLOOD RIGHT FOREARM Performed at [facility], [place] [address] [place], [place] [address]    Special Requests   Final    BOTTLES DRAWN AEROBIC AND ANAEROBIC Blood Culture results may not be optimal due to an inadequate volume of blood received in culture bottles Performed at [place] [address] [place], [place] [address]    Culture   Final    NO GROWTH < 12 HOURS Performed at [place] [address], [place], [place] [address]    Report Status PENDING  Incomplete  Blood Culture (routine x 2)     Status: None (Preliminary result)   Collection Time: 07/02/19  8:52 PM   Specimen: BLOOD  Result Value Ref  Range Status   Specimen Description   Final    BLOOD RIGHT ANTECUBITAL Performed at [place] [address], [place], [place] [address]    Special Requests   Final    BOTTLES DRAWN AEROBIC AND ANAEROBIC Blood Culture results may not be optimal due to an inadequate volume of blood received in culture bottles Performed at [place] [address], [place], [place] [address]    Culture   Final    NO GROWTH < 12 HOURS Performed at [place] [address] [place], [place] [address]    Report Status PENDING  Incomplete     Studies: Dg Abd 1 View  Result Date: 07/03/2019 CLINICAL DATA:  Ileus. EXAM: ABDOMEN - 1 VIEW COMPARISON:  07/02/2019. FINDINGS: Soft tissue structures are unremarkable. Air-filled loops of small and large bowel noted. Small-bowel distention has improved from prior exam. No free air identified. Lumbar spine scoliosis concave right. Diffuse osteopenia and degenerative change. IMPRESSION: Nondistended air-filled loops of small and large bowel noted. Small-bowel distention has improved from prior exam. Findings suggest adynamic ileus. To demonstrate resolution and to exclude bowel obstruction continued follow-up exam suggested. Electronically Signed   By: Marcello Moores   Register   On: 07/03/2019 10:43   Dg Abd 1 View  Result Date: 07/02/2019 CLINICAL DATA:  Obstipation. EXAM: ABDOMEN - 1 VIEW COMPARISON:  CT 12/13/2017 FINDINGS: No evidence of free air on single-view. Mildly dilated small bowel in the pelvis and left lower abdomen measuring 3 cm. No significant formed stool in the colon. No radiopaque calculi or abnormal soft tissue calcifications. Scoliotic curvature in degenerative change of the spine. The bones are under mineralized. Included lung bases are clear. IMPRESSION: Mildly dilated small bowel in the pelvis and left lower abdomen, ileus versus early small bowel obstruction. Electronically Signed   By: Keith Rake M.D.   On: 07/02/2019 23:48   Dg Chest Port 1 View  Result Date: 07/02/2019 CLINICAL DATA:  Shortness of breath today. Status post fall this morning. EXAM: PORTABLE CHEST 1 VIEW COMPARISON:  None. FINDINGS: Lungs are clear. Heart size is normal. No pneumothorax or pleural effusion. Scoliosis noted. No acute bony abnormality. IMPRESSION: No acute disease. Electronically Signed   By: Inge Rise M.D.   On: 07/02/2019 20:39    Scheduled Meds:  thiamine injection  100 mg Intravenous Daily   Continuous Infusions:  sodium chloride 150 mL/hr at 07/03/19 0849   sodium chloride     meropenem (MERREM) IV      Active Problems:   Hypertension   Hyperlipidemia   Malnutrition of mild degree (HCC)   Sepsis (HCC)   Ileus (HCC)   Hypokalemia   Rhabdomyolysis   Dehydration   Anemia   Hypotension   Elevated troponin   Acute lower UTI      Desiree Hane  [facility]

## 2019-07-03 NOTE — ED Notes (Signed)
Spoke to Dr. Lonny Prude regarding previous critical labs.

## 2019-07-04 DIAGNOSIS — R7881 Bacteremia: Secondary | ICD-10-CM

## 2019-07-04 DIAGNOSIS — L89302 Pressure ulcer of unspecified buttock, stage 2: Secondary | ICD-10-CM

## 2019-07-04 DIAGNOSIS — A4151 Sepsis due to Escherichia coli [E. coli]: Principal | ICD-10-CM

## 2019-07-04 LAB — CBC
HCT: 33 % — ABNORMAL LOW (ref 36.0–46.0)
Hemoglobin: 10.6 g/dL — ABNORMAL LOW (ref 12.0–15.0)
MCH: 29.9 pg (ref 26.0–34.0)
MCHC: 32.1 g/dL (ref 30.0–36.0)
MCV: 93.2 fL (ref 80.0–100.0)
Platelets: 137 10*3/uL — ABNORMAL LOW (ref 150–400)
RBC: 3.54 MIL/uL — ABNORMAL LOW (ref 3.87–5.11)
RDW: 14.8 % (ref 11.5–15.5)
WBC: 14.4 10*3/uL — ABNORMAL HIGH (ref 4.0–10.5)
nRBC: 0 % (ref 0.0–0.2)

## 2019-07-04 LAB — BASIC METABOLIC PANEL
Anion gap: 6 (ref 5–15)
BUN: 10 mg/dL (ref 8–23)
CO2: 22 mmol/L (ref 22–32)
Calcium: 8.2 mg/dL — ABNORMAL LOW (ref 8.9–10.3)
Chloride: 114 mmol/L — ABNORMAL HIGH (ref 98–111)
Creatinine, Ser: 0.43 mg/dL — ABNORMAL LOW (ref 0.44–1.00)
GFR calc Af Amer: >60 mL/min (ref >60)
GFR calc non Af Amer: >60 mL/min (ref >60)
Glucose, Bld: 87 mg/dL (ref 70–99)
Potassium: 3.5 mmol/L (ref 3.5–5.1)
Sodium: 142 mmol/L (ref 135–145)

## 2019-07-04 MED ORDER — SODIUM CHLORIDE 0.9 % IV SOLN
2.0000 g | INTRAVENOUS | Status: DC
  Administered 2019-07-04: 16:00:00 2 g via INTRAVENOUS
  Filled 2019-07-04: qty 20

## 2019-07-04 MED ORDER — DEXTROSE IN LACTATED RINGERS 5 % IV SOLN
INTRAVENOUS | Status: DC
  Administered 2019-07-04 – 2019-07-06 (×4): via INTRAVENOUS

## 2019-07-04 MED ORDER — POTASSIUM CHLORIDE CRYS ER 20 MEQ PO TBCR
40.0000 meq | EXTENDED_RELEASE_TABLET | Freq: Once | ORAL | Status: AC
  Administered 2019-07-04: 11:00:00 40 meq via ORAL
  Filled 2019-07-04: qty 2

## 2019-07-04 MED ORDER — ADULT MULTIVITAMIN W/MINERALS CH
1.0000 | ORAL_TABLET | Freq: Every day | ORAL | Status: DC
  Administered 2019-07-04 – 2019-07-06 (×3): 1 via ORAL
  Filled 2019-07-04 (×3): qty 1

## 2019-07-04 MED ORDER — PRO-STAT SUGAR FREE PO LIQD
30.0000 mL | Freq: Two times a day (BID) | ORAL | Status: DC
  Administered 2019-07-04 – 2019-07-05 (×3): 30 mL via ORAL
  Filled 2019-07-04 (×4): qty 30

## 2019-07-04 MED ORDER — BOOST / RESOURCE BREEZE PO LIQD CUSTOM
1.0000 | Freq: Three times a day (TID) | ORAL | Status: DC
  Administered 2019-07-04 (×2): 1 via ORAL

## 2019-07-04 MED ORDER — VITAMIN B-1 100 MG PO TABS
100.0000 mg | ORAL_TABLET | Freq: Every day | ORAL | Status: DC
  Administered 2019-07-05 – 2019-07-06 (×2): 100 mg via ORAL
  Filled 2019-07-04 (×2): qty 1

## 2019-07-04 NOTE — Progress Notes (Signed)
Hallucinating.  States she saw children and blonde headed adult walking in hall without mask.  Was slightly agitated.  Given a mask at her request.

## 2019-07-04 NOTE — Progress Notes (Signed)
PROGRESS NOTE    Shelly Barrett  [id number] DOB: 1940/12/07 DOA: 07/02/2019 PCP: [name], MD    Brief Narrative:  78 year old female who presented with a mechanical fall and generalized weakness.  Her past medical history includes dyslipidemia, arthritis, scoliosis and diverticulosis.  Patient has been suffering from constipation, had to manually disimpact herself.  On the day of admission she she had a large amount of liquid stool then slipped down while being on the toilet, no syncope, she was very weak, unable to stand, she spent about 2 hours on the floor.  Her son brought her to the hospital for further evaluation.  On her initial physical examination her blood pressure was 97/46, heart rate 78, respiratory rate 22, temperature 99.7, oxygen saturation 97%.  She was ill looking appearing, dry mucous membranes, her lungs were clear to auscultation heart S1-S2 present regular, the abdomen was soft, no lower extremity edema. Sodium 138, potassium 2.7, chloride 102, bicarb 23, glucose 135, BUN 19, creatinine 0.76, phosphorus 2.5, magnesium 2.1, total CK 3176, white count 15.4, hemoglobin 11.7, hematocrit 35.6, platelets 141.  SARS COVID-19 was negative.  Her urinalysis had positive nitrates, 6-10 red cells and 21-50 white cells.  Her chest radiograph had no infiltrates.  Abdominal film with mildly dilated small bowel in the pelvis and left lower abdomen, ileus versus early small bowel obstruction.  EKG 105 bpm, normal axis, prolonged QTC 519, sinus rhythm, ST segment depression V5 V6, no significant T wave changes.  Patient was admitted to the hospital with a working diagnosis of sepsis due to urine tract infection, complicated by ileus, hypokalemia and rhabdomyolysis.   Assessment & Plan:   Active Problems:   Hypertension   Hyperlipidemia   Malnutrition of mild degree (HCC)   Scoliosis of thoracolumbar spine   Sepsis (HCC)   Ileus (HCC)   Hypokalemia   Rhabdomyolysis  Dehydration   Normocytic anemia   Hypotension   Elevated troponin   Acute lower UTI   E coli bacteremia   Pressure injury of skin   1. Sepsis due to urinary tract infection, with E coli bacteremia. Her blood pressure has been stable [id number] systolic, no signs of volume overload. Blood cultures positive for E coli, pending sensitivity, old records personally reviewed urine culture positive for E coli on 08.19 sensitive to cephalosporins. WBC is down to 14 from 17. Will continue antibiotic therapy with ceftriaxone.  Troponin elevation, due to sepsis, no acs, now trending down, EKG changes due to repolarization abnormalities, will repeat EKG today.   2. Ileus. Clinically with no abdominal distention, no nausea or vomiting and positive bowel movement, will advance diet to soft and continue close monitoring.   3. Rhabdomyolysis with hypokalemia. CK is now trending down to 5,0011 from 5,242, renal function stable with serum cr at 0,43, K continue to be low at 3.5. Urine output documented only 300 cc (posible not accurate).  Will continue hydration with lactate ringers at 100 ml per H, will continue to follow renal panel in am.   4. Chronic anemia with iron deficiency. Hgb and hct have been stable, low iron stores with serum iron 9, tibc 311, transferrin saturation 3 and ferritin 140.   5. Moderate calorie protein malnutrition. Will advance diet to full liquids, and continue to advance as tolerated.   6. Stage 2 sacrum pressure ulcer, present on admission. Continue with local wound care.   DVT prophylaxis: enoxaparin   Code Status:  full Family Communication: no family at the bedside  Disposition Plan/ discharge barriers: transfer to telemetry   Body mass index is 14.7 kg/m. Malnutrition Type:      Malnutrition Characteristics:      Nutrition Interventions:     RN Pressure Injury Documentation: Pressure Injury 07/03/19 Sacrum Mid Stage II -  Partial thickness loss of dermis presenting  as a shallow open ulcer with a red, pink wound bed without slough. (Active)  07/03/19 1630  Location: Sacrum  Location Orientation: Mid  Staging: Stage II -  Partial thickness loss of dermis presenting as a shallow open ulcer with a red, pink wound bed without slough.  Wound Description (Comments):   Present on Admission: Yes     Consultants:     Procedures:     Antimicrobials:   IV ceftriaxone     Subjective: Patient is very weak and deconditioned, tolerating clears well, no nausea or vomiting, no dyspnea, chest pain or abdominal pain.   Objective: Vitals:   07/04/19 0400 07/04/19 0700 07/04/19 0800 07/04/19 0805  BP: (!) 114/49 125/62 (!) 123/48   Pulse: 88 90 81   Resp: (!) 30 17 (!) 24   Temp:    98.1 F (36.7 C)  TempSrc:    Oral  SpO2: 97% 97% 96%   Weight:      Height:        Intake/Output Summary (Last 24 hours) at 07/04/2019 0903 Last data filed at 07/04/2019 0701 Gross per 24 hour  Intake 2686.22 ml  Output 300 ml  Net 2386.22 ml   Filed Weights   07/03/19 1309  Weight: 37.6 kg    Examination:   General: deconditioned and ill looking appearing.  Neurology: Awake and alert, non focal  E ENT: mild pallor, no icterus, oral mucosa moist Cardiovascular: No JVD. S1-S2 present, rhythmic, no gallops, rubs, or murmurs. No lower extremity edema. Pulmonary: positive breath sounds bilaterally, adequate air movement, no wheezing, rhonchi or rales. Gastrointestinal. Abdomen with no organomegaly, non tender, no rebound or guarding Skin. No rashes Musculoskeletal: no joint deformities     Data Reviewed: I have personally reviewed following labs and imaging studies  CBC: Recent Labs  Lab 07/02/19 2041 07/03/19 0406 07/04/19 0159  WBC 15.4* 17.3* 14.4*  NEUTROABS 14.2*  --   --   HGB 11.7* 10.9* 10.6*  HCT 35.6* 33.3* 33.0*  MCV 91.5 91.7 93.2  PLT 141* 142* 0000000*   Basic Metabolic Panel: Recent Labs  Lab 07/02/19 2041 07/03/19 0406 07/03/19  0851 07/04/19 0159  NA 138 141  --  142  K 2.7* 3.1*  --  3.5  CL 102 110  --  114*  CO2 23 22  --  22  GLUCOSE 135* 95  --  87  BUN 19 16  --  10  CREATININE 0.76 0.68  --  0.43*  CALCIUM 9.0 8.2*  --  8.2*  MG 2.1 1.9 1.9  --   PHOS 2.5 3.5  --   --    GFR: Estimated Creatinine Clearance: 35 mL/min (A) (by C-G formula based on SCr of 0.43 mg/dL (L)). Liver Function Tests: Recent Labs  Lab 07/02/19 2041 07/03/19 0406  AST 103* 126*  ALT 45* 47*  ALKPHOS 99 86  BILITOT 0.7 0.4  PROT 6.4* 5.8*  ALBUMIN 4.0 3.3*   No results for input(s): LIPASE, AMYLASE in the last 168 hours. No results for input(s): AMMONIA in the last 168 hours. Coagulation Profile: Recent Labs  Lab 07/02/19 2041 07/03/19 0406  INR 1.2 1.4*  Cardiac Enzymes: Recent Labs  Lab 07/02/19 2041 07/03/19 0406 07/03/19 1313  CKTOTAL 3,176* 5,242* 5,011*   BNP (last 3 results) No results for input(s): PROBNP in the last 8760 hours. HbA1C: No results for input(s): HGBA1C in the last 72 hours. CBG: No results for input(s): GLUCAP in the last 168 hours. Lipid Profile: No results for input(s): CHOL, HDL, LDLCALC, TRIG, CHOLHDL, LDLDIRECT in the last 72 hours. Thyroid Function Tests: Recent Labs    07/03/19 0406  TSH 0.932   Anemia Panel: Recent Labs    07/03/19 0406  VITAMINB12 305  FOLATE 6.6  FERRITIN 140  TIBC 311  IRON 9*  RETICCTPCT 0.8      Radiology Studies: I have reviewed all of the imaging during this hospital visit personally     Scheduled Meds: . Chlorhexidine Gluconate Cloth  6 each Topical Daily  . thiamine injection  100 mg Intravenous Daily   Continuous Infusions: . sodium chloride 100 mL/hr at 07/04/19 0142  . meropenem (MERREM) IV 1 g (07/04/19 0820)     LOS: 2 days         Gerome Apley, MD

## 2019-07-04 NOTE — Evaluation (Signed)
Occupational Therapy Evaluation Patient Details Name: Shelly Barrett MRN: [id number] DOB: 08-03-1941 Today's Date: 07/04/2019    History of Present Illness Patient is a 78 y/o female who presented with a mechanical fall and generalized weakness. Admitted to the hospital with a working diagnosis of sepsis due to urine tract infection, complicated by ileus, hypokalemia and rhabdomyolysis. PMHx includes arthritis, scoliosis, hx breast surgery   Clinical Impression   This 78 y/o female presents with the above. PTA pt was independent with ADL, iADL and functional mobility; was living with her spouse who pt reports has dementia. Pt presenting with generalized weakness, decreased standing balance and functional performance. She currently requires min-modA (+2 safety) for completing functional transfers; requiring maxA for toileting and LB ADL, minguard-minA for seated UB ADL. Pt reports some dizziness with sitting up and transfers today, BP monitored and stable (120/63 start of session, 142/75 post transfer to [facility]). She will benefit from continued acute OT services and recommend post acute therapies prior to return home to maximize her safety and independence with ADL and mobility. Will follow.     Follow Up Recommendations  SNF;Supervision/Assistance - 24 hour(pending progressq)    Equipment Recommendations  3 in 1 bedside commode;Other (comment)(TBD in next venue)           Precautions / Restrictions Precautions Precautions: Fall Restrictions Weight Bearing Restrictions: No      Mobility Bed Mobility Overal bed mobility: Needs Assistance Bed Mobility: Supine to Sit;Sit to Supine     Supine to sit: Mod assist Sit to supine: Mod assist   General bed mobility comments: assist for LEs over EOB and trunk elevation, increased time and effort to scoot towards EOB  Transfers Overall transfer level: Needs assistance Equipment used: 1 person hand held assist Transfers: Sit to/from  [employer] Transfers Sit to Stand: Min assist;+2 safety/equipment Stand pivot transfers: Mod assist;Min assist;+2 safety/equipment       General transfer comment: modA initially for transfer to [facility], improvements with additional transfers post toileting requiring minA for balance    Balance Overall balance assessment: Needs assistance Sitting-balance support: Feet supported Sitting balance-Leahy Scale: Good     Standing balance support: Single extremity supported;Bilateral upper extremity supported;During functional activity Standing balance-Leahy Scale: Poor Standing balance comment: reliant on external assist                           ADL either performed or assessed with clinical judgement   ADL Overall ADL's : Needs assistance/impaired Eating/Feeding: Independent;Sitting   Grooming: Set up;Sitting   Upper Body Bathing: Supervision/ safety;Set up;Sitting   Lower Body Bathing: Moderate assistance;Sit to/from stand   Upper Body Dressing : Minimal assistance;Sitting   Lower Body Dressing: Moderate assistance;Maximal assistance;+2 for safety/equipment;Sit to/from stand   Toilet Transfer: Stand-pivot;BSC;Moderate assistance;Minimal Print production planner Details (indicate cue type and reason): modA for initial transfer progressed to [place] and Hygiene: Maximal assistance;+2 for physical assistance;+2 for safety/equipment;Sit to/from stand Toileting - Clothing Manipulation Details (indicate cue type and reason): assist for clothing management and peri-care after voiding bladder and BM with additional (+2) assist for standing balance     Functional mobility during ADLs: Minimal assistance;Moderate assistance;+2 for safety/equipment General ADL Comments: pt limited due to weakness, dizziness with sitting up, and frequent urgency to urinate/have BM                         Pertinent Vitals/Pain Pain  Assessment:  No/denies pain     Hand Dominance     Extremity/Trunk Assessment Upper Extremity Assessment Upper Extremity Assessment: Generalized weakness   Lower Extremity Assessment Lower Extremity Assessment: Defer to PT evaluation       Communication Communication Communication: No difficulties   Cognition Arousal/Alertness: Awake/alert Behavior During Therapy: WFL for tasks assessed/performed Overall Cognitive Status: Within Functional Limits for tasks assessed                                     General Comments  pt with +dizziness sitting upright, BP monitored and stable, initially 120/63 in supine, 117/72 seated EOB, 142/75 post transfer to [facility]    Exercises     Shoulder Instructions      Home Living Family/patient expects to be discharged to:: Private residence Living Arrangements: Spouse/significant other Available Help at Discharge: Family;Available 24 hours/day Type of Home: House Home Access: Stairs to enter [employer] of Steps: 3 on front, 2 at kitchen (mainly enter at [employer]) Entrance Stairs-Rails: Left Home Layout: One level     Bathroom Shower/Tub: Teacher, early years/pre: Standard     Home Equipment: Grab bars - toilet;Grab bars - tub/shower;Bedside commode          Prior Functioning/Environment Level of Independence: Independent        Comments: pt performs iADL tasks, spouse has bad dementia         OT Problem List: Decreased strength;Decreased activity tolerance;Impaired balance (sitting and/or standing);Decreased knowledge of use of DME or AE      OT Treatment/Interventions: Self-care/ADL training;Therapeutic exercise;Energy conservation;DME and/or AE instruction;Therapeutic activities;Patient/family education;Balance training    OT Goals(Current goals can be found in the care plan section) Acute Rehab OT Goals Patient Stated Goal: home, regain strength OT Goal Formulation: With patient Time For Goal  Achievement: 07/18/19 Potential to Achieve Goals: Good  OT Frequency: Min 2X/week   Barriers to D/C:            Co-evaluation PT/OT/SLP Co-Evaluation/Treatment: Yes(overlap with PT) Reason for Co-Treatment: For patient/therapist safety   OT goals addressed during session: ADL's and self-care      AM-PAC OT "6 Clicks" Daily Activity     Outcome Measure Help from another person eating meals?: None Help from another person taking care of personal grooming?: A Little Help from another person toileting, which includes using toliet, bedpan, or urinal?: A Lot Help from another person bathing (including washing, rinsing, drying)?: A Lot Help from another person to put on and taking off regular upper body clothing?: A Little Help from another person to put on and taking off regular lower body clothing?: A Lot 6 Click Score: 16   End of Session Equipment Utilized During Treatment: Gait belt Nurse Communication: Mobility status  Activity Tolerance: Patient tolerated treatment well Patient left: in bed;with call bell/phone within reach;with bed alarm set  OT Visit Diagnosis: Unsteadiness on feet (R26.81);Muscle weakness (generalized) (M62.81)                Time: [id number] OT Time Calculation (min): 45 min Charges:  OT General Charges $OT Visit: 1 Visit OT Evaluation $OT Eval Moderate Complexity: 1 Mod OT Treatments $Self Care/Home Management : 8-22 mins  Lou Cal, OT Supplemental Rehabilitation Services Pager [phone] Office [phone]   Shelly Barrett 07/04/2019, 1:13 PM

## 2019-07-04 NOTE — Progress Notes (Signed)
Initial Nutrition Assessment  RD working remotely.   DOCUMENTATION CODES:   Underweight  INTERVENTION:  - will order Boost Breeze TID, each supplement provides 250 kcal and 9 grams of protein. - will order 30 mL Prostat BID, each supplement provides 100 kcal and 15 grams of protein. - will order daily multivitamin with minerals. - continue to encourage PO intakes and advance diet as medically feasible.    NUTRITION DIAGNOSIS:   Increased nutrient needs related to acute illness as evidenced by estimated needs.  GOAL:   Patient will meet greater than or equal to 90% of their needs  MONITOR:   PO intake, Supplement acceptance, Labs, Weight trends  REASON FOR ASSESSMENT:   Consult Malnutrition Eval, Assessment of nutrition requirement/status  ASSESSMENT:   78 y.o. year old female with medical history significant for HTN, HLD, pre-diabetes, vitamin D deficiency, and moderately severe thoracolumbar scoliosis. She presented to the ED on 9/21 after a fall. She reported recently seeing her PCP who encouraged increased fat in her diet. She reported that after eating mashed potatoes she developed worsening abdominal pain, nausea, and inability to tolerate oral intakes. She thought she was constipated and manually self-disimpacted which resulted in a large amount of liquid stool. She reported this was the cause of the fall. Her husband was unable to lift her off the floor so her son arrived and took her to the ED.  Diet advanced from NPO to CLD on 9/22 at 34 and no intakes documented since that time. Per H&P, patient reported that her PCP recently recommended increasing the fat in her diet. This was in an effort to gain weight. CLD is a low-fat, low-protein, low kcal diet so will order supplements to help and adjust supplements with advancement of diet.  She feels that her weight has not changed recently, but reports she has never been a big eater. She prefers to snack throughout the day  when she is feeling hungry vs eating larger meals. She states that recently she has been eating very little as she has been having ongoing abdominal pain, N/V with nearly all PO intakes. Per chart review, current weight is 83 lb and weight has been stable for the past 13 months.   Suspect patient meets criteria for some degree of malnutrition given BMI of 14.7, but unable to confirm without performing NFPE. Patient has not been seen by a [place] RD at any point in the past.    Per notes: - worsening abdominal pain, N/V 2/2 ileus vs early SBO--improving - rhabdomyolysis--d/t being down for unknown period of time - elevated troponin--improving - SIRS--thought to be related to dehydration in the setting of ileus vs SBO - hypotension with lactic acidosis--improving - hypokalemia--repletion ordered and now resolved - acute UTI - moderate malnutrition - chronic normocytic anemia    Labs reviewed; Cl: 114 mmol/l, creatinine: 0.43 mg/dl, Ca: 8.2 mg/dl.  Medications reviewed; 100 mg IV thiamine/day.  IVF; NS @ 100 ml/hr.      NUTRITION - FOCUSED PHYSICAL EXAM:  unable to complete at this time.   Diet Order:   Diet Order            Diet clear liquid Room service appropriate? Yes; Fluid consistency: Thin  Diet effective now              EDUCATION NEEDS:   Not appropriate for education at this time  Skin:  Skin Assessment: Skin Integrity Issues: Skin Integrity Issues:: Stage II Stage II: sacrum  Last  BM:  9/23  Height:   Ht Readings from Last 1 Encounters:  07/03/19 5\' 3"  (1.6 m)    Weight:   Wt Readings from Last 1 Encounters:  07/03/19 37.6 kg    Ideal Body Weight:  52.3 kg  BMI:  Body mass index is 14.7 kg/m.  Estimated Nutritional Needs:   Kcal:  1550-1750 kcal  Protein:  65-75 grams  Fluid:  >/= 1.8 L/day      Jarome Matin, MS, RD, LDN, [facility] Inpatient Clinical Dietitian Pager # [phone] After hours/weekend pager # [phone]

## 2019-07-04 NOTE — Evaluation (Signed)
Physical Therapy Evaluation Patient Details Name: Shelly Barrett MRN: [id number] DOB: Jan 19, 1941 Today's Date: 07/04/2019   History of Present Illness  Patient is a 78 y/o female who presented with a mechanical fall and generalized weakness. Admitted to the hospital with a working diagnosis of sepsis due to urine tract infection, complicated by ileus, hypokalemia and rhabdomyolysis. PMHx includes arthritis, scoliosis, hx breast surgery  Clinical Impression  Pt presents with generalized weakness, impaired standing balance requiring external support, difficulty performing mobility tasks, and decreased activity tolerance. VSS. Pt to benefit from acute PT to address deficits. Pt performed bed mobility and transfers to and from [facility] with min-mod assist, requiring external steadying and assist with pericare. PT recommending SNF post-acutely, pt lives with her husband who has dementia and will not be able to assist her. Pt's husband is being cared for currently by pt's son. PT to progress mobility as tolerated, and will continue to follow acutely.      Follow Up Recommendations SNF    Equipment Recommendations  None recommended by PT    Recommendations for Other Services       Precautions / Restrictions Precautions Precautions: Fall Restrictions Weight Bearing Restrictions: No      Mobility  Bed Mobility Overal bed mobility: Needs Assistance Bed Mobility: Sit to Supine     Supine to sit: Mod assist Sit to supine: Mod assist   General bed mobility comments: assist for LEs over EOB and trunk elevation, increased time and effort to scoot towards EOB. PT not present for supine to sit, OT already in room and assisted with supine to sit.  Transfers Overall transfer level: Needs assistance Equipment used: 1 person hand held assist Transfers: Sit to/from [employer] Sit to Stand: Min assist;+2 safety/equipment Stand pivot transfers: Mod assist;Min assist;+2  safety/equipment       General transfer comment: modA initially for transfer to [facility], improvements with additional transfers post toileting requiring minA for balance. Sit to stand x3 during session, pt taking a few steps to recliner and back to bed but deferring ambulation.  Ambulation/Gait                Stairs            Wheelchair Mobility    Modified Rankin (Stroke Patients Only)       Balance Overall balance assessment: Needs assistance Sitting-balance support: Feet supported Sitting balance-Leahy Scale: Good     Standing balance support: Single extremity supported;Bilateral upper extremity supported;During functional activity Standing balance-Leahy Scale: Poor Standing balance comment: reliant on external assist                             Pertinent Vitals/Pain Pain Assessment: No/denies pain    Home Living Family/patient expects to be discharged to:: Private residence Living Arrangements: Spouse/significant other Available Help at Discharge: Family;Available 24 hours/day Type of Home: House Home Access: Stairs to enter Entrance Stairs-Rails: Left Entrance Stairs-Number of Steps: 3 on front, 2 at kitchen (mainly enter at kitchen) Home Layout: One level Home Equipment: Grab bars - toilet;Grab bars - tub/shower;Bedside commode      Prior Function Level of Independence: Independent         Comments: pt performs iADL tasks, spouse has bad dementia      Hand Dominance        Extremity/Trunk Assessment   Upper Extremity Assessment Upper Extremity Assessment: Defer to OT evaluation    Lower Extremity Assessment  Lower Extremity Assessment: Generalized weakness(at least 3/5 knee extension, knee flexion, hip flexion, hip adduction/abduction, PF/DF)    Cervical / Trunk Assessment Cervical / Trunk Assessment: Normal  Communication   Communication: No difficulties  Cognition Arousal/Alertness: Awake/alert Behavior During Therapy:  WFL for tasks assessed/performed;Anxious Overall Cognitive Status: Within Functional Limits for tasks assessed                                 General Comments: Pt very scared of falling, trembling in standing      General Comments General comments (skin integrity, edema, etc.): pt with +dizziness sitting upright, BP monitored and stable, initially 120/63 in supine, 117/72 seated EOB, 142/75 post transfer to [facility]    Exercises     Assessment/Plan    PT Assessment Patient needs continued PT services  PT Problem List Decreased strength;Decreased mobility;Decreased balance;Pain;Decreased activity tolerance;Decreased knowledge of use of DME       PT Treatment Interventions DME instruction;Therapeutic activities;Therapeutic exercise;Gait training;Patient/family education;Balance training;Functional mobility training    PT Goals (Current goals can be found in the Care Plan section)  Acute Rehab PT Goals Patient Stated Goal: home, regain strength PT Goal Formulation: With patient Time For Goal Achievement: 07/18/19 Potential to Achieve Goals: Good    Frequency Min 2X/week   Barriers to discharge        Co-evaluation PT/OT/SLP Co-Evaluation/Treatment: Yes Reason for Co-Treatment: For patient/therapist safety PT goals addressed during session: Mobility/safety with mobility OT goals addressed during session: ADL's and self-care       AM-PAC PT "6 Clicks" Mobility  Outcome Measure Help needed turning from your back to your side while in a flat bed without using bedrails?: A Lot Help needed moving from lying on your back to sitting on the side of a flat bed without using bedrails?: A Lot Help needed moving to and from a bed to a chair (including a wheelchair)?: A Lot Help needed standing up from a chair using your arms (e.g., wheelchair or bedside chair)?: A Little Help needed to walk in hospital room?: A Lot Help needed climbing 3-5 steps with a railing? : Total 6  Click Score: 12    End of Session Equipment Utilized During Treatment: Gait belt Activity Tolerance: Patient tolerated treatment well;Patient limited by fatigue Patient left: in bed;with call bell/phone within reach;with bed alarm set;with SCD's reapplied Nurse Communication: Mobility status PT Visit Diagnosis: Other abnormalities of gait and mobility (R26.89);Difficulty in walking, not elsewhere classified (R26.2)    Time: 2355-7322 PT Time Calculation (min) (ACUTE ONLY): 15 min   Charges:   PT Evaluation $PT Eval Low Complexity: 1 Low          Nicola Police, PT Acute Rehabilitation Services Pager [phone]  Office [phone]   Abbagale Goguen D Despina Hidden 07/04/2019, 2:12 PM

## 2019-07-05 LAB — CBC WITH DIFFERENTIAL/PLATELET
Abs Immature Granulocytes: 0.17 10*3/uL — ABNORMAL HIGH (ref 0.00–0.07)
Basophils Absolute: 0 10*3/uL (ref 0.0–0.1)
Basophils Relative: 0 %
Eosinophils Absolute: 0 10*3/uL (ref 0.0–0.5)
Eosinophils Relative: 0 %
HCT: 32.5 % — ABNORMAL LOW (ref 36.0–46.0)
Hemoglobin: 10.5 g/dL — ABNORMAL LOW (ref 12.0–15.0)
Immature Granulocytes: 1 %
Lymphocytes Relative: 6 %
Lymphs Abs: 0.7 10*3/uL (ref 0.7–4.0)
MCH: 30 pg (ref 26.0–34.0)
MCHC: 32.3 g/dL (ref 30.0–36.0)
MCV: 92.9 fL (ref 80.0–100.0)
Monocytes Absolute: 0.5 10*3/uL (ref 0.1–1.0)
Monocytes Relative: 4 %
Neutro Abs: 10.6 10*3/uL — ABNORMAL HIGH (ref 1.7–7.7)
Neutrophils Relative %: 89 %
Platelets: 144 10*3/uL — ABNORMAL LOW (ref 150–400)
RBC: 3.5 MIL/uL — ABNORMAL LOW (ref 3.87–5.11)
RDW: 14.8 % (ref 11.5–15.5)
WBC: 12.1 10*3/uL — ABNORMAL HIGH (ref 4.0–10.5)
nRBC: 0 % (ref 0.0–0.2)

## 2019-07-05 LAB — MAGNESIUM: Magnesium: 1.9 mg/dL (ref 1.7–2.4)

## 2019-07-05 LAB — BASIC METABOLIC PANEL
Anion gap: 7 (ref 5–15)
BUN: 10 mg/dL (ref 8–23)
CO2: 21 mmol/L — ABNORMAL LOW (ref 22–32)
Calcium: 8.1 mg/dL — ABNORMAL LOW (ref 8.9–10.3)
Chloride: 112 mmol/L — ABNORMAL HIGH (ref 98–111)
Creatinine, Ser: 0.35 mg/dL — ABNORMAL LOW (ref 0.44–1.00)
GFR calc Af Amer: >60 mL/min (ref >60)
GFR calc non Af Amer: >60 mL/min (ref >60)
Glucose, Bld: 116 mg/dL — ABNORMAL HIGH (ref 70–99)
Potassium: 3.3 mmol/L — ABNORMAL LOW (ref 3.5–5.1)
Sodium: 140 mmol/L (ref 135–145)

## 2019-07-05 LAB — CULTURE, BLOOD (ROUTINE X 2)

## 2019-07-05 LAB — URINE CULTURE: Culture: 40000 — AB

## 2019-07-05 LAB — CK: Total CK: 663 U/L — ABNORMAL HIGH (ref 38–234)

## 2019-07-05 MED ORDER — MAGNESIUM SULFATE 2 GM/50ML IV SOLN
2.0000 g | Freq: Once | INTRAVENOUS | Status: AC
  Administered 2019-07-05: 2 g via INTRAVENOUS
  Filled 2019-07-05: qty 50

## 2019-07-05 MED ORDER — POTASSIUM CHLORIDE CRYS ER 20 MEQ PO TBCR
40.0000 meq | EXTENDED_RELEASE_TABLET | ORAL | Status: AC
  Administered 2019-07-05 (×2): 40 meq via ORAL
  Filled 2019-07-05 (×2): qty 2

## 2019-07-05 MED ORDER — CEFAZOLIN SODIUM-DEXTROSE 1-4 GM/50ML-% IV SOLN
1.0000 g | Freq: Three times a day (TID) | INTRAVENOUS | Status: DC
  Administered 2019-07-05 – 2019-07-06 (×3): 1 g via INTRAVENOUS
  Filled 2019-07-05 (×4): qty 50

## 2019-07-05 NOTE — Progress Notes (Signed)
PROGRESS NOTE    Shelly Barrett  [id number] DOB: 06-30-1941 DOA: 07/02/2019 PCP: [name], MD    Brief Narrative:  78 year old female who presented with a mechanical fall and generalized weakness.  Her past medical history includes dyslipidemia, arthritis, scoliosis and diverticulosis.  Patient has been suffering from constipation, had to manually disimpact herself.  On the day of admission she she had a large amount of liquid stool then slipped down while being on the toilet, no syncope, she was very weak, unable to stand, she spent about 2 hours on the floor.  Her son brought her to the hospital for further evaluation.  On her initial physical examination her blood pressure was 97/46, heart rate 78, respiratory rate 22, temperature 99.7, oxygen saturation 97%.  She was ill looking appearing, dry mucous membranes, her lungs were clear to auscultation heart S1-S2 present regular, the abdomen was soft, no lower extremity edema. Sodium 138, potassium 2.7, chloride 102, bicarb 23, glucose 135, BUN 19, creatinine 0.76, phosphorus 2.5, magnesium 2.1, total CK 3176, white count 15.4, hemoglobin 11.7, hematocrit 35.6, platelets 141.  SARS COVID-19 was negative.  Her urinalysis had positive nitrates, 6-10 red cells and 21-50 white cells.  Her chest radiograph had no infiltrates.  Abdominal film with mildly dilated small bowel in the pelvis and left lower abdomen, ileus versus early small bowel obstruction.  EKG 105 bpm, normal axis, prolonged QTC 519, sinus rhythm, ST segment depression V5 V6, no significant T wave changes.  Patient was admitted to the hospital with a working diagnosis of sepsis due to urinary tract infection, complicated by ileus, hypokalemia and rhabdomyolysis.  Blood cultures positive for E coli and narrowed antibiotic therapy to ceftriaxone.    Assessment & Plan:   Active Problems:   Hypertension   Hyperlipidemia   Malnutrition of mild degree (HCC)   Scoliosis of  thoracolumbar spine   Sepsis (HCC)   Ileus (HCC)   Hypokalemia   Rhabdomyolysis   Dehydration   Normocytic anemia   Hypotension   Elevated troponin   Acute lower UTI   E coli bacteremia   Pressure injury of skin   1. Sepsis due to urinary tract infection, with E coli bacteremia/ sepsis related troponin elevation. Patient continue to be hemodynamically stable, blood pressure systolic this am is [id number] with HR 97, no clinical signs of volume overload. WBC is down 12.1 and patient tolerating well IV ceftriaxone. No chest pain. Follow with EKG.   2. Ileus. Clinically has resolved, patient tolerated well soft diet, will advance to regular. Positive diarrhea.   3. Rhabdomyolysis with hypokalemia, non gap metabolic acidosis. No signs of volume overload, urine output over last 24 H, is 1,350 ml. Preserved renal function with serum cr at 0.35, K at 3,3. Mg 1,9 and serum bicarbonate at 21. Continue K correction with Kcl, and Mg with Mag sulfate, patient with GI losses due to diarrhea. Continue current rate of IV fluids with LR and D5 at 100 ml per H.   4. Chronic anemia with iron deficiency.  iron stores with serum iron 9, tibc 311, transferrin saturation 3 and ferritin 140. Hgb continue to be stable, will plan to give IV iron on this hospitalization, once sepsis is resolved.   5. Moderate calorie protein malnutrition. Continue nutritional supplements, advance diet to regular.   6. Stage 2 sacrum pressure ulcer, present on admission. Local wound care.   DVT prophylaxis: enoxaparin   Code Status:  full Family Communication: no family at the bedside  Disposition Plan/ discharge barriers: transfer medical ward.       Body mass index is 14.7 kg/m. Malnutrition Type:  Nutrition Problem: Increased nutrient needs Etiology: acute illness   Malnutrition Characteristics:  Signs/Symptoms: estimated needs   Nutrition Interventions:  Interventions: Boost Breeze, Prostat, MVI  RN  Pressure Injury Documentation: Pressure Injury 07/03/19 Sacrum Mid Stage II -  Partial thickness loss of dermis presenting as a shallow open ulcer with a red, pink wound bed without slough. (Active)  07/03/19 1630  Location: Sacrum  Location Orientation: Mid  Staging: Stage II -  Partial thickness loss of dermis presenting as a shallow open ulcer with a red, pink wound bed without slough.  Wound Description (Comments):   Present on Admission: Yes     Consultants:     Procedures:     Antimicrobials:   IV ceftriaxone     Subjective: Patient is feeling well, had episode of hallucinations yesterday PM, now resolved. Reports positive diarrhea and generalized weakness, no nausea or vomiting and tolerating po well.   Objective: Vitals:   07/05/19 0600 07/05/19 0700 07/05/19 0800 07/05/19 0808  BP: (!) 122/53 (!) 132/50 126/62   Pulse: 81 92 97   Resp: 15 19 18    Temp:    98.4 F (36.9 C)  TempSrc:    Oral  SpO2: 95% 95% 96%   Weight:      Height:        Intake/Output Summary (Last 24 hours) at 07/05/2019 0853 Last data filed at 07/05/2019 [id number] Gross per 24 hour  Intake 3050.36 ml  Output 1350 ml  Net 1700.36 ml   Filed Weights   07/03/19 1309  Weight: 37.6 kg    Examination:   General: deconditioned and ill looking appearing  Neurology: Awake and alert, non focal  E ENT: positive pallor, no icterus, oral mucosa moist Cardiovascular: No JVD. S1-S2 present, rhythmic, no gallops, rubs, or murmurs. No lower extremity edema. Pulmonary: positive breath sounds bilaterally, adequate air movement, no wheezing, rhonchi or rales. Gastrointestinal. Abdomen with no organomegaly, non tender, no rebound or guarding Skin. No rashes Musculoskeletal: no joint deformities     Data Reviewed: I have personally reviewed following labs and imaging studies  CBC: Recent Labs  Lab 07/02/19 2041 07/03/19 0406 07/04/19 0159 07/05/19 0207  WBC 15.4* 17.3* 14.4* 12.1*  NEUTROABS  14.2*  --   --  10.6*  HGB 11.7* 10.9* 10.6* 10.5*  HCT 35.6* 33.3* 33.0* 32.5*  MCV 91.5 91.7 93.2 92.9  PLT 141* 142* 137* [id number]*   Basic Metabolic Panel: Recent Labs  Lab 07/02/19 2041 07/03/19 0406 07/03/19 0851 07/04/19 0159 07/05/19 0207  NA 138 141  --  142 140  K 2.7* 3.1*  --  3.5 3.3*  CL 102 110  --  114* 112*  CO2 23 22  --  22 21*  GLUCOSE 135* 95  --  87 116*  BUN 19 16  --  10 10  CREATININE 0.76 0.68  --  0.43* 0.35*  CALCIUM 9.0 8.2*  --  8.2* 8.1*  MG 2.1 1.9 1.9  --  1.9  PHOS 2.5 3.5  --   --   --    GFR: Estimated Creatinine Clearance: 35 mL/min (A) (by C-G formula based on SCr of 0.35 mg/dL (L)). Liver Function Tests: Recent Labs  Lab 07/02/19 2041 07/03/19 0406  AST 103* 126*  ALT 45* 47*  ALKPHOS 99 86  BILITOT 0.7 0.4  PROT 6.4* 5.8*  ALBUMIN  4.0 3.3*   No results for input(s): LIPASE, AMYLASE in the last 168 hours. No results for input(s): AMMONIA in the last 168 hours. Coagulation Profile: Recent Labs  Lab 07/02/19 2041 07/03/19 0406  INR 1.2 1.4*   Cardiac Enzymes: Recent Labs  Lab 07/02/19 2041 07/03/19 0406 07/03/19 1313  CKTOTAL 3,176* 5,242* 5,011*   BNP (last 3 results) No results for input(s): PROBNP in the last 8760 hours. HbA1C: No results for input(s): HGBA1C in the last 72 hours. CBG: No results for input(s): GLUCAP in the last 168 hours. Lipid Profile: No results for input(s): CHOL, HDL, LDLCALC, TRIG, CHOLHDL, LDLDIRECT in the last 72 hours. Thyroid Function Tests: Recent Labs    07/03/19 0406  TSH 0.932   Anemia Panel: Recent Labs    07/03/19 0406  VITAMINB12 305  FOLATE 6.6  FERRITIN 140  TIBC 311  IRON 9*  RETICCTPCT 0.8      Radiology Studies: I have reviewed all of the imaging during this hospital visit personally     Scheduled Meds: . Chlorhexidine Gluconate Cloth  6 each Topical Daily  . feeding supplement  1 Container Oral TID BM  . feeding supplement (PRO-STAT SUGAR FREE 64)  30  mL Oral BID  . multivitamin with minerals  1 tablet Oral Daily  . thiamine  100 mg Oral Daily   Continuous Infusions: . cefTRIAXone (ROCEPHIN)  IV Stopped (07/04/19 1711)  . dextrose 5% lactated ringers 100 mL/hr at 07/05/19 0845     LOS: 3 days         Gerome Apley, MD

## 2019-07-05 NOTE — Progress Notes (Signed)
I spoke with the patient's son bout patient's condition, plan of care and all questions were addressed.

## 2019-07-06 LAB — BASIC METABOLIC PANEL
Anion gap: 8 (ref 5–15)
BUN: 11 mg/dL (ref 8–23)
CO2: 21 mmol/L — ABNORMAL LOW (ref 22–32)
Calcium: 8.5 mg/dL — ABNORMAL LOW (ref 8.9–10.3)
Chloride: 109 mmol/L (ref 98–111)
Creatinine, Ser: 0.38 mg/dL — ABNORMAL LOW (ref 0.44–1.00)
GFR calc Af Amer: >60 mL/min (ref >60)
GFR calc non Af Amer: >60 mL/min (ref >60)
Glucose, Bld: 96 mg/dL (ref 70–99)
Potassium: 4.3 mmol/L (ref 3.5–5.1)
Sodium: 138 mmol/L (ref 135–145)

## 2019-07-06 LAB — CBC WITH DIFFERENTIAL/PLATELET
Abs Immature Granulocytes: 0.15 10*3/uL — ABNORMAL HIGH (ref 0.00–0.07)
Basophils Absolute: 0 10*3/uL (ref 0.0–0.1)
Basophils Relative: 1 %
Eosinophils Absolute: 0.1 10*3/uL (ref 0.0–0.5)
Eosinophils Relative: 1 %
HCT: 36.1 % (ref 36.0–46.0)
Hemoglobin: 11.4 g/dL — ABNORMAL LOW (ref 12.0–15.0)
Immature Granulocytes: 2 %
Lymphocytes Relative: 14 %
Lymphs Abs: 1.1 10*3/uL (ref 0.7–4.0)
MCH: 29.3 pg (ref 26.0–34.0)
MCHC: 31.6 g/dL (ref 30.0–36.0)
MCV: 92.8 fL (ref 80.0–100.0)
Monocytes Absolute: 0.8 10*3/uL (ref 0.1–1.0)
Monocytes Relative: 10 %
Neutro Abs: 5.7 10*3/uL (ref 1.7–7.7)
Neutrophils Relative %: 72 %
Platelets: 174 10*3/uL (ref 150–400)
RBC: 3.89 MIL/uL (ref 3.87–5.11)
RDW: 14.9 % (ref 11.5–15.5)
WBC: 7.8 10*3/uL (ref 4.0–10.5)
nRBC: 0 % (ref 0.0–0.2)

## 2019-07-06 LAB — CK: Total CK: 332 U/L — ABNORMAL HIGH (ref 38–234)

## 2019-07-06 MED ORDER — ADULT MULTIVITAMIN W/MINERALS CH: 1.0000 | ORAL_TABLET | Freq: Every day | ORAL | 0 refills | Status: AC

## 2019-07-06 MED ORDER — ACETAMINOPHEN 325 MG PO TABS: 650.0000 mg | ORAL_TABLET | Freq: Four times a day (QID) | ORAL | 0 refills | Status: DC | PRN

## 2019-07-06 MED ORDER — PRO-STAT SUGAR FREE PO LIQD: 30.0000 mL | Freq: Two times a day (BID) | ORAL | 0 refills | Status: DC

## 2019-07-06 MED ORDER — CEPHALEXIN 500 MG PO CAPS: 500.0000 mg | ORAL_CAPSULE | Freq: Two times a day (BID) | ORAL | 0 refills | Status: AC

## 2019-07-06 MED ORDER — SODIUM CHLORIDE 0.9 % IV SOLN: 510.0000 mg | Freq: Once | INTRAVENOUS | Status: DC

## 2019-07-06 MED ORDER — CEPHALEXIN 500 MG PO CAPS
500.0000 mg | ORAL_CAPSULE | Freq: Two times a day (BID) | ORAL | Status: DC
  Administered 2019-07-06: 500 mg via ORAL
  Filled 2019-07-06: qty 1

## 2019-07-06 NOTE — Discharge Summary (Addendum)
Physician Discharge Summary  Shelly Barrett [id number] DOB: [birth year] DOA: 07/02/2019  PCP: [name], MD  Admit date: 07/02/2019 Discharge date: 07/06/2019  Admitted From: Home  Disposition:  Home   Recommendations for Outpatient Follow-up and new medication changes:  1. Follow up with Dr. Melford Aase in 7 days.  2. Continue antibiotic therapy with cephalexin for 10 days.  3. Patient declined SNF.   Home Health: yes   Equipment/Devices: walker    Discharge Condition:stable  CODE STATUS: dnr   Diet recommendation: heart healthy   Brief/Interim Summary: 78 year old female who presented with a mechanical fall and generalized weakness. Her past medical history includes dyslipidemia, arthritis, scoliosis and diverticulosis. Patient has been suffering from constipation, had to manually disimpact herself. On the day of admission she she had a large amount of liquid stool then slipped downwhile being on the toilet, no syncope, she was very weak, unable to stand, she spent about 2 hours on the floor. Her son brought her to the hospital for further evaluation. On her initial physical examination her blood pressure was 97/46, heart rate 78, respiratory rate 22, temperature 99.7, oxygen saturation 97%. She was ill looking appearing, dry mucous membranes, her lungs were clear to auscultation heart S1-S2 present and regular, the abdomen was soft, no lower extremity edema. Sodium 138, potassium 2.7, chloride 102, bicarb 23, glucose 135, BUN 19, creatinine 0.76, phosphorus 2.5, magnesium 2.1, total CK 3176,white count 15.4, hemoglobin 11.7, hematocrit 35.6, platelets 141.SARS COVID-19 was negative. Her urinalysis had positive nitrates, 6-10 red cells and 21-50 white cells. Her chest radiograph had no infiltrates. Abdominal film with mildly dilated small bowel in the pelvis and left lower abdomen, ileus versus early small bowel obstruction. EKG 105 bpm, normal axis, prolonged QTC 519, sinus  rhythm, ST segment depression V5 V6, no significant T wave changes.  Patient was admitted to the hospital with a working diagnosis of sepsis due to urinary tract infection, complicated by ileus, hypokalemia and rhabdomyolysis.  Blood cultures and urine positive for E coli pan-sensitive, antibiotic therapy narrowed with good toleration. Patient very weak and deconditioned, she has declined SNF placement.   1.  Sepsis due to urinary tract infection due to E. coli, complicated by gram-negative bacteremia and troponin elevation present on admission.  Patient was admitted to the stepdown unit, she received intravenous fluids and broad-spectrum IV antibiotic therapy.  Her blood cultures and urine cultures came back positive for E. coli which was pansensitive.  Antibiotic therapy was narrowed to intravenous ceftriaxone with good toleration.  She responded well to the medical therapy and eventually transition to oral cephalexin, 500 mg twice daily.  Serial EKG negative for ischemic changes, troponin elevation related to sepsis, acute coronary syndrome was ruled out.  2.  Ileus.  Initially patient was kept nothing by mouth, she received IV fluids and as needed antiemetics.  Her symptoms improved and her diet was advanced progressively with good toleration.  Patient did not required nasogastric tube decompression. She reported loose stools that have been more formed by the time of discharge.   3.  Rhabdomyolysis with hypokalemia/hypomagnesemia, non-gap metabolic acidosis.  Patient received intravenous balanced electrolytes fluids with good toleration.  No signs of volume overload.  Her potassium and magnesium were corrected with potassium chloride and magnesium sulfate.  CK peaked at 5,011, by the time of discharge is down to 332.  Her kidney function remained stable with a serum creatinine of 0.38-0.35, her discharge potassium was 4.3, serum bicarbonate of 21.  4.  Chronic anemia of iron deficiency.  Iron  stores revealed serum iron of 9, TIBC 311, transferrin saturation of 3 and ferritin 140.  Recommendations for IV iron as outpatient once infection resolved.   5.  Moderate calorie protein malnutrition.  Patient received nutritional supplements with good toleration.  Will need close follow-up as an outpatient.  6.  Stage II sacral pressure ulcer, present on admission.  Continue local wound care.  Discharge Diagnoses:  Active Problems:   Hypertension   Hyperlipidemia   Malnutrition of mild degree (HCC)   Scoliosis of thoracolumbar spine   Sepsis (HCC)   Ileus (HCC)   Hypokalemia   Rhabdomyolysis   Dehydration   Normocytic anemia   Hypotension   Elevated troponin   Acute lower UTI   E coli bacteremia   Pressure injury of skin    Discharge Instructions   Allergies as of 07/06/2019      Reactions   Betadine [povidone Iodine]    Ciprocinonide [fluocinolone]    Ciprofloxacin Hcl    Hives   Flexeril [cyclobenzaprine]    Ibuprofen    Macrobid [nitrofurantoin Macrocrystal] Diarrhea, Nausea And Vomiting, Swelling   Naprosyn [naproxen]    Penicillins    REACTION: rash Documentation from [place] in 2012 patient was on zosyn then on keflex without known adverse reaction   Shellfish Allergy    Sodium Benzoate [nutritional Supplements]    Iodinated Diagnostic Agents Rash      Medication List    TAKE these medications   acetaminophen 325 MG tablet Commonly known as: TYLENOL Take 2 tablets (650 mg total) by mouth every 6 (six) hours as needed for mild pain (or Fever >/= 101).   CALCIUM PO Take 250 mg by mouth.   cephALEXin 500 MG capsule Commonly known as: KEFLEX Take 1 capsule (500 mg total) by mouth every 12 (twelve) hours for 10 days.   cholecalciferol 1000 units tablet Commonly known as: VITAMIN D Take 2,000 Units by mouth daily.   feeding supplement (PRO-STAT SUGAR FREE 64) Liqd Take 30 mLs by mouth 2 (two) times daily.   multivitamin with minerals Tabs  tablet Take 1 tablet by mouth daily.       Allergies  Allergen Reactions  . Betadine [Povidone Iodine]   . Ciprocinonide [Fluocinolone]   . Ciprofloxacin Hcl     Hives  . Flexeril [Cyclobenzaprine]   . Ibuprofen   . Macrobid [Nitrofurantoin Macrocrystal] Diarrhea, Nausea And Vomiting and Swelling  . Naprosyn [Naproxen]   . Penicillins     REACTION: rash Documentation from [place] in 2012 patient was on zosyn then on keflex without known adverse reaction  . Shellfish Allergy   . Sodium Benzoate [Nutritional Supplements]   . Iodinated Diagnostic Agents Rash    Consultations:     Procedures/Studies: Dg Abd 1 View  Result Date: 07/03/2019 CLINICAL DATA:  Ileus. EXAM: ABDOMEN - 1 VIEW COMPARISON:  07/02/2019. FINDINGS: Soft tissue structures are unremarkable. Air-filled loops of small and large bowel noted. Small-bowel distention has improved from prior exam. No free air identified. Lumbar spine scoliosis concave right. Diffuse osteopenia and degenerative change. IMPRESSION: Nondistended air-filled loops of small and large bowel noted. Small-bowel distention has improved from prior exam. Findings suggest adynamic ileus. To demonstrate resolution and to exclude bowel obstruction continued follow-up exam suggested. Electronically Signed   By: Marcello Moores  Register   On: 07/03/2019 10:43   Dg Abd 1 View  Result Date: 07/02/2019 CLINICAL DATA:  Obstipation. EXAM: ABDOMEN - 1 VIEW  COMPARISON:  CT 12/13/2017 FINDINGS: No evidence of free air on single-view. Mildly dilated small bowel in the pelvis and left lower abdomen measuring 3 cm. No significant formed stool in the colon. No radiopaque calculi or abnormal soft tissue calcifications. Scoliotic curvature in degenerative change of the spine. The bones are under mineralized. Included lung bases are clear. IMPRESSION: Mildly dilated small bowel in the pelvis and left lower abdomen, ileus versus early small bowel obstruction. Electronically  Signed   By: Keith Rake M.D.   On: 07/02/2019 23:48   Dg Chest Port 1 View  Result Date: 07/02/2019 CLINICAL DATA:  Shortness of breath today. Status post fall this morning. EXAM: PORTABLE CHEST 1 VIEW COMPARISON:  None. FINDINGS: Lungs are clear. Heart size is normal. No pneumothorax or pleural effusion. Scoliosis noted. No acute bony abnormality. IMPRESSION: No acute disease. Electronically Signed   By: Inge Rise M.D.   On: 07/02/2019 20:39      Procedures:   Subjective: Patient is feeling well this am, no nausea or vomiting, her diarrhea has been improving with more formed stools. No dyspnea or chest pain.   Discharge Exam: Vitals:   07/06/19 0501 07/06/19 0512  BP: (!) 122/58   Pulse: 93   Resp: 10 12  Temp: 98.4 F (36.9 C)   SpO2: 96%    Vitals:   07/05/19 1610 07/05/19 2100 07/06/19 0501 07/06/19 0512  BP: 129/61 121/67 (!) 122/58   Pulse: 85 96 93   Resp: 19 20 10 12   Temp: 99.2 F (37.3 C) 99.7 F (37.6 C) 98.4 F (36.9 C)   TempSrc: Oral Oral    SpO2: 98% 95% 96%   Weight:      Height:        General: Not in pain or dyspnea, deconditioned  Neurology: Awake and alert, non focal  E ENT: mild pallor, no icterus, oral mucosa moist Cardiovascular: No JVD. S1-S2 present, rhythmic, no gallops, rubs, or murmurs. No lower extremity edema. Pulmonary: positive breath sounds bilaterally, adequate air movement, no wheezing, rhonchi or rales. Gastrointestinal. Abdomen with no organomegaly, non tender, no rebound or guarding Skin. No rashes Musculoskeletal: no joint deformities   The results of significant diagnostics from this hospitalization (including imaging, microbiology, ancillary and laboratory) are listed below for reference.     Microbiology: Recent Results (from the past 240 hour(s))  Urine culture     Status: Abnormal   Collection Time: 07/02/19  8:00 PM   Specimen: In/Out Cath Urine  Result Value Ref Range Status   Specimen Description    Final    IN/OUT CATH URINE Performed at [facility] [address] [place], [place] [address]    Special Requests   Final    NONE Performed at [facility], [place] [address], [place], [place] [address]    Culture 40,000 COLONIES/mL ESCHERICHIA COLI (A)  Final   Report Status 07/05/2019 FINAL  Final   Organism ID, Bacteria ESCHERICHIA COLI (A)  Final      Susceptibility   Escherichia coli - MIC*    AMPICILLIN 8 SENSITIVE Sensitive     CEFAZOLIN <=4 SENSITIVE Sensitive     CEFTRIAXONE <=1 SENSITIVE Sensitive     CIPROFLOXACIN <=0.25 SENSITIVE Sensitive     GENTAMICIN <=1 SENSITIVE Sensitive     IMIPENEM <=0.25 SENSITIVE Sensitive     NITROFURANTOIN <=16 SENSITIVE Sensitive     TRIMETH/SULFA <=20 SENSITIVE Sensitive     AMPICILLIN/SULBACTAM 4 SENSITIVE Sensitive  PIP/TAZO <=4 SENSITIVE Sensitive     Extended ESBL NEGATIVE Sensitive     * 40,000 COLONIES/mL ESCHERICHIA COLI  SARS Coronavirus 2 [facility] order, Performed in [facility] hospital lab) Nasopharyngeal Nasopharyngeal Swab     Status: None   Collection Time: 07/02/19  8:26 PM   Specimen: Nasopharyngeal Swab  Result Value Ref Range Status   SARS Coronavirus 2 NEGATIVE NEGATIVE Final    Comment: (NOTE) If result is NEGATIVE SARS-CoV-2 target nucleic acids are NOT DETECTED. The SARS-CoV-2 RNA is generally detectable in upper and lower  respiratory specimens during the acute phase of infection. The lowest  concentration of SARS-CoV-2 viral copies this assay can detect is 250  copies / mL. A negative result does not preclude SARS-CoV-2 infection  and should not be used as the sole basis for treatment or other  patient management decisions.  A negative result may occur with  improper specimen collection / handling, submission of specimen other  than nasopharyngeal swab, presence of viral mutation(s) within the  areas targeted by this assay, and inadequate number of viral copies   (<250 copies / mL). A negative result must be combined with clinical  observations, patient history, and epidemiological information. If result is POSITIVE SARS-CoV-2 target nucleic acids are DETECTED. The SARS-CoV-2 RNA is generally detectable in upper and lower  respiratory specimens dur ing the acute phase of infection.  Positive  results are indicative of active infection with SARS-CoV-2.  Clinical  correlation with patient history and other diagnostic information is  necessary to determine patient infection status.  Positive results do  not rule out bacterial infection or co-infection with other viruses. If result is PRESUMPTIVE POSTIVE SARS-CoV-2 nucleic acids MAY BE PRESENT.   A presumptive positive result was obtained on the submitted specimen  and confirmed on repeat testing.  While 2019 novel coronavirus  (SARS-CoV-2) nucleic acids may be present in the submitted sample  additional confirmatory testing may be necessary for epidemiological  and / or clinical management purposes  to differentiate between  SARS-CoV-2 and other Sarbecovirus currently known to infect humans.  If clinically indicated additional testing with an alternate test  methodology [phone]) is advised. The SARS-CoV-2 RNA is generally  detectable in upper and lower respiratory sp ecimens during the acute  phase of infection. The expected result is Negative. Fact Sheet for Patients:  StrictlyIdeas.no Fact Sheet for Healthcare Providers: BankingDealers.co.za This test is not yet approved or cleared by the [place] FDA and has been authorized for detection and/or diagnosis of SARS-CoV-2 by FDA under an Emergency Use Authorization (EUA).  This EUA will remain in effect (meaning this test can be used) for the duration of the COVID-19 declaration under Section 564(b)(1) of the Act, 21 U.S.C. section 360bbb-3(b)(1), unless the authorization is terminated  or revoked sooner. Performed at [facility], [place] [address] [place], [place] [address]   Blood Culture (routine x 2)     Status: Abnormal   Collection Time: 07/02/19  8:42 PM   Specimen: BLOOD RIGHT FOREARM  Result Value Ref Range Status   Specimen Description   Final    BLOOD RIGHT FOREARM Performed at [facility], [place] [address] [place], [place] [address]    Special Requests   Final    BOTTLES DRAWN AEROBIC AND ANAEROBIC Blood Culture results may not be optimal due to an inadequate volume of blood received in culture bottles Performed at [facility] [address], [place], [place] [address]  Culture  Setup Time   Final    IN BOTH AEROBIC AND ANAEROBIC BOTTLES GRAM NEGATIVE RODS CRITICAL RESULT CALLED TO, READ BACK BY AND VERIFIED WITH: Shelda Jakes [facility] 07/03/19 1858 JDW Performed at [facility], [address] [place], [place] [address]    Culture ESCHERICHIA COLI (A)  Final   Report Status 07/05/2019 FINAL  Final   Organism ID, Bacteria ESCHERICHIA COLI  Final      Susceptibility   Escherichia coli - MIC*    AMPICILLIN 8 SENSITIVE Sensitive     CEFAZOLIN <=4 SENSITIVE Sensitive     CEFEPIME <=1 SENSITIVE Sensitive     CEFTAZIDIME <=1 SENSITIVE Sensitive     CEFTRIAXONE <=1 SENSITIVE Sensitive     CIPROFLOXACIN <=0.25 SENSITIVE Sensitive     GENTAMICIN <=1 SENSITIVE Sensitive     IMIPENEM <=0.25 SENSITIVE Sensitive     TRIMETH/SULFA <=20 SENSITIVE Sensitive     AMPICILLIN/SULBACTAM 4 SENSITIVE Sensitive     PIP/TAZO <=4 SENSITIVE Sensitive     Extended ESBL NEGATIVE Sensitive     * ESCHERICHIA COLI  Blood Culture ID Panel (Reflexed)     Status: Abnormal   Collection Time: 07/02/19  8:42 PM  Result Value Ref Range Status   Enterococcus species NOT DETECTED NOT DETECTED Final   Listeria monocytogenes NOT DETECTED NOT DETECTED Final   Staphylococcus species NOT DETECTED NOT DETECTED Final   Staphylococcus aureus  (BCID) NOT DETECTED NOT DETECTED Final   Streptococcus species NOT DETECTED NOT DETECTED Final   Streptococcus agalactiae NOT DETECTED NOT DETECTED Final   Streptococcus pneumoniae NOT DETECTED NOT DETECTED Final   Streptococcus pyogenes NOT DETECTED NOT DETECTED Final   Acinetobacter baumannii NOT DETECTED NOT DETECTED Final   Enterobacteriaceae species DETECTED (A) NOT DETECTED Final    Comment: Enterobacteriaceae represent a large family of gram-negative bacteria, not a single organism. CRITICAL RESULT CALLED TO, READ BACK BY AND VERIFIED WITH: Shelda Jakes [facility] 07/03/19 1858 JDW    Enterobacter cloacae complex NOT DETECTED NOT DETECTED Final   Escherichia coli DETECTED (A) NOT DETECTED Final    Comment: CRITICAL RESULT CALLED TO, READ BACK BY AND VERIFIED WITH: Shelda Jakes [facility] 07/03/19 1858 JDW    Klebsiella oxytoca NOT DETECTED NOT DETECTED Final   Klebsiella pneumoniae NOT DETECTED NOT DETECTED Final   Proteus species NOT DETECTED NOT DETECTED Final   Serratia marcescens NOT DETECTED NOT DETECTED Final   Carbapenem resistance NOT DETECTED NOT DETECTED Final   Haemophilus influenzae NOT DETECTED NOT DETECTED Final   Neisseria meningitidis NOT DETECTED NOT DETECTED Final   Pseudomonas aeruginosa NOT DETECTED NOT DETECTED Final   Candida albicans NOT DETECTED NOT DETECTED Final   Candida glabrata NOT DETECTED NOT DETECTED Final   Candida krusei NOT DETECTED NOT DETECTED Final   Candida parapsilosis NOT DETECTED NOT DETECTED Final   Candida tropicalis NOT DETECTED NOT DETECTED Final    Comment: Performed at [facility], [place] [address] [place], [place] [address]  Blood Culture (routine x 2)     Status: None (Preliminary result)   Collection Time: 07/02/19  8:52 PM   Specimen: BLOOD  Result Value Ref Range Status   Specimen Description   Final    BLOOD RIGHT ANTECUBITAL Performed at [facility] [address] [place], [place] [address]    Special  Requests   Final    BOTTLES DRAWN AEROBIC AND ANAEROBIC Blood Culture results may not be optimal due to an inadequate volume  of blood received in culture bottles Performed at [facility], [place] [address] [place], [place] [address]    Culture  Setup Time   Final    GRAM NEGATIVE RODS IN BOTH AEROBIC AND ANAEROBIC BOTTLES CRITICAL VALUE NOTED.  VALUE IS CONSISTENT WITH PREVIOUSLY REPORTED AND CALLED VALUE. Performed at [facility], [place] [address], [place], [place] [address]    Culture GRAM NEGATIVE RODS  Final   Report Status PENDING  Incomplete  MRSA PCR Screening     Status: None   Collection Time: 07/03/19  4:00 PM   Specimen: Nasopharyngeal  Result Value Ref Range Status   MRSA by PCR NEGATIVE NEGATIVE Final    Comment:        The GeneXpert MRSA Assay (FDA approved for NASAL specimens only), is one component of a comprehensive MRSA colonization surveillance program. It is not intended to diagnose MRSA infection nor to guide or monitor treatment for MRSA infections. Performed at [facility], [place] [address], [place], [place] [address]      Labs: BNP (last 3 results) No results for input(s): BNP in the last 8760 hours. Basic Metabolic Panel: Recent Labs  Lab 07/02/19 2041 07/03/19 0406 07/03/19 0851 07/04/19 0159 07/05/19 0207 07/06/19 0534  NA 138 141  --  142 140 138  K 2.7* 3.1*  --  3.5 3.3* 4.3  CL 102 110  --  114* 112* 109  CO2 23 22  --  22 21* 21*  GLUCOSE 135* 95  --  87 116* 96  BUN 19 16  --  10 10 11   CREATININE 0.76 0.68  --  0.43* 0.35* 0.38*  CALCIUM 9.0 8.2*  --  8.2* 8.1* 8.5*  MG 2.1 1.9 1.9  --  1.9  --   PHOS 2.5 3.5  --   --   --   --    Liver Function Tests: Recent Labs  Lab 07/02/19 2041 07/03/19 0406  AST 103* 126*  ALT 45* 47*  ALKPHOS 99 86  BILITOT 0.7 0.4  PROT 6.4* 5.8*  ALBUMIN 4.0 3.3*   No results for input(s): LIPASE, AMYLASE in the last 168 hours. No results for  input(s): AMMONIA in the last 168 hours. CBC: Recent Labs  Lab 07/02/19 2041 07/03/19 0406 07/04/19 0159 07/05/19 0207 07/06/19 0534  WBC 15.4* 17.3* 14.4* 12.1* 7.8  NEUTROABS 14.2*  --   --  10.6* 5.7  HGB 11.7* 10.9* 10.6* 10.5* 11.4*  HCT 35.6* 33.3* 33.0* 32.5* 36.1  MCV 91.5 91.7 93.2 92.9 92.8  PLT 141* 142* 137* 144* 174   Cardiac Enzymes: Recent Labs  Lab 07/02/19 2041 07/03/19 0406 07/03/19 1313 07/05/19 1135 07/06/19 0534  CKTOTAL 3,176* 5,242* 5,011* 663* 332*   BNP: Invalid input(s): POCBNP CBG: No results for input(s): GLUCAP in the last 168 hours. D-Dimer No results for input(s): DDIMER in the last 72 hours. Hgb A1c No results for input(s): HGBA1C in the last 72 hours. Lipid Profile No results for input(s): CHOL, HDL, LDLCALC, TRIG, CHOLHDL, LDLDIRECT in the last 72 hours. Thyroid function studies No results for input(s): TSH, T4TOTAL, T3FREE, THYROIDAB in the last 72 hours.  Invalid input(s): FREET3 Anemia work up No results for input(s): VITAMINB12, FOLATE, FERRITIN, TIBC, IRON, RETICCTPCT in the last 72 hours. Urinalysis    Component Value Date/Time   COLORURINE YELLOW 07/02/2019 2000   APPEARANCEUR HAZY (A) 07/02/2019 2000   LABSPEC 1.006 07/02/2019 2000   PHURINE 5.0 07/02/2019  [place] 07/02/2019 2000   [place] (A) 07/02/2019 [place] NEGATIVE 07/02/2019 2000   KETONESUR 5 (A) 07/02/2019 [place] 07/02/2019 2000   UROBILINOGEN 1 10/15/2013 1501   NITRITE POSITIVE (A) 07/02/2019 2000   LEUKOCYTESUR LARGE (A) 07/02/2019 2000   Sepsis Labs Invalid input(s): PROCALCITONIN,  WBC,  LACTICIDVEN Microbiology Recent Results (from the past 240 hour(s))  Urine culture     Status: Abnormal   Collection Time: 07/02/19  8:00 PM   Specimen: In/Out Cath Urine  Result Value Ref Range Status   Specimen Description   Final    IN/OUT CATH URINE Performed at [facility], [place]  [address] [place], [place] [address]    Special Requests   Final    NONE Performed at [facility], [place] [address], [place], [place] [address]    Culture 40,000 COLONIES/mL ESCHERICHIA COLI (A)  Final   Report Status 07/05/2019 FINAL  Final   Organism ID, Bacteria ESCHERICHIA COLI (A)  Final      Susceptibility   Escherichia coli - MIC*    AMPICILLIN 8 SENSITIVE Sensitive     CEFAZOLIN <=4 SENSITIVE Sensitive     CEFTRIAXONE <=1 SENSITIVE Sensitive     CIPROFLOXACIN <=0.25 SENSITIVE Sensitive     GENTAMICIN <=1 SENSITIVE Sensitive     IMIPENEM <=0.25 SENSITIVE Sensitive     NITROFURANTOIN <=16 SENSITIVE Sensitive     TRIMETH/SULFA <=20 SENSITIVE Sensitive     AMPICILLIN/SULBACTAM 4 SENSITIVE Sensitive     PIP/TAZO <=4 SENSITIVE Sensitive     Extended ESBL NEGATIVE Sensitive     * 40,000 COLONIES/mL ESCHERICHIA COLI  SARS Coronavirus 2 [facility] order, Performed in [facility] hospital lab) Nasopharyngeal Nasopharyngeal Swab     Status: None   Collection Time: 07/02/19  8:26 PM   Specimen: Nasopharyngeal Swab  Result Value Ref Range Status   SARS Coronavirus 2 NEGATIVE NEGATIVE Final    Comment: (NOTE) If result is NEGATIVE SARS-CoV-2 target nucleic acids are NOT DETECTED. The SARS-CoV-2 RNA is generally detectable in upper and lower  respiratory specimens during the acute phase of infection. The lowest  concentration of SARS-CoV-2 viral copies this assay can detect is 250  copies / mL. A negative result does not preclude SARS-CoV-2 infection  and should not be used as the sole basis for treatment or other  patient management decisions.  A negative result may occur with  improper specimen collection / handling, submission of specimen other  than nasopharyngeal swab, presence of viral mutation(s) within the  areas targeted by this assay, and inadequate number of viral copies  (<250 copies / mL). A negative result must be combined with clinical  observations,  patient history, and epidemiological information. If result is POSITIVE SARS-CoV-2 target nucleic acids are DETECTED. The SARS-CoV-2 RNA is generally detectable in upper and lower  respiratory specimens dur ing the acute phase of infection.  Positive  results are indicative of active infection with SARS-CoV-2.  Clinical  correlation with patient history and other diagnostic information is  necessary to determine patient infection status.  Positive results do  not rule out bacterial infection or co-infection with other viruses. If result is PRESUMPTIVE POSTIVE SARS-CoV-2 nucleic acids MAY BE PRESENT.   A presumptive positive result was obtained on the submitted specimen  and confirmed on repeat testing.  While 2019 novel coronavirus  (SARS-CoV-2) nucleic acids may be present in the submitted sample  additional confirmatory  testing may be necessary for epidemiological  and / or clinical management purposes  to differentiate between  SARS-CoV-2 and other Sarbecovirus currently known to infect humans.  If clinically indicated additional testing with an alternate test  methodology [phone]) is advised. The SARS-CoV-2 RNA is generally  detectable in upper and lower respiratory sp ecimens during the acute  phase of infection. The expected result is Negative. Fact Sheet for Patients:  StrictlyIdeas.no Fact Sheet for Healthcare Providers: BankingDealers.co.za This test is not yet approved or cleared by the [place] FDA and has been authorized for detection and/or diagnosis of SARS-CoV-2 by FDA under an Emergency Use Authorization (EUA).  This EUA will remain in effect (meaning this test can be used) for the duration of the COVID-19 declaration under Section 564(b)(1) of the Act, 21 U.S.C. section 360bbb-3(b)(1), unless the authorization is terminated or revoked sooner. Performed at [facility], [place] [address] [place], [place] [address]   Blood Culture (routine x 2)     Status: Abnormal   Collection Time: 07/02/19  8:42 PM   Specimen: BLOOD RIGHT FOREARM  Result Value Ref Range Status   Specimen Description   Final    BLOOD RIGHT FOREARM Performed at [facility], [place] [address] [place], [place] [address]    Special Requests   Final    BOTTLES DRAWN AEROBIC AND ANAEROBIC Blood Culture results may not be optimal due to an inadequate volume of blood received in culture bottles Performed at [place] [address], [place], [place] [address]    Culture  Setup Time   Final    IN BOTH AEROBIC AND ANAEROBIC BOTTLES GRAM NEGATIVE RODS CRITICAL RESULT CALLED TO, READ BACK BY AND VERIFIED WITH: Shelda Jakes [facility] 07/03/19 1858 JDW Performed at [facility], [place] [address], [place], [place] [address]    Culture ESCHERICHIA COLI (A)  Final   Report Status 07/05/2019 FINAL  Final   Organism ID, Bacteria ESCHERICHIA COLI  Final      Susceptibility   Escherichia coli - MIC*    AMPICILLIN 8 SENSITIVE Sensitive     CEFAZOLIN <=4 SENSITIVE Sensitive     CEFEPIME <=1 SENSITIVE Sensitive     CEFTAZIDIME <=1 SENSITIVE Sensitive     CEFTRIAXONE <=1 SENSITIVE Sensitive     CIPROFLOXACIN <=0.25 SENSITIVE Sensitive     GENTAMICIN <=1 SENSITIVE Sensitive     IMIPENEM <=0.25 SENSITIVE Sensitive     TRIMETH/SULFA <=20 SENSITIVE Sensitive     AMPICILLIN/SULBACTAM 4 SENSITIVE Sensitive     PIP/TAZO <=4 SENSITIVE Sensitive     Extended ESBL NEGATIVE Sensitive     * ESCHERICHIA COLI  Blood Culture ID Panel (Reflexed)     Status: Abnormal   Collection Time: 07/02/19  8:42 PM  Result Value Ref Range Status   Enterococcus species NOT DETECTED NOT DETECTED Final   Listeria monocytogenes NOT DETECTED NOT DETECTED Final   Staphylococcus species NOT DETECTED NOT DETECTED Final   Staphylococcus aureus (BCID) NOT DETECTED NOT DETECTED Final   Streptococcus species NOT DETECTED NOT  DETECTED Final   Streptococcus agalactiae NOT DETECTED NOT DETECTED Final   Streptococcus pneumoniae NOT DETECTED NOT DETECTED Final   Streptococcus pyogenes NOT DETECTED NOT DETECTED Final   Acinetobacter baumannii NOT DETECTED NOT DETECTED Final   Enterobacteriaceae species DETECTED (A) NOT DETECTED Final    Comment: Enterobacteriaceae represent a large family of gram-negative bacteria, not a single organism. CRITICAL RESULT CALLED TO, READ BACK  BY AND VERIFIED WITH: Shelda Jakes [facility] 07/03/19 1858 JDW    Enterobacter cloacae complex NOT DETECTED NOT DETECTED Final   Escherichia coli DETECTED (A) NOT DETECTED Final    Comment: CRITICAL RESULT CALLED TO, READ BACK BY AND VERIFIED WITH: Shelda Jakes [facility] 07/03/19 1858 JDW    Klebsiella oxytoca NOT DETECTED NOT DETECTED Final   Klebsiella pneumoniae NOT DETECTED NOT DETECTED Final   Proteus species NOT DETECTED NOT DETECTED Final   Serratia marcescens NOT DETECTED NOT DETECTED Final   Carbapenem resistance NOT DETECTED NOT DETECTED Final   Haemophilus influenzae NOT DETECTED NOT DETECTED Final   Neisseria meningitidis NOT DETECTED NOT DETECTED Final   Pseudomonas aeruginosa NOT DETECTED NOT DETECTED Final   Candida albicans NOT DETECTED NOT DETECTED Final   Candida glabrata NOT DETECTED NOT DETECTED Final   Candida krusei NOT DETECTED NOT DETECTED Final   Candida parapsilosis NOT DETECTED NOT DETECTED Final   Candida tropicalis NOT DETECTED NOT DETECTED Final    Comment: Performed at [facility], [place] [address], [place], [place] [address]  Blood Culture (routine x 2)     Status: None (Preliminary result)   Collection Time: 07/02/19  8:52 PM   Specimen: BLOOD  Result Value Ref Range Status   Specimen Description   Final    BLOOD RIGHT ANTECUBITAL Performed at [place] [address], [place], [place] [address]    Special Requests   Final    BOTTLES DRAWN AEROBIC AND ANAEROBIC Blood Culture results may not  be optimal due to an inadequate volume of blood received in culture bottles Performed at [place] [address], [place], [place] [address]    Culture  Setup Time   Final    GRAM NEGATIVE RODS IN BOTH AEROBIC AND ANAEROBIC BOTTLES CRITICAL VALUE NOTED.  VALUE IS CONSISTENT WITH PREVIOUSLY REPORTED AND CALLED VALUE. Performed at [facility], [place] [address] [place], [place] [address]    Culture GRAM NEGATIVE RODS  Final   Report Status PENDING  Incomplete  MRSA PCR Screening     Status: None   Collection Time: 07/03/19  4:00 PM   Specimen: Nasopharyngeal  Result Value Ref Range Status   MRSA by PCR NEGATIVE NEGATIVE Final    Comment:        The GeneXpert MRSA Assay (FDA approved for NASAL specimens only), is one component of a comprehensive MRSA colonization surveillance program. It is not intended to diagnose MRSA infection nor to guide or monitor treatment for MRSA infections. Performed at [facility], [place] [address], [place], [place] [address]      Time coordinating discharge: 45 minutes  SIGNED:   Tawni Millers, MD  [facility] 07/06/2019, 10:22 AM

## 2019-07-06 NOTE — Progress Notes (Signed)
Per RN, pt now requesting RW. CM attempted to call pt in room with no answer. MD order in. Zack, [employer] rep, given referral and anticipates delivery to pt room in approx 30 mins.

## 2019-07-06 NOTE — Progress Notes (Signed)
Spoke with pt about DC care needs- she has declined SNF, reports anxious to be home with husband and family nearby.  Pt also declined any home health services. She reports having shower chair, walker, cane, and BC at home already therefore declines any DME. States she is expecting her son and family nearby to help her and her husband at DC, pt's husband has dementia and she typically assists him with ADLs. CSW advised please reach out to [facility] team prior to DC if she finds herself agreeable to seeking HH. Orders are placed, also advised she can reach out to her op provider for assistance if she discovers needs once home. Pt very gracious and agreeable.  [name], MSW, LCSW Transitions of Care 07/06/2019 [phone]

## 2019-07-06 NOTE — Care Management Important Message (Signed)
Important Message  Patient Details IM Letter given to [name] SW to present to the Patient Name: Shelly Barrett MRN: [id number] Date of Birth: 1941-06-23   Medicare Important Message Given:  Yes     Kerin Salen 07/06/2019, 10:50 AM

## 2019-07-07 LAB — CULTURE, BLOOD (ROUTINE X 2)

## 2019-07-10 ENCOUNTER — Telehealth: Payer: Self-pay | Admitting: *Deleted

## 2019-07-10 NOTE — Telephone Encounter (Signed)
Called patient on 07/10/2019 , 12:20 PM in an attempt to reach the patient for a hospital follow up.   Admit date: 07/02/19 Discharge: 07/06/19   She does not have any questions or concerns about medications from the hospital admission. The patient's medications were reviewed over the phone, they were counseled to bring in all current medications to the hospital follow up visit.   I advised the patient to call if any questions or concerns arise about the hospital admission or medications    Home health was not started in the hospital. Patient has a sitter in her home to help her and spouse, since she is weak. All questions were answered and a follow up appointment was made. An appointment was made for her follow up for 07/19/2019 with Garnet Sierras, NP.  Prior to Admission medications   Medication Sig Start Date End Date Taking? Authorizing Provider  acetaminophen (TYLENOL) 325 MG tablet Take 2 tablets (650 mg total) by mouth every 6 (six) hours as needed for mild pain (or Fever >/= 101). 07/06/19   Arrien, Jimmy Picket, MD  Amino Acids-Protein Hydrolys (FEEDING SUPPLEMENT, PRO-STAT SUGAR FREE 64,) LIQD Take 30 mLs by mouth 2 (two) times daily. 07/06/19 08/05/19  Arrien, Jimmy Picket, MD  CALCIUM PO Take 250 mg by mouth.    [provider]  cephALEXin (KEFLEX) 500 MG capsule Take 1 capsule (500 mg total) by mouth every 12 (twelve) hours for 10 days. 07/06/19 07/16/19  Arrien, Jimmy Picket, MD  cholecalciferol (VITAMIN D) 1000 UNITS tablet Take 2,000 Units by mouth daily.     [provider]  Multiple Vitamin (MULTIVITAMIN WITH MINERALS) TABS tablet Take 1 tablet by mouth daily. 07/06/19 08/05/19  Arrien, Jimmy Picket, MD

## 2019-07-18 NOTE — Progress Notes (Signed)
Hospital follow up  Assessment and Plan: Hospital visit follow up for:   [name] was seen today for hospitalization follow-up.  Diagnoses and all orders for this visit:  Sepsis due to Escherichia coli, unspecified whether acute organ dysfunction present ([place]) Has compelted keflex course; denies symptoms; denies recheck CBC, UA today Resolved;  Emphasized hygiene, fluid intake Call office with any dysuria, frequency, urgency, incontinence, fever/chills  Non-traumatic rhabdomyolysis Resolving at discharge; recommended recheck renal functions which she declines today She agrees to schedule close follow up within the month and will follow up with labs at that time   Hypokalemia Corrected prior to discharge; WNL last check; monitor   Ileus ([place]) Resolved with observation and fluid supplementation Avoid constipating agents; known tortuous bowel Having daily BMs, denies nausea; endorses good appetite; resolved  Pressure injury of buttock, stage 2, unspecified laterality ([place]) Per patient chronic; appears red discoloration without breakdown Continue donut pillow, frequent position changes hourly Reviewed high protein and calorie diet, recommended small frequent meals; protein supplemented drinks x 2 daily  Add vitamin C 500 mg, zinc 30-40 mg daily  Monitor; follow up 1 month  Malnutrition of mild degree ([place]) She has not tolerated numerous nutritional supplements;  Reviewed high protein and calorie diet, recommended small frequent meals; protein supplemented drinks x 2 daily  Goal set for weight gain; short term 90 lb, long term 120 lb Declines remeron or other medication to encourage weight gain  Body mass index (BMI) less than 16.5   SHE DECLINES ALL LABS TODAY   All medications were reviewed with patient and family and fully reconciled. All questions answered fully, and patient and family members were encouraged to call the office with any further questions or concerns.  Discussed goal to avoid readmission related to this diagnosis.  Medications Discontinued During This Encounter  Medication Reason  . Amino Acids-Protein Hydrolys (FEEDING SUPPLEMENT, PRO-STAT SUGAR FREE 64,) LIQD Patient Preference    Over 40 minutes of exam, counseling, chart review, and complex, high/moderate level critical decision making was performed this visit.   Future Appointments  Date Time Provider [place]  10/25/2019 11:15 AM Liane Comber, NP [facility] None  05/01/2020  2:00 PM [name], MD [facility] None     HPI 78 y.o.female presents for follow up for transition from recent hospitalization or SNIF stay. Admit date to the hospital was 07/02/19, patient was discharged from the hospital on 07/06/19 and our clinical staff contacted the office the day after discharge to set up a follow up appointment. The discharge summary, medications, and diagnostic test results were reviewed before meeting with the patient. The patient was admitted for:   Discharge Diagnoses:  Active Problems:   Hypertension   Hyperlipidemia   Malnutrition of mild degree (HCC)   Scoliosis of thoracolumbar spine   Sepsis (HCC)   Ileus (HCC)   Hypokalemia   Rhabdomyolysis   Dehydration   Normocytic anemia   Hypotension   Elevated troponin   Acute lower UTI   E coli bacteremia   Pressure injury of skin  Admitted From: Home  Disposition:  Home   Recommendations for Outpatient Follow-up and new medication changes:  1. Follow up with Dr. Melford Aase in 7 days.  2. Continue antibiotic therapy with cephalexin for 10 days.  3. Patient declined SNF.   Home Health: yes   Equipment/Devices: walker     Brief/Interim Summary: 78 year old female who presented to the ED with a mechanical fall and generalized weakness. Her past medical history includes dyslipidemia,  arthritis, scoliosis and diverticulosis. Patient had been suffering from constipation, had to manually disimpact herself.  On the day of admission she she had a large amount of liquid stool then slipped downwhile being on the toilet, no syncope, she was very weak, unable to stand, she spent about 2 hours on the floor. Her son brought her to the hospital for further evaluation. On her initial physical examination her blood pressure was 97/46, heart rate 78, respiratory rate 22, temperature 99.7, oxygen saturation 97%. She was ill appearing, dry mucous membranes, her lungs were clear to auscultation heart S1-S2 present and regular, the abdomen was soft, no lower extremity edema. Sodium 138, potassium 2.7, chloride 102, bicarb 23, glucose 135, BUN 19, creatinine 0.76, phosphorus 2.5, magnesium 2.1, total CK 3176,white count 15.4, hemoglobin 11.7, hematocrit 35.6, platelets 141.SARS COVID-19 was negative. Her urinalysis had positive nitrates, 6-10 red cells and 21-50 white cells. Her chest radiograph had no infiltrates. Abdominal film with mildly dilated small bowel in the pelvis and left lower abdomen, ileus versus early small bowel obstruction. EKG 105 bpm, normal axis, prolonged QTC 519, sinus rhythm, ST segment depression V5 V6, no significant T wave changes.  Patient was admitted to the hospital with a working diagnosis of sepsis due to urinarytract infection, complicated by ileus, hypokalemia and rhabdomyolysis.  Blood cultures and urine positive for E coli pan-sensitive, antibiotic therapy narrowed with good toleration. Patient very weak and deconditioned, she declined SNF placement.   1.  Sepsis due to urinary tract infection due to E. coli, complicated by gram-negative bacteremia and troponin elevation present on admission.   Patient was admitted to the stepdown unit, she received intravenous fluids and broad-spectrum IV antibiotic therapy.  Her blood cultures and urine cultures came back positive for E. coli which was pansensitive.  Antibiotic therapy was narrowed to intravenous ceftriaxone with good  toleration.  She responded well to the medical therapy and eventually transition to oral cephalexin, 500 mg twice daily.  Serial EKG negative for ischemic changes, troponin elevation related to sepsis, acute coronary syndrome was ruled out.  She has completed cephalexin 500 mg BID course. She denies fever/chills, dysuria, urgency. She reports back to baseline for her.   Lab Results  Component Value Date   WBC 7.8 07/06/2019   HGB 11.4 (L) 07/06/2019   HCT 36.1 07/06/2019   MCV 92.8 07/06/2019   PLT 174 07/06/2019    2.  Ileus.   Initially patient was kept nothing by mouth, she received IV fluids and as needed antiemetics.  Her symptoms improved and her diet was advanced progressively with good toleration.  Patient did not require nasogastric tube decompression. She reported loose stools that have been more formed by the time of discharge.   Lab Results  Component Value Date   NA 138 07/06/2019   K 4.3 07/06/2019   CL 109 07/06/2019   CO2 21 (L) 07/06/2019   GLUCOSE 96 07/06/2019   BUN 11 07/06/2019   CREATININE 0.38 (L) 07/06/2019   CALCIUM 8.5 (L) 07/06/2019   GFRAA >60 07/06/2019   GFRNONAA >60 07/06/2019   Lab Results  Component Value Date   ALT 47 (H) 07/03/2019   AST 126 (H) 07/03/2019   ALKPHOS 86 07/03/2019   BILITOT 0.4 07/03/2019   She reports since discharge she is able to keep water down; she has been eating oatmeal with walnuts; she admits she has been unable to tolerate the recommended nutritional supplement and stopped taking. She reports lactose intolerance and hasn't  tolerated boost or ensure in the past. She has been having 3 meals daily. She denies nausea, emesis; she reports she is having BMs most every day. She denies blood in stools.   3.  Rhabdomyolysis with hypokalemia/hypomagnesemia, non-gap metabolic acidosis.  Patient received intravenous balanced electrolytes fluids with good toleration.  No signs of volume overload while hospitalized.  Her potassium  and magnesium were corrected with potassium chloride and magnesium sulfate.  CK peaked at 5,011, by the time of discharge is down to 332.  Her kidney function remained stable with a serum creatinine of 0.38-0.35, her discharge potassium was 4.3, serum bicarbonate of 21.  Lab Results  Component Value Date   CREATININE 0.38 (L) 07/06/2019   CREATININE 0.35 (L) 07/05/2019   CREATININE 0.43 (L) 07/04/2019   Lab Results  Component Value Date   CKTOTAL 332 (H) 07/06/2019    4.  Chronic anemia of iron deficiency.  Iron stores revealed serum iron of 9, TIBC 311, transferrin saturation of 3 and ferritin 140.  Recommendations for IV iron as outpatient once infection resolved.   She declines referral for iron. Likely would not tolerate oral supplementation well with recent hx of ileus, known tortuous bowels. Reviewed oral sources - tofu 1 cup, red meat, spinach/green leavfy  Lab Results  Component Value Date   IRON 9 (L) 07/03/2019   TIBC 311 07/03/2019   FERRITIN 140 07/03/2019   Lab Results  Component Value Date   WBC 7.8 07/06/2019   HGB 11.4 (L) 07/06/2019   HCT 36.1 07/06/2019   MCV 92.8 07/06/2019   PLT 174 07/06/2019    5.  Moderate calorie protein malnutrition.  Patient received nutritional supplements with good toleration in the hospital; she reports did lot tolerate prescribed protein supplement. She has tried boost/ensure; doesn't care for but can tolerate with lactose supplement. Alternative options and ideal food sources discussed at length including tofu/soy, beans, nuts/seeds, low fat dairy (which she can tolerate with lactose supplement). She may try vegan protein supplement drinks from grocery.   BMI is Body mass index is 15.13 kg/m. Wt Readings from Last 3 Encounters:  07/19/19 85 lb 6.4 oz (38.7 kg)  07/03/19 83 lb (37.6 kg)  04/19/19 83 lb 6.4 oz (37.8 kg)   6.  Stage II sacral pressure ulcer, present on admission.  Continue local wound care.  Per patient this is  not new; chronic; she is sitting on donut pillow and getting up to ambulate and change positions hourly. She is not on vitamin C or zinc supplements.    Home health is not involved.   Images while in the hospital: Dg Abd 1 View  Result Date: 07/03/2019 CLINICAL DATA:  Ileus. EXAM: ABDOMEN - 1 VIEW COMPARISON:  07/02/2019. FINDINGS: Soft tissue structures are unremarkable. Air-filled loops of small and large bowel noted. Small-bowel distention has improved from prior exam. No free air identified. Lumbar spine scoliosis concave right. Diffuse osteopenia and degenerative change. IMPRESSION: Nondistended air-filled loops of small and large bowel noted. Small-bowel distention has improved from prior exam. Findings suggest adynamic ileus. To demonstrate resolution and to exclude bowel obstruction continued follow-up exam suggested. Electronically Signed   By: Marcello Moores  Register   On: 07/03/2019 10:43   Dg Abd 1 View  Result Date: 07/02/2019 CLINICAL DATA:  Obstipation. EXAM: ABDOMEN - 1 VIEW COMPARISON:  CT 12/13/2017 FINDINGS: No evidence of free air on single-view. Mildly dilated small bowel in the pelvis and left lower abdomen measuring 3 cm. No significant formed  stool in the colon. No radiopaque calculi or abnormal soft tissue calcifications. Scoliotic curvature in degenerative change of the spine. The bones are under mineralized. Included lung bases are clear. IMPRESSION: Mildly dilated small bowel in the pelvis and left lower abdomen, ileus versus early small bowel obstruction. Electronically Signed   By: Keith Rake M.D.   On: 07/02/2019 23:48   Dg Chest Port 1 View  Result Date: 07/02/2019 CLINICAL DATA:  Shortness of breath today. Status post fall this morning. EXAM: PORTABLE CHEST 1 VIEW COMPARISON:  None. FINDINGS: Lungs are clear. Heart size is normal. No pneumothorax or pleural effusion. Scoliosis noted. No acute bony abnormality. IMPRESSION: No acute disease. Electronically Signed   By:  Inge Rise M.D.   On: 07/02/2019 20:39        Current Outpatient Medications (Analgesics):  .  acetaminophen (TYLENOL) 325 MG tablet, Take 2 tablets (650 mg total) by mouth every 6 (six) hours as needed for mild pain (or Fever >/= 101).   Current Outpatient Medications (Other):  Marland Kitchen  CALCIUM PO, Take 250 mg by mouth. .  cholecalciferol (VITAMIN D) 1000 UNITS tablet, Take 2,000 Units by mouth daily.  .  Multiple Vitamin (MULTIVITAMIN WITH MINERALS) TABS tablet, Take 1 tablet by mouth daily.  Past Medical History:  Diagnosis Date  . Arthritis   . Hyperlipidemia   . Ileus ([place]) 07/03/2019  . Left femoral hernia without obstruction or gangrene 12/14/2017   On CT 12/13/2017  . Prediabetes   . Scoliosis   . Sepsis ([place]) 07/02/2019  . Sigmoid diverticulitis   . Vitamin D deficiency      Allergies  Allergen Reactions  . Betadine [Povidone Iodine]   . Ciprocinonide [Fluocinolone]   . Ciprofloxacin Hcl     Hives  . Flexeril [Cyclobenzaprine]   . Ibuprofen   . Lactose Intolerance ([place])     Diarrhea, cramping  . Macrobid [Nitrofurantoin Macrocrystal] Diarrhea, Nausea And Vomiting and Swelling  . Naprosyn [Naproxen]   . Penicillins     REACTION: rash Documentation from [place] in 2012 patient was on zosyn then on keflex without known adverse reaction  . Shellfish Allergy   . Sodium Benzoate [Nutritional Supplements]   . Iodinated Diagnostic Agents Rash    ROS: all negative except above.   Physical Exam: Filed Weights   07/19/19 0911  Weight: 85 lb 6.4 oz (38.7 kg)   BP 138/80   Pulse 86   Wt 85 lb 6.4 oz (38.7 kg)   SpO2 99%   BMI 15.13 kg/m  General Appearance: Frail, very thin elder, well dressed, in no apparent distress. Eyes: PERRLA, EOMs, conjunctiva no swelling or erythema Sinuses: No Frontal/maxillary tenderness ENT/Mouth: Ext aud canals clear, TMs without erythema, bulging. No erythema, swelling, or exudate on post pharynx.  Tonsils not swollen or  erythematous. Hearing normal.  Neck: Supple, thyroid normal.  Respiratory: Respiratory effort normal, BS equal bilaterally without rales, rhonchi, wheezing or stridor.  Cardio: RRR with no MRGs. Brisk peripheral pulses without edema.  Abdomen: Soft, + BS.  Non tender, no guarding, rebound, hernias, masses. Lymphatics: Non tender without lymphadenopathy.  Musculoskeletal: Slow sitting to standing, 4/5 strength throughout, slow steady gait.  Skin: Warm, dry without lesions, ecchymosis; she has reddened discoloration to sacrum that does blanch, without breakdown of skin.   Neuro: Poor muscle tone, no cerebellar symptoms. Sensation intact.  Psych: Awake and oriented X 3, normal affect, Insight and Judgment fair.     Izora Ribas, NP 10:17  AM [facility]

## 2019-07-19 ENCOUNTER — Other Ambulatory Visit: Payer: Self-pay

## 2019-07-19 ENCOUNTER — Ambulatory Visit: Payer: PPO | Admitting: Adult Health Nurse Practitioner

## 2019-07-19 ENCOUNTER — Ambulatory Visit (INDEPENDENT_AMBULATORY_CARE_PROVIDER_SITE_OTHER): Payer: PPO | Admitting: Adult Health

## 2019-07-19 ENCOUNTER — Encounter: Payer: Self-pay | Admitting: Adult Health

## 2019-07-19 VITALS — BP 138/80 | HR 86 | Wt 85.4 lb

## 2019-07-19 DIAGNOSIS — E876 Hypokalemia: Secondary | ICD-10-CM | POA: Diagnosis not present

## 2019-07-19 DIAGNOSIS — E441 Mild protein-calorie malnutrition: Secondary | ICD-10-CM | POA: Diagnosis not present

## 2019-07-19 DIAGNOSIS — A4151 Sepsis due to Escherichia coli [E. coli]: Secondary | ICD-10-CM

## 2019-07-19 DIAGNOSIS — K567 Ileus, unspecified: Secondary | ICD-10-CM

## 2019-07-19 DIAGNOSIS — L89302 Pressure ulcer of unspecified buttock, stage 2: Secondary | ICD-10-CM

## 2019-07-19 DIAGNOSIS — Z681 Body mass index (BMI) 19 or less, adult: Secondary | ICD-10-CM

## 2019-07-19 DIAGNOSIS — M6282 Rhabdomyolysis: Secondary | ICD-10-CM

## 2019-07-19 NOTE — Patient Instructions (Addendum)
Goals    . Gain weight       Food sources with high iron: tofu/soy products, spinach/green leafy, lentils (most beans)  Consume with high vitamin C foods for optimal absorption - can take a tablet - 250 mg- 500 mg with each meal - or fruit, bell peppers, lemon/limes/citrus   Please add a zinc supplement - 30-40 mg daily   Please use  a donut pillow, get up and walk or stand for 5 min; every 2 hours try to take a different position      High-Protein and High-Calorie Diet Eating high-protein and high-calorie foods can help you to gain weight, heal after an injury, and recover after an illness or surgery. The specific amount of daily protein and calories you need depends on:  Your body weight.  The reason this diet is recommended for you. What is my plan? Generally, a high-protein, high-calorie diet involves:  Eating 250-500 extra calories each day.  Making sure that you get enough of your daily calories from protein. Ask your health care provider how many of your calories should come from protein. Talk with a health care provider, such as a diet and nutrition specialist (dietitian), about how much protein and how many calories you need each day. Follow the diet as directed by your health care provider. What are tips for following this plan?  Preparing meals  Add whole milk, half-and-half, or heavy cream to cereal, pudding, soup, or hot cocoa.  Add whole milk to instant breakfast drinks.  Add peanut butter to oatmeal or smoothies.  Add powdered milk to baked goods, smoothies, or milkshakes.  Add powdered milk, cream, or butter to mashed potatoes.  Add cheese to cooked vegetables.  Make whole-milk yogurt parfaits. Top them with granola, fruit, or nuts.  Add cottage cheese to your fruit.  Add avocado, cheese, or both to sandwiches or salads.  Add meat, poultry, or seafood to rice, pasta, casseroles, salads, and soups.  Use mayonnaise when making egg salad, chicken  salad, or tuna salad.  Use peanut butter as a dip for vegetables or as a topping for pretzels, celery, or crackers.  Add beans to casseroles, dips, and spreads.  Add pureed beans to sauces and soups.  Replace calorie-free drinks with calorie-containing drinks, such as milk and fruit juice.  Replace water with milk or heavy cream when making foods such as oatmeal, pudding, or cocoa. General instructions  Ask your health care provider if you should take a nutritional supplement.  Try to eat six small meals each day instead of three large meals.  Eat a balanced diet. In each meal, include one food that is high in protein.  Keep nutritious snacks available, such as nuts, trail mixes, dried fruit, and yogurt.  If you have kidney disease or diabetes, talk with your health care provider about how much protein is safe for you. Too much protein may put extra stress on your kidneys.  Drink your calories. Choose high-calorie drinks and have them after your meals. What high-protein foods should I eat?  Vegetables Soybeans. Peas. Grains Quinoa. Bulgur wheat. Meats and other proteins Beef, pork, and poultry. Fish and seafood. Eggs. Tofu. Textured vegetable protein (TVP). Peanut butter. Nuts and seeds. Dried beans. Protein powders. Dairy Whole milk. Whole-milk yogurt. Powdered milk. Cheese. [employer]. Eggnog. Beverages High-protein supplement drinks. Soy milk. Other foods Protein bars. The items listed above may not be a complete list of high-protein foods and beverages. Contact a dietitian for more options. What  high-calorie foods should I eat? Fruits Dried fruit. Fruit leather. Canned fruit in syrup. Fruit juice. Avocado. Vegetables Vegetables cooked in oil or butter. Fried potatoes. Grains Pasta. Quick breads. Muffins. Pancakes. Ready-to-eat cereal. Meats and other proteins Peanut butter. Nuts and seeds. Dairy Heavy cream. Whipped cream. Cream cheese. Sour cream. Ice cream.  Custard. Pudding. Beverages Meal-replacement beverages. Nutrition shakes. Fruit juice. Sugar-sweetened soft drinks. Seasonings and condiments Salad dressing. Mayonnaise. Alfredo sauce. Fruit preserves or jelly. Honey. Syrup. Sweets and desserts Cake. Cookies. Pie. Pastries. Candy bars. Chocolate. Fats and oils Butter or margarine. Oil. Gravy. Other foods Meal-replacement bars. The items listed above may not be a complete list of high-calorie foods and beverages. Contact a dietitian for more options. Summary  A high-protein, high-calorie diet can help you gain weight or heal faster after an injury, illness, or surgery.  To increase your protein and calories, add ingredients such as whole milk, peanut butter, cheese, beans, meat, or seafood to meal items.  To get enough extra calories each day, include high-calorie foods and beverages at each meal.  Adding a high-calorie drink or shake can be an easy way to help you get enough calories each day. Talk with your healthcare provider or dietitian about the best options for you. This information is not intended to replace advice given to you by your health care provider. Make sure you discuss any questions you have with your health care provider. Document Released: 09/27/2005 Document Revised: 09/09/2017 Document Reviewed: 08/09/2017 Elsevier Patient Education  2020 [employer].

## 2019-08-08 ENCOUNTER — Other Ambulatory Visit: Payer: Self-pay | Admitting: Internal Medicine

## 2019-08-08 DIAGNOSIS — G8929 Other chronic pain: Secondary | ICD-10-CM

## 2019-08-08 DIAGNOSIS — M545 Low back pain, unspecified: Secondary | ICD-10-CM

## 2019-08-08 MED ORDER — GABAPENTIN 100 MG PO CAPS: ORAL_CAPSULE | ORAL | 0 refills | Status: DC

## 2019-08-16 ENCOUNTER — Other Ambulatory Visit: Payer: Self-pay

## 2019-08-16 ENCOUNTER — Encounter: Payer: Self-pay | Admitting: Internal Medicine

## 2019-08-16 ENCOUNTER — Ambulatory Visit (INDEPENDENT_AMBULATORY_CARE_PROVIDER_SITE_OTHER): Payer: PPO | Admitting: Internal Medicine

## 2019-08-16 VITALS — BP 104/54 | HR 93 | Temp 97.5°F

## 2019-08-16 DIAGNOSIS — E860 Dehydration: Secondary | ICD-10-CM | POA: Diagnosis not present

## 2019-08-16 DIAGNOSIS — R0989 Other specified symptoms and signs involving the circulatory and respiratory systems: Secondary | ICD-10-CM | POA: Diagnosis not present

## 2019-08-16 DIAGNOSIS — N390 Urinary tract infection, site not specified: Secondary | ICD-10-CM

## 2019-08-16 DIAGNOSIS — N179 Acute kidney failure, unspecified: Secondary | ICD-10-CM

## 2019-08-16 DIAGNOSIS — Z79899 Other long term (current) drug therapy: Secondary | ICD-10-CM | POA: Diagnosis not present

## 2019-08-16 DIAGNOSIS — E861 Hypovolemia: Secondary | ICD-10-CM | POA: Diagnosis not present

## 2019-08-16 DIAGNOSIS — T796XXS Traumatic ischemia of muscle, sequela: Secondary | ICD-10-CM

## 2019-08-16 DIAGNOSIS — E876 Hypokalemia: Secondary | ICD-10-CM | POA: Diagnosis not present

## 2019-08-16 NOTE — Progress Notes (Signed)
Subjective:    Patient ID: Shelly Barrett, female    DOB: 1941-05-02, 78 y.o.   MRN: [id number]  HPI       This very nice 78 y.o.  MWF with labile HTN, HLD, Prediabetes, Vitamin D Deficiency,DJD and DDD / moderately severe thoracolumbar scoliosis was recently hospitalized Sept 21-25 with Dehydration /acut renal injury / Hypokalemia. & treated for UTI / Urosepsis.  She also had Rhabdomyolysis consequent of a fall.  Patient denies any falls since seen here 1 month ago after her hospitalization.      Patient is brought in today by her son Shelly Barrett who has been staying with her ay night as she is having difficulty standing independently & walking which she attributes to Low back pain from her scoliosis. Patient has a Therapist, sports with her from 9a to 5p daily.  Patient refuses to take the Gabapentin sent in for her after she read the possible side-effects. She also refuses to even try Tylenol. Son feel like she is getting adequate fluids and she is supplementing her meager  Meals with Ensure. Son indicates a high level of frustration in providing care for her.   Medication Sig  . VITAMIN D 1000 UNITS tablet Take 2,000 Units by mouth daily.   Marland Kitchen CALCIUM Take 250 mg by mouth.  . gabapentin  100 MG capsule Take 1 to 2 capsules 3 to 4 x /day with food for Back Pain (Patient not taking: Reported on 08/16/2019)    Allergies  Allergen Reactions  . Betadine [Povidone Iodine]   . Ciprocinonide [Fluocinolone]   . Ciprofloxacin Hcl     Hives  . Flexeril [Cyclobenzaprine]   . Ibuprofen   . Lactose Intolerance (Gi)     Diarrhea, cramping  . Macrobid [Nitrofurantoin Macrocrystal] Diarrhea, Nausea And Vomiting and Swelling  . Naprosyn [Naproxen]   . Penicillins     REACTION: rash Documentation from [place] in 2012 patient was on zosyn then on keflex without known adverse reaction  . Shellfish Allergy   . Sodium Benzoate [Nutritional Supplements]   . Iodinated Diagnostic Agents Rash    Past Medical History:  Diagnosis Date  . Arthritis   . Hyperlipidemia   . Ileus ([place]) 07/03/2019  . Left femoral hernia without obstruction or gangrene 12/14/2017   On CT 12/13/2017  . Prediabetes   . Scoliosis   . Sepsis ([place]) 07/02/2019  . Sigmoid diverticulitis   . Vitamin D deficiency    Past Surgical History:  Procedure Laterality Date  . [place], 2012   implants  . COLONOSCOPY    . TONSILLECTOMY AND ADENOIDECTOMY    . TUBAL LIGATION      Review of Systems     Objective:   Physical Exam  BP (!) 104/54   Pulse 93   Temp (!) 97.5 F (36.4 C)   SpO2 99%   Rechecked    BP   116/66     P 95  HEENT - WNL. Neck - supple.  Chest - Clear equal BS. Cor - Nl HS. RRR w/o sig MGR. PP 1(+). No edema. Abd - Soft , benign and non-tender. No masses. MS- FROM w/o deformities.  Gait Nl. Neuro -  Nl w/o focal abnormalities    Assessment & Plan:   1. Labile hypertension  - CBC with Diff - COMPLETE METABOLIC PANEL WITH GFR - Magnesium  2. Dehydration  - CBC with Diff - COMPLETE METABOLIC PANEL WITH GFR  3.  Acute renal injury due to hypovolemia (HCC)  - COMPLETE METABOLIC PANEL WITH GFR  4. Hypokalemia  - COMPLETE METABOLIC PANEL WITH GFR  5. Traumatic rhabdomyolysis, sequela  - CK  6. Urinary tract infection without hematuria, site unspecified  - Urinalysis, Routine w reflex microscopic - Culture, Urine  7. Medication management  - CBC with Diff - COMPLETE METABOLIC PANEL WITH GFR - Magnesium - Urinalysis, Routine w reflex microscopic - Culture, Urine - CK  25 minutes of counseling, chart review, and critical decision making was performed

## 2019-08-16 NOTE — Progress Notes (Signed)
Patient ID: Shelly Barrett, female   DOB: 06-20-1941, 78 y.o.   MRN: [id number]

## 2019-08-17 LAB — COMPLETE METABOLIC PANEL WITH GFR
AG Ratio: 1.3 (calc) (ref 1.0–2.5)
ALT: 12 U/L (ref 6–29)
AST: 15 U/L (ref 10–35)
Albumin: 3.4 g/dL — ABNORMAL LOW (ref 3.6–5.1)
Alkaline phosphatase (APISO): 161 U/L — ABNORMAL HIGH (ref 37–153)
BUN/Creatinine Ratio: 24 (calc) — ABNORMAL HIGH (ref 6–22)
BUN: 24 mg/dL (ref 7–25)
CO2: 29 mmol/L (ref 20–32)
Calcium: 8.7 mg/dL (ref 8.6–10.4)
Chloride: 101 mmol/L (ref 98–110)
Creat: 0.99 mg/dL — ABNORMAL HIGH (ref 0.60–0.93)
GFR, Est African American: 64 mL/min/{1.73_m2} (ref >=60)
GFR, Est Non African American: 55 mL/min/{1.73_m2} — ABNORMAL LOW (ref >=60)
Globulin: 2.6 g/dL (calc) (ref 1.9–3.7)
Glucose, Bld: 127 mg/dL — ABNORMAL HIGH (ref 65–99)
Potassium: 3.8 mmol/L (ref 3.5–5.3)
Sodium: 141 mmol/L (ref 135–146)
Total Bilirubin: 0.4 mg/dL (ref 0.2–1.2)
Total Protein: 6 g/dL — ABNORMAL LOW (ref 6.1–8.1)

## 2019-08-17 LAB — CBC WITH DIFFERENTIAL/PLATELET
Absolute Monocytes: 377 cells/uL (ref 200–950)
Basophils Absolute: 18 cells/uL (ref 0–200)
Basophils Relative: 0.2 %
Eosinophils Absolute: 9 cells/uL — ABNORMAL LOW (ref 15–500)
Eosinophils Relative: 0.1 %
HCT: 31.7 % — ABNORMAL LOW (ref 35.0–45.0)
Hemoglobin: 10.4 g/dL — ABNORMAL LOW (ref 11.7–15.5)
Lymphs Abs: 800 cells/uL — ABNORMAL LOW (ref 850–3900)
MCH: 28.7 pg (ref 27.0–33.0)
MCHC: 32.8 g/dL (ref 32.0–36.0)
MCV: 87.6 fL (ref 80.0–100.0)
MPV: 10.4 fL (ref 7.5–12.5)
Monocytes Relative: 4.1 %
Neutro Abs: 7995 cells/uL — ABNORMAL HIGH (ref 1500–7800)
Neutrophils Relative %: 86.9 %
Platelets: 408 10*3/uL — ABNORMAL HIGH (ref 140–400)
RBC: 3.62 10*6/uL — ABNORMAL LOW (ref 3.80–5.10)
RDW: 13.3 % (ref 11.0–15.0)
Total Lymphocyte: 8.7 %
WBC: 9.2 10*3/uL (ref 3.8–10.8)

## 2019-08-17 LAB — MAGNESIUM: Magnesium: 2 mg/dL (ref 1.5–2.5)

## 2019-08-17 LAB — CK: Total CK: 14 U/L — ABNORMAL LOW (ref 29–143)

## 2019-08-18 ENCOUNTER — Other Ambulatory Visit: Payer: Self-pay | Admitting: Internal Medicine

## 2019-08-18 DIAGNOSIS — N390 Urinary tract infection, site not specified: Secondary | ICD-10-CM

## 2019-08-18 LAB — URINALYSIS, ROUTINE W REFLEX MICROSCOPIC
Bilirubin Urine: NEGATIVE
Glucose, UA: NEGATIVE
Ketones, ur: NEGATIVE
Nitrite: POSITIVE — AB
Specific Gravity, Urine: 1.018 (ref 1.001–1.03)
Squamous Epithelial / HPF: NONE SEEN /HPF (ref <=5)
WBC, UA: >=60 /HPF — AB (ref 0–5)
pH: <=5 (ref 5.0–8.0)

## 2019-08-18 LAB — URINE CULTURE
MICRO NUMBER:: 1069517
SPECIMEN QUALITY:: ADEQUATE

## 2019-08-18 MED ORDER — SULFAMETHOXAZOLE-TRIMETHOPRIM 400-80 MG PO TABS: ORAL_TABLET | ORAL | 0 refills | Status: DC

## 2019-08-23 ENCOUNTER — Ambulatory Visit: Payer: PPO | Admitting: Adult Health

## 2019-09-25 ENCOUNTER — Other Ambulatory Visit: Payer: Self-pay

## 2019-09-25 ENCOUNTER — Encounter: Payer: Self-pay | Admitting: Adult Health

## 2019-09-25 ENCOUNTER — Ambulatory Visit (INDEPENDENT_AMBULATORY_CARE_PROVIDER_SITE_OTHER): Payer: PPO | Admitting: Adult Health

## 2019-09-25 VITALS — BP 118/66 | HR 88 | Temp 97.5°F | Wt 81.4 lb

## 2019-09-25 DIAGNOSIS — R6889 Other general symptoms and signs: Secondary | ICD-10-CM | POA: Diagnosis not present

## 2019-09-25 DIAGNOSIS — R350 Frequency of micturition: Secondary | ICD-10-CM | POA: Diagnosis not present

## 2019-09-25 DIAGNOSIS — R195 Other fecal abnormalities: Secondary | ICD-10-CM

## 2019-09-25 DIAGNOSIS — R591 Generalized enlarged lymph nodes: Secondary | ICD-10-CM

## 2019-09-25 DIAGNOSIS — R0989 Other specified symptoms and signs involving the circulatory and respiratory systems: Secondary | ICD-10-CM

## 2019-09-25 MED ORDER — PREDNISONE 20 MG PO TABS: ORAL_TABLET | ORAL | 0 refills | Status: DC

## 2019-09-25 NOTE — Progress Notes (Signed)
Assessment and Plan:  Shelly Barrett was seen today for adenopathy.  Diagnoses and all orders for this visit:  Congestion of throat/ tender lymph node Non-specific symptoms, benign exam, duration and lack of progression do not suggest infectious etiology ? environmental allergies Discussed the importance of avoiding unnecessary antibiotic therapy and risks associated with unnecessary abx use Suggested symptomatic OTC remedies. Nasal saline spray, nasal steroids for congestion if needed Suggested trial of allergy pill, oral steroids offered Follow up as needed She will call back if tender lymph node in neck is not resolving - declines any labs, imaging today very firmly   Call back with any new symptoms or if not improving - notably cough, chest congestion, sore throat, HA, fever/chills, can get CXR and initiate abx -     predniSONE (DELTASONE) 20 MG tablet; 2 tablets daily for 3 days, 1 tablet daily for 4 days.  Urinary frequency Per patient request checking for e. Coli UTI resolution after recent hospitalization Denies other urinary sx Push fluids -     Urinalysis w microscopic + reflex cultur  Stool mucus She declines any workup for this today; declines to provide a stool sample She will monitor and follow up if this is not improving  BMI 14/ malnutrition  Reviewed high calorie/protein diet, encouraged increase ensure/boost IN ADDITION TO MEALS 2 boxes given today  She declines any labs or imaging;  Continue close monitoring   Further disposition pending results of labs. Discussed med's effects and SE's.   Over 15 minutes of exam, counseling, chart review, and critical decision making was performed.   Future Appointments  Date Time Provider [place]  10/25/2019 11:15 AM Liane Comber, NP [facility] None  05/01/2020  2:00 PM [name], MD [facility] None     ------------------------------------------------------------------------------------------------------------------   HPI BP 118/66   Pulse 88   Temp (!) 97.5 F (36.4 C)   Wt 81 lb 6.4 oz (36.9 kg)   SpO2 99%   BMI 14.42 kg/m   78 y.o.female presents accompanied by her son for evaluation of "sinus infection" - persistent thick mucus in her throat for several weeks "maybe over 1 month, not really sure", reports has sensation of mucus in throat and will have to spit up through the day, thick yellow/brown mucus, varying quantity; denies cough or sore throat, does report tenderness in her neck and concerned about swollen glands for 1 week. She denies fever/chills. She denies nasal congestion, sinus pressure/tenderness. She is unsure whether having post-nasal drip. Denies headache. Denies itching/sneezing. Denies CP, dyspnea, wheezing.   She reports hasn't tried anything for her symptoms, has been resting and trying to recover from recent hospitalization. She reports her symptoms seem to be stable.   She is also concerned about mucus in her stool for the past 1-2 weeks, will be on toilet paper when she wipes. Denies n/v/d/abdominal pain, blood in stool.   She does endorse ongoing urinary frequency, reports this is consistent with baseline, denies dysuria or urine character changes but with recent admission for e. Coli sepsis and concerned about rechecking for resolution.     BMI is Body mass index is 14.42 kg/m., she has been working on diet, doing ensure/boost, unfortunately follows a very restricted diet due to numerous perceived intolerances.  Wt Readings from Last 3 Encounters:  09/25/19 81 lb 6.4 oz (36.9 kg)  07/19/19 85 lb 6.4 oz (38.7 kg)  07/03/19 83 lb (37.6 kg)    Past Medical History:  Diagnosis Date  . Arthritis   .  Hyperlipidemia   . Ileus ([place]) 07/03/2019  . Left femoral hernia without obstruction or gangrene 12/14/2017   On CT 12/13/2017  . Prediabetes   . Scoliosis    . Sepsis ([place]) 07/02/2019  . Sigmoid diverticulitis   . Vitamin D deficiency      Allergies  Allergen Reactions  . Betadine [Povidone Iodine]   . Ciprocinonide [Fluocinolone]   . Ciprofloxacin Hcl     Hives  . Flexeril [Cyclobenzaprine]   . Ibuprofen   . Lactose Intolerance (Gi)     Diarrhea, cramping  . Macrobid [Nitrofurantoin Macrocrystal] Diarrhea, Nausea And Vomiting and Swelling  . Naprosyn [Naproxen]   . Penicillins     REACTION: rash Documentation from [place] in 2012 patient was on zosyn then on keflex without known adverse reaction  . Shellfish Allergy   . Sodium Benzoate [Nutritional Supplements]   . Iodinated Diagnostic Agents Rash    Current Outpatient Medications on File Prior to Visit  Medication Sig  . cholecalciferol (VITAMIN D) 1000 UNITS tablet Take 2,000 Units by mouth daily.   Marland Kitchen acetaminophen (TYLENOL) 325 MG tablet Take 2 tablets (650 mg total) by mouth every 6 (six) hours as needed for mild pain (or Fever >/= 101). (Patient not taking: Reported on 08/16/2019)  . CALCIUM PO Take 250 mg by mouth.  . gabapentin (NEURONTIN) 100 MG capsule Take 1 to 2 capsules 3 to 4 x /day with food for Back Pain (Patient not taking: Reported on 08/16/2019)  . sulfamethoxazole-trimethoprim (BACTRIM) 400-80 MG tablet Take 1 tablet 2 x /day with a Meal for UTI   No current facility-administered medications on file prior to visit.    ROS: all negative except above.   Physical Exam:  BP 118/66   Pulse 88   Temp (!) 97.5 F (36.4 C)   Wt 81 lb 6.4 oz (36.9 kg)   SpO2 99%   BMI 14.42 kg/m   General Appearance: Very thin/frail elder, well dressed, in no apparent distress. Eyes: PERRLA, conjunctiva no swelling or erythema Sinuses: No Frontal/maxillary tenderness ENT/Mouth: Ext aud canals clear, TMs without erythema, bulging. No erythema, swelling, or exudate on post pharynx.  Tonsils not swollen or erythematous. Mildly HOH.  Neck: Supple, thyroid normal.   Respiratory: Respiratory effort normal, limited expansion with kyphosis, BS equal bilaterally without rales, rhonchi, wheezing or stridor.  Cardio: RRR with no MRGs. Brisk peripheral pulses without edema.  Abdomen: Soft, + BS.  Non tender, no guarding, rebound, hernias, masses. Lymphatics: She has mild anterior cervical chain/submandibular tenderness, single palpable lymph node Musculoskeletal: moderate kyphosis, otherwise no obvious deformity, slow steady gait.  Skin: Warm, dry without rashes, lesions, ecchymosis.  Neuro: Cranial nerves intact. Normal muscle tone, no cerebellar symptoms. Sensation intact.  Psych: Awake and oriented X 3, normal affect, Insight and Judgment appropriate.     Izora Ribas, NP 11:03 AM [facility]

## 2019-09-25 NOTE — Patient Instructions (Addendum)
    Suggest you try adding a daily allergy medication for 2 weeks - loratidine, allegra, etc - generic is fine  Monitor your symptoms - please let me know if you start having any fever/chills, cough that settles into your chest, sore throat or any other new/worse symptoms   Increase hydration   Can add guaifenacin or mucinex ONLY if having very thick secretions that you are struggling to get out; hot steamy showers can also help

## 2019-09-28 ENCOUNTER — Encounter: Payer: Self-pay | Admitting: Adult Health

## 2019-09-28 DIAGNOSIS — N3020 Other chronic cystitis without hematuria: Secondary | ICD-10-CM | POA: Insufficient documentation

## 2019-09-28 LAB — URINALYSIS W MICROSCOPIC + REFLEX CULTURE
Bilirubin Urine: NEGATIVE
Glucose, UA: NEGATIVE
Hyaline Cast: NONE SEEN /LPF
Ketones, ur: NEGATIVE
Nitrites, Initial: POSITIVE — AB
Specific Gravity, Urine: 1.02 (ref 1.001–1.03)
Squamous Epithelial / HPF: NONE SEEN /HPF (ref <=5)
WBC, UA: >=60 /HPF — AB (ref 0–5)
pH: <=5 (ref 5.0–8.0)

## 2019-09-28 LAB — URINE CULTURE
MICRO NUMBER:: 1200618
SPECIMEN QUALITY:: ADEQUATE

## 2019-09-28 LAB — CULTURE INDICATED

## 2019-10-01 ENCOUNTER — Encounter: Payer: Self-pay | Admitting: Adult Health

## 2019-10-01 ENCOUNTER — Other Ambulatory Visit: Payer: Self-pay | Admitting: Adult Health

## 2019-10-01 MED ORDER — CEPHALEXIN 250 MG PO CAPS: 250.0000 mg | ORAL_CAPSULE | Freq: Every day | ORAL | 0 refills | Status: DC

## 2019-10-25 ENCOUNTER — Ambulatory Visit: Payer: Self-pay | Admitting: Adult Health

## 2020-02-07 DIAGNOSIS — H25813 Combined forms of age-related cataract, bilateral: Secondary | ICD-10-CM | POA: Diagnosis not present

## 2020-02-07 DIAGNOSIS — H524 Presbyopia: Secondary | ICD-10-CM | POA: Diagnosis not present

## 2020-02-07 DIAGNOSIS — H52203 Unspecified astigmatism, bilateral: Secondary | ICD-10-CM | POA: Diagnosis not present

## 2020-02-07 DIAGNOSIS — H5213 Myopia, bilateral: Secondary | ICD-10-CM | POA: Diagnosis not present

## 2020-02-21 ENCOUNTER — Other Ambulatory Visit: Payer: Self-pay

## 2020-02-21 ENCOUNTER — Ambulatory Visit
Admission: RE | Admit: 2020-02-21 | Discharge: 2020-02-21 | Disposition: A | Discharge status: Home / Self Care | Payer: PPO | Source: Ambulatory Visit | Attending: Physician Assistant | Admitting: Physician Assistant

## 2020-02-21 ENCOUNTER — Ambulatory Visit (INDEPENDENT_AMBULATORY_CARE_PROVIDER_SITE_OTHER): Payer: PPO | Admitting: Physician Assistant

## 2020-02-21 ENCOUNTER — Encounter: Payer: Self-pay | Admitting: Physician Assistant

## 2020-02-21 VITALS — BP 98/66 | HR 94 | Temp 97.5°F | Wt 77.6 lb

## 2020-02-21 DIAGNOSIS — K5900 Constipation, unspecified: Secondary | ICD-10-CM

## 2020-02-21 DIAGNOSIS — R1030 Lower abdominal pain, unspecified: Secondary | ICD-10-CM

## 2020-02-21 DIAGNOSIS — L219 Seborrheic dermatitis, unspecified: Secondary | ICD-10-CM

## 2020-02-21 DIAGNOSIS — R6883 Chills (without fever): Secondary | ICD-10-CM

## 2020-02-21 DIAGNOSIS — R1900 Intra-abdominal and pelvic swelling, mass and lump, unspecified site: Secondary | ICD-10-CM | POA: Diagnosis not present

## 2020-02-21 LAB — CBC WITH DIFFERENTIAL/PLATELET
Absolute Monocytes: 428 cells/uL (ref 200–950)
Basophils Absolute: 10 cells/uL (ref 0–200)
Basophils Relative: 0.1 %
Eosinophils Absolute: 0 cells/uL — ABNORMAL LOW (ref 15–500)
Eosinophils Relative: 0 %
HCT: 35.9 % (ref 35.0–45.0)
Hemoglobin: 12 g/dL (ref 11.7–15.5)
Lymphs Abs: 200 cells/uL — ABNORMAL LOW (ref 850–3900)
MCH: 30.7 pg (ref 27.0–33.0)
MCHC: 33.4 g/dL (ref 32.0–36.0)
MCV: 91.8 fL (ref 80.0–100.0)
MPV: 10.3 fL (ref 7.5–12.5)
Monocytes Relative: 4.5 %
Neutro Abs: 8864 cells/uL — ABNORMAL HIGH (ref 1500–7800)
Neutrophils Relative %: 93.3 %
Platelets: 172 10*3/uL (ref 140–400)
RBC: 3.91 10*6/uL (ref 3.80–5.10)
RDW: 14.1 % (ref 11.0–15.0)
Total Lymphocyte: 2.1 %
WBC: 9.5 10*3/uL (ref 3.8–10.8)

## 2020-02-21 LAB — COMPLETE METABOLIC PANEL WITH GFR
AG Ratio: 2 (calc) (ref 1.0–2.5)
ALT: 21 U/L (ref 6–29)
AST: 27 U/L (ref 10–35)
Albumin: 4.4 g/dL (ref 3.6–5.1)
Alkaline phosphatase (APISO): 73 U/L (ref 37–153)
BUN/Creatinine Ratio: 39 (calc) — ABNORMAL HIGH (ref 6–22)
BUN: 27 mg/dL — ABNORMAL HIGH (ref 7–25)
CO2: 27 mmol/L (ref 20–32)
Calcium: 9.3 mg/dL (ref 8.6–10.4)
Chloride: 103 mmol/L (ref 98–110)
Creat: 0.7 mg/dL (ref 0.60–0.93)
GFR, Est African American: 97 mL/min/{1.73_m2} (ref >=60)
GFR, Est Non African American: 84 mL/min/{1.73_m2} (ref >=60)
Globulin: 2.2 g/dL (calc) (ref 1.9–3.7)
Glucose, Bld: 143 mg/dL — ABNORMAL HIGH (ref 65–99)
Potassium: 3.7 mmol/L (ref 3.5–5.3)
Sodium: 140 mmol/L (ref 135–146)
Total Bilirubin: 0.9 mg/dL (ref 0.2–1.2)
Total Protein: 6.6 g/dL (ref 6.1–8.1)

## 2020-02-21 LAB — LACTATE DEHYDROGENASE: LDH: 145 U/L (ref 120–250)

## 2020-02-21 MED ORDER — DOXYCYCLINE HYCLATE 100 MG PO CAPS: ORAL_CAPSULE | ORAL | 0 refills | Status: DC

## 2020-02-21 MED ORDER — CLOBETASOL PROPIONATE 0.05 % EX SOLN: 1.0000 "application " | Freq: Two times a day (BID) | CUTANEOUS | 0 refills | Status: DC

## 2020-02-21 NOTE — Progress Notes (Signed)
Subjective:    Patient ID: Shelly Barrett, female    DOB: 1941-02-17, 79 y.o.   MRN: [id number]  HPI 79 y.o. WF with history of sepsis due to ecoli with an ileus in 07/2019, malnutrition, pressure ulcer comes into today with AB pain.   She has had diarrhea intermittent but with 2 days of constipation, . She had chills this AM, decreased appetite, drinking fluids.   Patient states she has had AB pain, feeling urge to defecate but has been unable to, had a very very small BM this AM and has been passing gas, small black stool. She was having mucus in her stool but not recently.  Last colonoscopy 2018 Dr. Carlean Purl- normal CT AB 12/2017 Wt Readings from Last 10 Encounters:  02/21/20 77 lb 9.6 oz (35.2 kg)  09/25/19 81 lb 6.4 oz (36.9 kg)  07/19/19 85 lb 6.4 oz (38.7 kg)  07/03/19 83 lb (37.6 kg)  04/19/19 83 lb 6.4 oz (37.8 kg)  10/19/18 85 lb 3.2 oz (38.6 kg)  05/22/18 84 lb 6.4 oz (38.3 kg)  01/20/18 86 lb 12.8 oz (39.4 kg)  12/01/17 89 lb 9.6 oz (40.6 kg)  07/12/17 86 lb 3.2 oz (39.1 kg)   CT AB IMPRESSION: 1. Bilateral nephrolithiasis, without obstructive uropathy. 2. Degraded exam, secondary to paucity of abdominopelvic fat. 3. Small bowel entering a right femoral hernia, without obstruction or evidence of strangulation. 4.  Possible constipation. 5.  Aortic Atherosclerosis (ICD10-I70.0). 6. Left renal lesion which is favored to represent a minimally complex cyst, given size stability back to 09/19/2015. Apparent increased density after contrast is favored to represent "pseudoenhancement" a multidetector CT reconstruction artifact. If the patient is an MRI candidate, this could be performed to further evaluate. If not, ultrasound surveillance at 6 months suggested. 7. Bilateral femoral head avascular necrosis.  Blood pressure 98/66, pulse 94, temperature (!) 97.5 F (36.4 C), weight 77 lb 9.6 oz (35.2 kg), SpO2 98 %.  Medications       Current Outpatient Medications  (Other):  Marland Kitchen  CALCIUM PO, Take 250 mg by mouth. .  cholecalciferol (VITAMIN D) 1000 UNITS tablet, Take 2,000 Units by mouth daily.   Problem list She has Carotid stenosis; Hypertension; Hyperlipidemia; Vitamin D deficiency; Fibrocystic breast changes, bilateral; Body mass index (BMI) less than 16.5; Malnutrition of mild degree ([place]); Aortic atherosclerosis ([place]); Scoliosis of thoracolumbar spine; Normocytic anemia; Hypotension; and Bladder infection, chronic on their problem list.     Review of Systems  Constitutional: Positive for appetite change, chills, fatigue and unexpected weight change. Negative for activity change, diaphoresis and fever.  HENT: Negative.   Respiratory: Negative.   Cardiovascular: Positive for leg swelling. Negative for chest pain and palpitations.  Gastrointestinal: Positive for abdominal distention, abdominal pain, constipation, diarrhea and nausea. Negative for anal bleeding, blood in stool, rectal pain and vomiting.  Genitourinary: Negative.   Musculoskeletal: Negative.   Skin: Positive for rash. Negative for color change, pallor and wound.  Neurological: Positive for weakness (generalized).  Psychiatric/Behavioral: Negative.        Objective:   Physical Exam Constitutional:      General: She is awake.     Appearance: She is cachectic. She is ill-appearing.  Cardiovascular:     Rate and Rhythm: Normal rate and regular rhythm.     Comments: Decreased heart sounds Pulmonary:     Effort: Pulmonary effort is normal.     Breath sounds: Normal breath sounds.  Abdominal:     General: Bowel  sounds are decreased. There is distension.     Palpations: Abdomen is soft. There is no hepatomegaly or splenomegaly.     Tenderness: There is abdominal tenderness in the right lower quadrant and left lower quadrant. There is no guarding or rebound.     Hernia: A hernia is present. Hernia is present in the right femoral area (easily retractable in the office).   Musculoskeletal:     Comments: Patient in wheel chair due to weakness  Skin:    Findings: Rash (flaky erythematous rash along hair line and in hair, no vesicles, no pustuales) present.  Neurological:     Mental Status: She is alert.           Assessment & Plan:  [place] was seen today for constipation.  Lower abdominal pain and constipation with chills -     DG Abd 1 View; Future -     CBC with Differential/Platelet -     COMPLETE METABOLIC PANEL WITH GFR -     Lactate dehydrogenase -     Ambulatory referral to Gastroenterology -     doxycycline (VIBRAMYCIN) 100 MG capsule; Take 1 capsule twice daily with food VERY long discussion with patient. Patient was advised to go to the ER for best care however she is very concerned about her husband who has dementia and refuses to go. She continues with weight loss and I have informed her she has a very low reserve for sickness due to malnutrition, understands the risk of AMA and not going to the hospital.  We will get a STAT KUB to look for ileus/obstruction, though patient did have small BM this AM. She has decreased BS, very distended but soft AB. I did reduce a right femoral/inguinal hernia- does not appear to be strangulated at this time- discussed signs of strangulation and reasons to go to ER.  Will also treat for possible diverticulitis, patient is unable to tolerate many ABX, will send in doxy at this time for out patient treatment and will do liquid diet.   The patient was advised to call immediately if she has any concerning symptoms in the interval. The patient voices understanding of current treatment options and is in agreement with the current care plan.The patient knows to call the clinic with any problems, questions or concerns or go to the ER if any further progression of symptoms.    Call Son at [phone] [place]

## 2020-02-21 NOTE — Patient Instructions (Signed)
Start on doxycycline for diverticulitis Do liquid diet for a few days with jello/brothes then go to bowel rest Bowel rest means no wheat/fiber, so please eat white stuff like potatoes, soup, crackers until you are feeling better and then slowly advance your diet back to veggies and fiber like wheat.   Please go to the ER if you have any severe AB pain, unable to hold down food/water, blood in stool or vomit, chest pain, shortness of breath, or any worsening symptoms.    Diverticulitis Diverticulitis is inflammation or infection of small pouches in your colon that form when you have a condition called diverticulosis. The pouches in your colon are called diverticula. Your colon, or large intestine, is where water is absorbed and stool is formed. Complications of diverticulitis can include:  Bleeding.  Severe infection.  Severe pain.  Perforation of your colon.  Obstruction of your colon.  What are the causes? Diverticulitis is caused by bacteria. Diverticulitis happens when stool becomes trapped in diverticula. This allows bacteria to grow in the diverticula, which can lead to inflammation and infection. What increases the risk? People with diverticulosis are at risk for diverticulitis. Eating a diet that does not include enough fiber from fruits and vegetables may make diverticulitis more likely to develop. What are the signs or symptoms? Symptoms of diverticulitis may include:  Abdominal pain and tenderness. The pain is normally located on the left side of the abdomen, but may occur in other areas.  Fever and chills.  Bloating.  Cramping.  Nausea.  Vomiting.  Constipation.  Diarrhea.  Blood in your stool.  How is this diagnosed? Your health care provider will ask you about your medical history and do a physical exam. You may need to have tests done because many medical conditions can cause the same symptoms as diverticulitis. Tests may include:  Blood tests.  Urine  tests.  Imaging tests of the abdomen, including X-rays and CT scans.  When your condition is under control, your health care provider may recommend that you have a colonoscopy. A colonoscopy can show how severe your diverticula are and whether something else is causing your symptoms. How is this treated? Most cases of diverticulitis are mild and can be treated at home. Treatment may include:  Taking over-the-counter pain medicines.  Following a clear liquid diet.  Taking antibiotic medicines by mouth for 7-10 days.  More severe cases may be treated at a hospital. Treatment may include:  Not eating or drinking.  Taking prescription pain medicine.  Receiving antibiotic medicines through an IV tube.  Receiving fluids and nutrition through an IV tube.  Surgery.  Follow these instructions at home:  Follow your health care provider's instructions carefully.  Follow a full liquid diet or other diet as directed by your health care provider. After your symptoms improve, your health care provider may tell you to change your diet. He or she may recommend you eat a high-fiber diet. Fruits and vegetables are good sources of fiber. Fiber makes it easier to pass stool.  Take fiber supplements or probiotics as directed by your health care provider.  Only take medicines as directed by your health care provider.  Keep all your follow-up appointments. Contact a health care provider if:  Your pain does not improve.  You have a hard time eating food.  Your bowel movements do not return to normal. Get help right away if:  Your pain becomes worse.  Your symptoms do not get better.  Your symptoms suddenly get  worse.  You have a fever.  You have repeated vomiting.  You have bloody or black, tarry stools. This information is not intended to replace advice given to you by your health care provider. Make sure you discuss any questions you have with your health care provider. Document  Released: 07/07/2005 Document Revised: 03/04/2016 Document Reviewed: 08/22/2013 Elsevier Interactive Patient Education  2017 [employer]   Low-Fiber Eating Plan Fiber is found in fruits, vegetables, whole grains, and beans. Eating a diet low in fiber helps to reduce how often you have bowel movements and how much you produce during a bowel movement. A low-fiber eating plan may help your digestive system heal if:  You have certain conditions, such as Crohn's disease or diverticulitis.  You recently had radiation therapy on your pelvis or bowel.  You recently had intestinal surgery.  You have a new surgical opening in your abdomen (colostomy or ileostomy).  Your intestine is narrowed (stricture). Your health care provider will determine how long you need to stay on this diet. Your health care provider may recommend that you work with a diet and nutrition specialist (dietitian). What are tips for following this plan? General guidelines  Follow recommendations from your dietitian about how much fiber you should have each day.  Most people on this eating plan should try to eat less than 10 grams (g) of fiber each day. Your daily fiber goal is _________________ g.  Take vitamin and mineral supplements as told by your health care provider or dietitian. Chewable or liquid forms are best when on this eating plan. Reading food labels  Check food labels for the amount of dietary fiber.  Choose foods that have less than 2 grams of fiber in one serving. Cooking  Use white flour and other allowed grains for baking and cooking.  Cook meat using methods that keep it tender, such as braising or poaching.  Cook eggs until the yolk is completely solid.  Cook with healthy oils, such as olive oil or canola oil. Meal planning   Eat 5-6 small meals throughout the day instead of 3 large meals.  If you are lactose intolerant: ? Choose low-lactose dairy foods. ? Do not eat dairy foods, if told  by your dietitian.  Limit fat and oils to less than 8 teaspoons a day.  Eat small portions of desserts. What foods are allowed? The items listed below may not be a complete list. Talk with your dietitian about what dietary choices are best for you. Grains All bread and crackers made with white flour. Waffles, pancakes, and [place] toast. Bagels. Pretzels. Melba toast, zwieback, and matzoh. Cooked and dried cereals that do not contain whole grains, added fiber, seeds, or dried fruit. CornmealDomenick Gong. Hot and cold cereals made with refined corn, wheat, rice, or oats. Plain pasta and noodles. White rice. Vegetables Well-cooked or canned vegetables without skin, seeds, or stems. Cooked potatoes without skins. Vegetable juice. Fruits Soft-cooked or canned fruits without skin and seeds. Peeled ripe banana. Applesauce. Fruit juice without pulp. Meats and other protein foods Ground meat. Tender cuts of meat or poultry. Eggs. Fish, seafood, and shellfish. Smooth nut butters. Tofu. Dairy All milk products and drinks. Lactose-free milks, including rice, soy, and almond milks. Yogurt without fruit, nuts, chocolate, or granola mix-ins. Sour cream. Cottage cheese. Cheese. Beverages Decaf coffee. Fruit and vegetable juices or smoothies (in small amounts, with no pulp or skins, and with fruits from allowed list). Sports drinks. Herbal tea. Fats and oils Olive oil,  canola oil, sunflower oil, flaxseed oil, and grapeseed oil. Mayonnaise. Cream cheese. Margarine. Butter. Sweets and desserts Plain cakes and cookies. Cream pies and pies made with allowed fruits. Pudding. Custard. Fruit gelatin. Sherbet. Popsicles. Ice cream without nuts. Plain hard candy. Honey. Jelly. Molasses. Syrups, including chocolate syrup. Chocolate. Marshmallows. Gumdrops. Seasoning and other foods Bouillon. Broth. Cream soups made from allowed foods. Strained soup. Casseroles made with allowed foods. Ketchup. Mild mustard. Mild salad  dressings. Plain gravies. Vinegar. Spices in moderation. Salt. Sugar. What foods are not allowed? The items listed below may not be a complete list. Talk with your dietitian about what dietary choices are best for you. Grains Whole wheat and whole grain breads and crackers. Multigrain breads and crackers. Rye bread. Whole grain or multigrain cereals. Cereals with nuts, raisins, or coconut. Bran. Coarse wheat cereals. Granola. High-fiber cereals. Cornmeal or corn bread. Whole grain pasta. Wild or brown rice. Quinoa. Popcorn. Buckwheat. Wheat germ. Vegetables Potato skins. Raw or undercooked vegetables. All beans and bean sprouts. Cooked greens. Corn. Peas. Cabbage. Beets. Broccoli. Brussels sprouts. Cauliflower. Mushrooms. Onions. Peppers. Parsnips. Okra. Sauerkraut. Fruit Raw or dried fruit. Berries. Fruit juice with pulp. Prune juice. Meats and other protein foods Tough, fibrous meats with gristle. Fatty meat. Poultry with skin. Fried meat, Sales executive, or fish. Deli or lunch meats. Sausage, bacon, and hot dogs. Nuts and chunky nut butter. Dried peas, beans, and lentils. Dairy Yogurt with fruit, nuts, chocolate, or granola mix-ins. Beverages Caffeinated coffee and teas. Fats and oils Avocado. Coconut. Sweets and desserts Desserts, cookies, or candies that contain nuts or coconut. Dried fruit. Jams and preserves with seeds. Marmalade. Any dessert made with fruits or grains that are not allowed. Seasoning and other foods Corn tortilla chips. Soups made with vegetables or grains that are not allowed. Relish. Horseradish. Angie Fava. Olives. Summary  Most people on a low-fiber eating plan should eat less than 10 grams of fiber a day. Follow recommendations from your dietitian about how much fiber you should have each day.  Always check food labels to see the dietary fiber content of packaged foods. In general, a low-fiber food will have fewer than 2 grams of fiber per serving.  In general, try to  avoid whole grains, raw fruits and vegetables, dried fruit, tough cuts of meat, nuts, and seeds.  Take a vitamin and mineral supplement as told by your health care provider or dietitian. This information is not intended to replace advice given to you by your health care provider. Make sure you discuss any questions you have with your health care provider. Document Revised: 01/19/2019 Document Reviewed: 11/30/2016 Elsevier Patient Education  2020 [employer].

## 2020-02-29 ENCOUNTER — Ambulatory Visit: Payer: PPO | Admitting: Physician Assistant

## 2020-03-26 ENCOUNTER — Telehealth: Payer: Self-pay | Admitting: Physician Assistant

## 2020-03-26 DIAGNOSIS — R6883 Chills (without fever): Secondary | ICD-10-CM

## 2020-03-26 DIAGNOSIS — K5900 Constipation, unspecified: Secondary | ICD-10-CM

## 2020-03-26 DIAGNOSIS — R1030 Lower abdominal pain, unspecified: Secondary | ICD-10-CM

## 2020-03-26 MED ORDER — DOXYCYCLINE HYCLATE 100 MG PO CAPS: ORAL_CAPSULE | ORAL | 0 refills | Status: DC

## 2020-03-26 NOTE — Telephone Encounter (Signed)
Patient was seen last in May. She was given doxy and referred to GI. She was feeling better so her son canceled the referral but he is calling today that she is constipated again, not eating, and having the AB pain again.   Will resend in the doxy, needs to keep appointment with GI. Unknown if she is taking the miralax, get her back on it.   Need OV here as well if not better  ER precautions discussed.   Please go to the ER if you have any severe AB pain, unable to hold down food/water, blood in stool or vomit, chest pain, shortness of breath, or any worsening symptoms.

## 2020-03-26 NOTE — Telephone Encounter (Signed)
Son also states when she gets sick her memory gets worse, may want to get ammonia level/start lactulose.

## 2020-05-01 ENCOUNTER — Encounter: Payer: PPO | Admitting: Internal Medicine

## 2020-05-01 ENCOUNTER — Ambulatory Visit: Payer: PPO | Admitting: Gastroenterology

## 2020-05-08 DIAGNOSIS — M87059 Idiopathic aseptic necrosis of unspecified femur: Secondary | ICD-10-CM | POA: Insufficient documentation

## 2020-05-08 NOTE — Progress Notes (Signed)
Complete physical exam   Assessment:    Routine physical exam  -due next year  Carotid stenosis, unspecified laterality - diagnosed over 20 years ago, she is declining further imaging as she would decline any surgical recommendations.  - no ASA due to bleeding issues -cholesterol at goal  Atherosclerosis of aorta Control blood pressure, cholesterol, glucose, increase exercise.   Hypotension due to hypovolemia  -increase fluid intake, discussed slow position changes, encourage weight gain -cont to monitor at home -exercise as tolerated  Hyperlipidemia -at goal -cont diet and exercise as tolerated  Other abnormal glucose -cont diet and exercise as tolerated   Vitamin D deficiency -cont supplement -cont calcium with it   Medication management -cont biannual lab testing   Fibrocystic breast changes, bilateral -Declines mammograms, continue breast exams  Constipation/irregular bowel  Suspect poor oral intake and dehydration contributing Reviewed regular intake, appropriate calories/fiber, encouraged to increase fluid significantly (increase from 2 to 4 bottles) Continue colace, senokot, call if abd pain or no BM in 72 hours Please go to the ER if you have any severe AB pain, unable to hold down food/water, blood in stool or vomit, chest pain, shortness of breath, or any worsening symptoms.   Severe protein/calorie malnutrition, severely underweight/generalized weakness Reviewed high calorie/protein diet, reviewed risks of severe underweight malnutrition, encouraged add boost or other tolerated supplement twice daily IN ADDITION TO MEALS She did have workup by GI 2018/2019   Emphasized need for weight gain; some concern with obsessive thought patterns with food, denial, poor understanding and follow through, poor follow through with medical recommendations Son is working on assisted living with better monitoring Discussed and initiated remeron daily to encourage weight gain;  risks and benefits discussed with son, she is reluctantly agreeable to starting low dose Goal to try to regain at minimum to baseline of 100 lb if possible Close follow up with Dr. Melford Aase in 1 month, reviewed above details with him today  Orders Placed This Encounter  Procedures   CBC with Differential/Platelet   COMPLETE METABOLIC PANEL WITH GFR   Magnesium   Lipid panel   TSH   Hemoglobin A1c   VITAMIN D 25 Hydroxy (Vit-D Deficiency, Fractures)   Vitamin B12   Microalbumin / creatinine urine ratio   Urinalysis, Routine w reflex microscopic   Iron   Ferritin   EKG 12-Lead     Over 30 minutes of exam, counseling, chart review, and critical decision making was performed Future Appointments  Date Time Provider [place]  06/12/2020  9:30 AM [name], MD [facility] None  05/12/2021  9:00 AM Liane Comber, NP [facility] None     Plan:   During the course of the visit the patient was educated and counseled about appropriate screening and preventive services including:    Pneumococcal vaccine   Influenza vaccine  Td vaccine  Prevnar 13  Screening electrocardiogram  Screening mammography  Bone densitometry screening  Colorectal cancer screening  Diabetes screening  Glaucoma screening  Nutrition counseling   Advanced directives: given info/requested copies    Subjective:   Shelly Barrett is a 79 y.o. female who presents for CPE. She has Carotid stenosis; Hyperlipidemia; Vitamin D deficiency; Fibrocystic breast changes, bilateral; Body mass index (BMI) less than 16.5; Protein-calorie malnutrition, severe ([place]); Aortic atherosclerosis ([place]); Scoliosis of thoracolumbar spine; Normocytic anemia; Hypotension; Bladder infection, chronic; Avascular necrosis of hip ([place]); and Severely underweight adult on their problem list.   She is accompanied by her son [name].  She was hospitalized Sept 21-25 with Dehydration /acut renal  injury / Hypokalemia. & treated for UTI / Urosepsis.  She also had Rhabdomyolysis consequent of a fall, husband was present but with advanced dementia was unable to follow instructions to call for help. She has been primary caregiver for husband Mikki Santee, but son has been staying with her at night as she is having difficulty standing independently & walking (she attributes to Low back pain from her scoliosis, however with severe low weight, generalized weakness). Patient has a Therapist, sports with her for 4 hours in AM daily, "for Mikki Santee."   She has demonstrated very poor judgement, long history of GI sx, severe protein calorie malnutrition with BMI today of 13.9!!!! - she alleges food sensitivities and has demonstrated some  obsessive thoughts and behaviors about food choices and intake.  Reports lactose intolerance, ensure caused severe diarrhea; unclear what alternatives she has tried. She did have EGD and colonoscopy in 10/2016 by Dr. Carlean Purl, showed possible hiatal hernia, single small colonic polyp, diverticuloses without bleeding. Had negative upper GI series in 11/2016 NEGATIVE for hiatal hernia. She had CT abd/pelvis in 12/2017 which did not show any concerning lesions recommended for follow up. She now declines all further CT scans or other imaging. She does endorse constipation requiring occasional self disimpaction. She does have episdoes of watery explosive diarrhea as well. Taking colace and PRN senokot for irregular bowels, admits to only 2 small bottles (? 8-12 oz) of water daily. Resistant to increase due to episodes of incontinence.   Son indicates a high level of frustration in providing care for her. Per son Sheppard Coil she was recently evaluated in home by geriatric specialist who felt unsafe home environment, she is unable to adequately care for husband in current environment, recommended assisted living which son is looking into. Discussed this with patient at length today; she is very resistant to  this.   BMI is Body mass index is 13.9 kg/m., she has been working on diet, discussed high fat/protein options at length, she is trying to add more nut butters, avocado, etc. Options limited due to food sensitivities. She reports ensure causes severe diarrhea,  Does endorse eating 3 meals a day, oatmeal for breakfast, avocado and chicken sandwhich for dinner, etc. Son is skeptical -  Wt Readings from Last 3 Encounters:  05/12/20 (!) 76 lb (34.5 kg)  02/21/20 77 lb 9.6 oz (35.2 kg)  09/25/19 81 lb 6.4 oz (36.9 kg)   Her blood pressure has been controlled at home, today their BP is BP: (!) 100/64 She does workout. She denies chest pain, shortness of breath. She does endorse dizziness with lying to standing.    She is not on cholesterol medication and denies myalgias. Her cholesterol is at goal. The cholesterol last visit was:   Lab Results  Component Value Date   CHOL 196 04/19/2019   HDL 81 04/19/2019   LDLCALC 102 (H) 04/19/2019   TRIG 51 04/19/2019   CHOLHDL 2.4 04/19/2019   She has been working on diet and exercise for glucose management.  No evidence of polydipsia, polyphagia, or polyuria.   Lab Results  Component Value Date   HGBA1C 5.5 04/19/2019   Last GFR Lab Results  Component Value Date   GFRNONAA 84 02/21/2020   Patient is on Vitamin D supplement, taking 2000 IU but irregularly Lab Results  Component Value Date   VD25OH 49 04/19/2019      Lab Results  Component Value Date   IRON  9 (L) 07/03/2019   TIBC 311 07/03/2019   FERRITIN 140 07/03/2019   Lab Results  Component Value Date   VITAMINB12 305 07/03/2019      Medication Review Current Outpatient Medications on File Prior to Visit  Medication Sig Dispense Refill   CALCIUM PO Take 250 mg by mouth.     cholecalciferol (VITAMIN D) 1000 UNITS tablet Take 2,000 Units by mouth daily.      No current facility-administered medications on file prior to visit.    Current Problems (verified) Patient  Active Problem List   Diagnosis Date Noted   Severely underweight adult 05/12/2020   Avascular necrosis of hip ([place]) 05/08/2020   Bladder infection, chronic 09/28/2019   Normocytic anemia 07/03/2019   Hypotension 07/03/2019   Scoliosis of thoracolumbar spine 01/19/2018   Aortic atherosclerosis ([place]) 12/14/2017   Protein-calorie malnutrition, severe ([place]) 06/23/2017   Body mass index (BMI) less than 16.5 12/31/2016   Fibrocystic breast changes, bilateral 08/28/2015   Hyperlipidemia    Vitamin D deficiency    Carotid stenosis 05/28/2010    Screening Tests Immunization History  Administered Date(s) Administered   Influenza, High Dose Seasonal PF 07/30/2014, 06/25/2015, 07/01/2016, 07/12/2017, 07/05/2018, 06/21/2019   Influenza-Unspecified 06/20/2013   Pneumococcal Conjugate-13 09/18/2014   Pneumococcal-Unspecified 09/16/2009   Tdap 11/17/2010   Zoster 09/16/2009    Preventative care: Last colonoscopy: 10/2016, 1small polyp, mod sigmoid diverticulum, DONE per Dr. Carlean Purl Last mammogram: 09/2015 declines due to pain, does self breast exams Last pap smear/pelvic exam: remote   DEXA: 12/2014 declines another, pain with exam, would decline alendronate Ct AB 12/2017  MRI lumbar 2002  DOESN'T WANT IMAGING AS WOULD NOT PURSUE TREATMENT  Prior vaccinations: TD or Tdap: 2012  Influenza 2020 Pneumococcal: 2010 Prevnar13: 2015  Shingles/Zostavax: 2010 Covid 19: 2/2, 2021, moderna   Names of Other Physician/Practitioners you currently use: 1. [facility]- here for primary care 2. Dr. Delman Cheadle, eye doctor, last visit 2021, goes q27m, wears glasses 3. Dr. Mariea Clonts, dentist, last visit 2019, goes q44m, overdue due to covid 19  Patient Care Team: [name], MD as PCP - General (Internal Medicine) Gatha Mayer, MD as Consulting Physician (Gastroenterology)  Allergies Allergies  Allergen Reactions   Betadine [Povidone  Iodine]    Ciprocinonide [Fluocinolone]    Ciprofloxacin Hcl     Hives   Flexeril [Cyclobenzaprine]    Ibuprofen    Lactose Intolerance (Gi)     Diarrhea, cramping   Macrobid [Nitrofurantoin Macrocrystal] Diarrhea, Nausea And Vomiting and Swelling   Naprosyn [Naproxen]    Penicillins     REACTION: rash Documentation from [place] in 2012 patient was on zosyn then on keflex without known adverse reaction   Shellfish Allergy    Sodium Benzoate [Nutritional Supplements]    Iodinated Diagnostic Agents Rash    SURGICAL HISTORY She  has a past surgical history that includes Breast surgery (1980, 2012); Tubal ligation; Tonsillectomy and adenoidectomy; and Colonoscopy. FAMILY HISTORY Her family history includes Arthritis in her sister; Cancer in her father; Heart disease in her father and mother; Hyperlipidemia in her brother; Hypertension in her mother. SOCIAL HISTORY She  reports that she has never smoked. She has never used smokeless tobacco. She reports that she does not drink alcohol and does not use drugs.    Review of Systems  Constitutional: Negative for malaise/fatigue and weight loss.  HENT: Negative for hearing loss and tinnitus.   Eyes: Negative for blurred vision and double vision.  Respiratory: Negative for cough, sputum production, shortness of breath and wheezing.   Cardiovascular: Negative for chest pain, palpitations, orthopnea, claudication, leg swelling and PND.  Gastrointestinal: Positive for abdominal pain, constipation and diarrhea. Negative for blood in stool, heartburn, melena, nausea and vomiting.  Genitourinary: Negative.   Musculoskeletal: Positive for back pain. Negative for falls, joint pain and myalgias.  Skin: Negative for rash.  Neurological: Negative for dizziness, tingling, sensory change, weakness and headaches.  Endo/Heme/Allergies: Negative for polydipsia.  Psychiatric/Behavioral: Negative.  Negative for depression, memory loss,  substance abuse and suicidal ideas. The patient is not nervous/anxious and does not have insomnia.   All other systems reviewed and are negative.   Objective:   Today's Vitals   05/12/20 0906  BP: (!) 100/64  Pulse: 78  Temp: (!) 97.2 F (36.2 C)  SpO2: 94%  Weight: (!) 76 lb (34.5 kg)  Height: 5\' 2"  (1.575 m)   Body mass index is 13.9 kg/m.  General appearance: alert, no distress, extremely thin/frail WD/WN,  female HEENT: normocephalic, sclerae anicteric, TMs pearly, nares patent, no discharge or erythema, pharynx normal Oral cavity: MMM, no lesions Neck: supple, no lymphadenopathy, no thyromegaly, no masses Heart: RRR, normal S1, S2, no murmurs Lungs: CTA bilaterally, no wheezes, rhonchi, or rales Abdomen: +bs, soft, non tender, non distended, no masses, no hepatomegaly, no splenomegaly Musculoskeletal: nontender, no swelling, she has moderate kyphoscoliosis Extremities: no edema, no cyanosis, no clubbing Pulses: 2+ symmetric, upper and lower extremities, normal cap refill Neurological: alert, oriented x 3, CN2-12 intact, strength 3/5 throughout, upper extremities and lower extremities, sensation normal throughout, DTRs 2+ throughout, no cerebellar signs, slow/weak sitting to standing Psychiatric: normal affect, behavior normal, pleasant  Breast: Breasts: breasts appear normal, no suspicious masses, no skin or nipple changes or axillary nodes, bilateral implants, no palpable abnormalities otherwise. Skin: very dry, thin, flaking; no concerning rash, lesions, ecchymoses Gyn: declines Rectal: defer  EKG: poor quality study due to inability to tolerate position; sinus rhythm, no apparent ST changes from previous  The patient's weight, height, BMI, and visual acuity have been recorded in the chart.  I have made referrals, counseling, and provided education to the patient based on review of the above and I have provided the patient with a written personalized care plan for  preventive services.     Izora Ribas, NP   05/12/2020

## 2020-05-12 ENCOUNTER — Encounter: Payer: Self-pay | Admitting: Adult Health

## 2020-05-12 ENCOUNTER — Other Ambulatory Visit: Payer: Self-pay

## 2020-05-12 ENCOUNTER — Ambulatory Visit (INDEPENDENT_AMBULATORY_CARE_PROVIDER_SITE_OTHER): Payer: PPO | Admitting: Adult Health

## 2020-05-12 VITALS — BP 100/64 | HR 78 | Temp 97.2°F | Ht 62.0 in | Wt 76.0 lb

## 2020-05-12 DIAGNOSIS — E538 Deficiency of other specified B group vitamins: Secondary | ICD-10-CM

## 2020-05-12 DIAGNOSIS — E559 Vitamin D deficiency, unspecified: Secondary | ICD-10-CM

## 2020-05-12 DIAGNOSIS — I959 Hypotension, unspecified: Secondary | ICD-10-CM | POA: Diagnosis not present

## 2020-05-12 DIAGNOSIS — Z131 Encounter for screening for diabetes mellitus: Secondary | ICD-10-CM

## 2020-05-12 DIAGNOSIS — D509 Iron deficiency anemia, unspecified: Secondary | ICD-10-CM

## 2020-05-12 DIAGNOSIS — Z681 Body mass index (BMI) 19 or less, adult: Secondary | ICD-10-CM

## 2020-05-12 DIAGNOSIS — R636 Underweight: Secondary | ICD-10-CM

## 2020-05-12 DIAGNOSIS — E785 Hyperlipidemia, unspecified: Secondary | ICD-10-CM

## 2020-05-12 DIAGNOSIS — M6281 Muscle weakness (generalized): Secondary | ICD-10-CM

## 2020-05-12 DIAGNOSIS — D649 Anemia, unspecified: Secondary | ICD-10-CM

## 2020-05-12 DIAGNOSIS — Z0001 Encounter for general adult medical examination with abnormal findings: Secondary | ICD-10-CM | POA: Diagnosis not present

## 2020-05-12 DIAGNOSIS — Z0000 Encounter for general adult medical examination without abnormal findings: Secondary | ICD-10-CM

## 2020-05-12 DIAGNOSIS — N3020 Other chronic cystitis without hematuria: Secondary | ICD-10-CM

## 2020-05-12 DIAGNOSIS — N6011 Diffuse cystic mastopathy of right breast: Secondary | ICD-10-CM

## 2020-05-12 DIAGNOSIS — M87059 Idiopathic aseptic necrosis of unspecified femur: Secondary | ICD-10-CM

## 2020-05-12 DIAGNOSIS — I700 Atherosclerosis of aorta: Secondary | ICD-10-CM

## 2020-05-12 DIAGNOSIS — R7309 Other abnormal glucose: Secondary | ICD-10-CM | POA: Diagnosis not present

## 2020-05-12 DIAGNOSIS — Z1329 Encounter for screening for other suspected endocrine disorder: Secondary | ICD-10-CM

## 2020-05-12 DIAGNOSIS — E441 Mild protein-calorie malnutrition: Secondary | ICD-10-CM | POA: Diagnosis not present

## 2020-05-12 DIAGNOSIS — Z136 Encounter for screening for cardiovascular disorders: Secondary | ICD-10-CM | POA: Diagnosis not present

## 2020-05-12 DIAGNOSIS — E43 Unspecified severe protein-calorie malnutrition: Secondary | ICD-10-CM

## 2020-05-12 MED ORDER — MIRTAZAPINE 15 MG PO TABS
15.0000 mg | ORAL_TABLET | Freq: Every day | ORAL | 2 refills | Status: DC
Start: 2020-05-12 — End: 2020-06-12

## 2020-05-12 NOTE — Patient Instructions (Addendum)
Shelly Barrett , Thank you for taking time to come for your Annual Wellness Visit. I appreciate your ongoing commitment to your health goals. Please review the following plan we discussed and let me know if I can assist you in the future.   These are the goals we discussed: Goals    . DIET - INCREASE WATER INTAKE     Increase from 2 to 4 bottles of water daily    . Gain weight     Close goal 90+ lb; end goal 120lb Three meals with snacks/supplements in between (Aim for 5-6 small meals daily)       This is a list of the screening recommended for you and due dates:  Health Maintenance  Topic Date Due  . COVID-19 Vaccine (1) Never done  . Flu Shot  05/11/2020  . Tetanus Vaccine  11/17/2020  . DEXA scan (bone density measurement)  Completed  . Pneumonia vaccines  Completed  .  Hepatitis C: One time screening is recommended by Center for Disease Control  (CDC) for  adults born from [birth year] through 1964.   Discontinued      High-Protein and High-Calorie Diet Eating high-protein and high-calorie foods can help you to gain weight, heal after an injury, and recover after an illness or surgery. The specific amount of daily protein and calories you need depends on:  Your body weight.  The reason this diet is recommended for you. What is my plan? Generally, a high-protein, high-calorie diet involves:  Eating 250-500 extra calories each day.  Making sure that you get enough of your daily calories from protein. Ask your health care provider how many of your calories should come from protein. Talk with a health care provider, such as a diet and nutrition specialist (dietitian), about how much protein and how many calories you need each day. Follow the diet as directed by your health care provider. What are tips for following this plan?  Preparing meals  Add whole milk, half-and-half, or heavy cream to cereal, pudding, soup, or hot cocoa.  Add whole milk to instant breakfast drinks.  Add  peanut butter to oatmeal or smoothies.  Add powdered milk to baked goods, smoothies, or milkshakes.  Add powdered milk, cream, or butter to mashed potatoes.  Add cheese to cooked vegetables.  Make whole-milk yogurt parfaits. Top them with granola, fruit, or nuts.  Add cottage cheese to your fruit.  Add avocado, cheese, or both to sandwiches or salads.  Add meat, poultry, or seafood to rice, pasta, casseroles, salads, and soups.  Use mayonnaise when making egg salad, chicken salad, or tuna salad.  Use peanut butter as a dip for vegetables or as a topping for pretzels, celery, or crackers.  Add beans to casseroles, dips, and spreads.  Add pureed beans to sauces and soups.  Replace calorie-free drinks with calorie-containing drinks, such as milk and fruit juice.  Replace water with milk or heavy cream when making foods such as oatmeal, pudding, or cocoa. General instructions  Ask your health care provider if you should take a nutritional supplement.  Try to eat six small meals each day instead of three large meals.  Eat a balanced diet. In each meal, include one food that is high in protein.  Keep nutritious snacks available, such as nuts, trail mixes, dried fruit, and yogurt.  If you have kidney disease or diabetes, talk with your health care provider about how much protein is safe for you. Too much protein may put extra  stress on your kidneys.  Drink your calories. Choose high-calorie drinks and have them after your meals. What high-protein foods should I eat?  Vegetables Soybeans. Peas. Grains Quinoa. Bulgur wheat. Meats and other proteins Beef, pork, and poultry. Fish and seafood. Eggs. Tofu. Textured vegetable protein (TVP). Peanut butter. Nuts and seeds. Dried beans. Protein powders. Dairy Whole milk. Whole-milk yogurt. Powdered milk. Cheese. [employer]. Eggnog. Beverages High-protein supplement drinks. Soy milk. Other foods Protein bars. The items listed  above may not be a complete list of high-protein foods and beverages. Contact a dietitian for more options. What high-calorie foods should I eat? Fruits Dried fruit. Fruit leather. Canned fruit in syrup. Fruit juice. Avocado. Vegetables Vegetables cooked in oil or butter. Fried potatoes. Grains Pasta. Quick breads. Muffins. Pancakes. Ready-to-eat cereal. Meats and other proteins Peanut butter. Nuts and seeds. Dairy Heavy cream. Whipped cream. Cream cheese. Sour cream. Ice cream. Custard. Pudding. Beverages Meal-replacement beverages. Nutrition shakes. Fruit juice. Sugar-sweetened soft drinks. Seasonings and condiments Salad dressing. Mayonnaise. Alfredo sauce. Fruit preserves or jelly. Honey. Syrup. Sweets and desserts Cake. Cookies. Pie. Pastries. Candy bars. Chocolate. Fats and oils Butter or margarine. Oil. Gravy. Other foods Meal-replacement bars. The items listed above may not be a complete list of high-calorie foods and beverages. Contact a dietitian for more options. Summary  A high-protein, high-calorie diet can help you gain weight or heal faster after an injury, illness, or surgery.  To increase your protein and calories, add ingredients such as whole milk, peanut butter, cheese, beans, meat, or seafood to meal items.  To get enough extra calories each day, include high-calorie foods and beverages at each meal.  Adding a high-calorie drink or shake can be an easy way to help you get enough calories each day. Talk with your healthcare provider or dietitian about the best options for you. This information is not intended to replace advice given to you by your health care provider. Make sure you discuss any questions you have with your health care provider. Document Revised: 09/09/2017 Document Reviewed: 08/09/2017 Elsevier Patient Education  2020 [employer].

## 2020-05-13 ENCOUNTER — Encounter: Payer: Self-pay | Admitting: Adult Health

## 2020-05-13 DIAGNOSIS — E538 Deficiency of other specified B group vitamins: Secondary | ICD-10-CM | POA: Insufficient documentation

## 2020-05-13 LAB — URINALYSIS, ROUTINE W REFLEX MICROSCOPIC
Bilirubin Urine: NEGATIVE
Glucose, UA: NEGATIVE
Hyaline Cast: NONE SEEN /LPF
Ketones, ur: NEGATIVE
Nitrite: POSITIVE — AB
Specific Gravity, Urine: 1.021 (ref 1.001–1.03)
Squamous Epithelial / HPF: NONE SEEN /HPF (ref <=5)
pH: <=5 (ref 5.0–8.0)

## 2020-05-13 LAB — CBC WITH DIFFERENTIAL/PLATELET
Absolute Monocytes: 178 cells/uL — ABNORMAL LOW (ref 200–950)
Basophils Absolute: 10 cells/uL (ref 0–200)
Basophils Relative: 0.4 %
Eosinophils Absolute: 10 cells/uL — ABNORMAL LOW (ref 15–500)
Eosinophils Relative: 0.4 %
HCT: 42.3 % (ref 35.0–45.0)
Hemoglobin: 13.6 g/dL (ref 11.7–15.5)
Lymphs Abs: 1018 cells/uL (ref 850–3900)
MCH: 30.7 pg (ref 27.0–33.0)
MCHC: 32.2 g/dL (ref 32.0–36.0)
MCV: 95.5 fL (ref 80.0–100.0)
MPV: 11.2 fL (ref 7.5–12.5)
Monocytes Relative: 7.4 %
Neutro Abs: 1186 cells/uL — ABNORMAL LOW (ref 1500–7800)
Neutrophils Relative %: 49.4 %
Platelets: 223 10*3/uL (ref 140–400)
RBC: 4.43 10*6/uL (ref 3.80–5.10)
RDW: 14 % (ref 11.0–15.0)
Total Lymphocyte: 42.4 %
WBC: 2.4 10*3/uL — ABNORMAL LOW (ref 3.8–10.8)

## 2020-05-13 LAB — TSH: TSH: 1.85 mIU/L (ref 0.40–4.50)

## 2020-05-13 LAB — COMPLETE METABOLIC PANEL WITH GFR
AG Ratio: 1.7 (calc) (ref 1.0–2.5)
ALT: 16 U/L (ref 6–29)
AST: 22 U/L (ref 10–35)
Albumin: 4.7 g/dL (ref 3.6–5.1)
Alkaline phosphatase (APISO): 71 U/L (ref 37–153)
BUN/Creatinine Ratio: 43 (calc) — ABNORMAL HIGH (ref 6–22)
BUN: 26 mg/dL — ABNORMAL HIGH (ref 7–25)
CO2: 29 mmol/L (ref 20–32)
Calcium: 10.1 mg/dL (ref 8.6–10.4)
Chloride: 101 mmol/L (ref 98–110)
Creat: 0.61 mg/dL (ref 0.60–0.93)
GFR, Est African American: 101 mL/min/{1.73_m2} (ref >=60)
GFR, Est Non African American: 87 mL/min/{1.73_m2} (ref >=60)
Globulin: 2.8 g/dL (calc) (ref 1.9–3.7)
Glucose, Bld: 123 mg/dL — ABNORMAL HIGH (ref 65–99)
Potassium: 4.3 mmol/L (ref 3.5–5.3)
Sodium: 139 mmol/L (ref 135–146)
Total Bilirubin: 0.6 mg/dL (ref 0.2–1.2)
Total Protein: 7.5 g/dL (ref 6.1–8.1)

## 2020-05-13 LAB — LIPID PANEL
Cholesterol: 263 mg/dL — ABNORMAL HIGH (ref <200)
HDL: 110 mg/dL (ref >=50)
LDL Cholesterol (Calc): 133 mg/dL (calc) — ABNORMAL HIGH
Non-HDL Cholesterol (Calc): 153 mg/dL (calc) — ABNORMAL HIGH (ref <130)
Total CHOL/HDL Ratio: 2.4 (calc) (ref <5.0)
Triglycerides: 92 mg/dL (ref <150)

## 2020-05-13 LAB — HEMOGLOBIN A1C
Hgb A1c MFr Bld: 5.5 % of total Hgb (ref <5.7)
Mean Plasma Glucose: 111 (calc)
eAG (mmol/L): 6.2 (calc)

## 2020-05-13 LAB — MICROALBUMIN / CREATININE URINE RATIO
Creatinine, Urine: 92 mg/dL (ref 20–275)
Microalb Creat Ratio: 50 mcg/mg creat — ABNORMAL HIGH (ref <30)
Microalb, Ur: 4.6 mg/dL

## 2020-05-13 LAB — VITAMIN D 25 HYDROXY (VIT D DEFICIENCY, FRACTURES): Vit D, 25-Hydroxy: 35 ng/mL (ref 30–100)

## 2020-05-13 LAB — MAGNESIUM: Magnesium: 2.4 mg/dL (ref 1.5–2.5)

## 2020-05-13 LAB — FERRITIN: Ferritin: 70 ng/mL (ref 16–288)

## 2020-05-13 LAB — VITAMIN B12: Vitamin B-12: 357 pg/mL (ref 200–1100)

## 2020-06-11 NOTE — Progress Notes (Signed)
   History of Present Illness:     This very nice 79 yo MWF  With long hx/o chronic malnutrition  (5'2" / 76# - BMI 13.9) attributed in past to food fetishes and alleged food allergies. She has been identified with low Vitamin B12 (305) , low serum iron, Nl CBC and hx/o Lactose Intolerance.  She was rx'd Remeron 15 mg qhs 1 month ago and returns for follow-up. Apparently, she took the Remeron 1-2 days and stopped as she felt that it caused diarrhea. Long discussion with patient and her son , Shelly Barrett in attendance, regarding  Her malnourished state and her opposition to recommended treatments. Also, discussion of my recommendations that she & her husband with moderate severe Dementia move into an AL situation.  Medications  .  CALCIUM PO, Take 250 mg by mouth. .  cholecalciferol (VITAMIN D) 1000 UNITS tablet, Take 2,000 Units by mouth daily.  .  mirtazapine (REMERON) 15 MG tablet, Not taking  Problem list She has Carotid stenosis; Hyperlipidemia; Vitamin D deficiency; Fibrocystic breast changes, bilateral; Body mass index (BMI) less than 16.5; Protein-calorie malnutrition, severe ([place]); Aortic atherosclerosis ([place]); Scoliosis of thoracolumbar spine; Normocytic anemia; Hypotension; Bladder infection, chronic; Avascular necrosis of hip ([place]); Severely underweight adult; and B12 deficiency on their problem list.   Observations/Objective:   BP 134/78   Pulse 68   Temp (!) 97.2 F (36.2 C)   Resp 16   Ht 5\' 2"  (1.575 m)   Wt 75 lb 6.4 oz (34.2 kg)   SpO2 97%   BMI 13.79 kg/m   No formal exam with approximately 30 minutes in discussing alternate caloric intakes , supplements, etc and alos recommendations for moving to a structured  Living status. Son has been investigating various facilities in the area.   Assessment and Plan:  1. Hypotension, history  2. Severe protein-calorie malnutrition ([place])  3. B12 deficiency  4. Iron deficiency  5. Generalized muscle  weakness  Follow Up Instructions:        I discussed the assessment and treatment plan with the patient and her son.  Patient was sent a Rx for Low dose Amitriptyline. They were provided an opportunity to ask questions and all were answered. They agreed with the plan and demonstrated an understanding of the instructions. Between 30-35 minutes of counseling, chart review, and critical decision making was performed      The patient was advised to call back or seek an in-person evaluation if the symptoms worsen or if the condition fails to improve as anticipated.   Shelly Bouchard, MD

## 2020-06-12 ENCOUNTER — Other Ambulatory Visit: Payer: Self-pay

## 2020-06-12 ENCOUNTER — Ambulatory Visit (INDEPENDENT_AMBULATORY_CARE_PROVIDER_SITE_OTHER): Payer: PPO | Admitting: Internal Medicine

## 2020-06-12 VITALS — BP 134/78 | HR 68 | Temp 97.2°F | Resp 16 | Ht 62.0 in | Wt 75.4 lb

## 2020-06-12 DIAGNOSIS — E611 Iron deficiency: Secondary | ICD-10-CM

## 2020-06-12 DIAGNOSIS — E43 Unspecified severe protein-calorie malnutrition: Secondary | ICD-10-CM

## 2020-06-12 DIAGNOSIS — M6281 Muscle weakness (generalized): Secondary | ICD-10-CM | POA: Diagnosis not present

## 2020-06-12 DIAGNOSIS — E538 Deficiency of other specified B group vitamins: Secondary | ICD-10-CM | POA: Diagnosis not present

## 2020-06-12 DIAGNOSIS — I959 Hypotension, unspecified: Secondary | ICD-10-CM

## 2020-06-12 MED ORDER — AMITRIPTYLINE HCL 10 MG PO TABS: ORAL_TABLET | ORAL | 0 refills | Status: DC

## 2020-06-14 ENCOUNTER — Encounter: Payer: Self-pay | Admitting: Internal Medicine

## 2020-07-16 ENCOUNTER — Ambulatory Visit: Payer: PPO | Admitting: Internal Medicine

## 2020-08-05 ENCOUNTER — Other Ambulatory Visit: Payer: Self-pay | Admitting: Adult Health

## 2020-09-12 ENCOUNTER — Other Ambulatory Visit: Payer: Self-pay | Admitting: Internal Medicine

## 2020-09-12 DIAGNOSIS — E43 Unspecified severe protein-calorie malnutrition: Secondary | ICD-10-CM

## 2020-09-12 DIAGNOSIS — R310 Gross hematuria: Secondary | ICD-10-CM

## 2020-09-12 MED ORDER — AMITRIPTYLINE HCL 10 MG PO TABS: ORAL_TABLET | ORAL | 0 refills | Status: DC

## 2020-09-12 MED ORDER — NITROFURANTOIN MONOHYD MACRO 100 MG PO CAPS: 100.0000 mg | ORAL_CAPSULE | Freq: Two times a day (BID) | ORAL | 0 refills | Status: AC

## 2020-10-07 ENCOUNTER — Other Ambulatory Visit: Payer: Self-pay

## 2020-10-07 ENCOUNTER — Ambulatory Visit (INDEPENDENT_AMBULATORY_CARE_PROVIDER_SITE_OTHER): Payer: PPO | Admitting: Adult Health Nurse Practitioner

## 2020-10-07 ENCOUNTER — Encounter: Payer: Self-pay | Admitting: Adult Health Nurse Practitioner

## 2020-10-07 VITALS — BP 110/64 | HR 64 | Temp 97.6°F | Wt 81.6 lb

## 2020-10-07 DIAGNOSIS — R600 Localized edema: Secondary | ICD-10-CM | POA: Diagnosis not present

## 2020-10-07 DIAGNOSIS — E43 Unspecified severe protein-calorie malnutrition: Secondary | ICD-10-CM | POA: Diagnosis not present

## 2020-10-07 NOTE — Progress Notes (Signed)
Assessment and Plan:  Shelly Barrett was seen today for swelling in both feet.  Diagnoses and all orders for this visit:  Protein-calorie malnutrition, severe (HCC) Continue high fat diet Encourage small frequent meals. Continue increase water intake  Bilateral lower extremity edema Low sodium Compression stockings thigh high, apply in morning and take off every night Increase water intake Elevated legs above heart when sitting Consider lasix short term dose?  Contact office with any new or worsening symptoms.     Further disposition pending results of labs. Discussed med's effects and SE's.   Over 30 minutes of face to face interview, exam, counseling, chart review, and critical decision making was performed.   Future Appointments  Date Time Provider Department Center  10/17/2020  1:30 PM [facility]  01/28/2021  9:00 AM Lucky Cowboy, MD [facility] None  05/12/2021  9:00 AM Judd Gaudier, NP [facility] None    ------------------------------------------------------------------------------------------------------------------   HPI 79 y.o.female presents for evaluation of swollen red feet that have started over a week ago.  Reports the swelling is localized around bilateral ankles.  Patient is here with son, Renata Caprice.  She ambulates with a single prong cane.  Her feet are swollen over her shoes.  She denies any claudication or change in sensation.   Son reports the shoes she wears in the house, slippers, are non restrictive to her feet.  Past Medical History:  Diagnosis Date  . Arthritis   . E coli bacteremia 07/03/2019  . Hyperlipidemia   . Ileus (HCC) 07/03/2019  . Left femoral hernia without obstruction or gangrene 12/14/2017   On CT 12/13/2017  . Prediabetes   . Scoliosis   . Sepsis (HCC) 07/02/2019  . Sigmoid diverticulitis   . Vitamin D deficiency      Allergies  Allergen Reactions  . Betadine [Povidone Iodine]   . Ciprocinonide  [Fluocinolone]   . Ciprofloxacin Hcl     Hives  . Flexeril [Cyclobenzaprine]   . Ibuprofen   . Lactose Intolerance (Gi)     Diarrhea, cramping  . Macrobid [Nitrofurantoin Macrocrystal] Diarrhea, Nausea And Vomiting and Swelling  . Naprosyn [Naproxen]   . Penicillins     REACTION: rash Documentation from [facility] in 2012 patient was on zosyn then on keflex without known adverse reaction  . Shellfish Allergy   . Sodium Benzoate [Nutritional Supplements]   . Iodinated Diagnostic Agents Rash    Current Outpatient Medications on File Prior to Visit  Medication Sig  . amitriptyline (ELAVIL) 10 MG tablet Take 1 tablet     3 x /day      with Meals  . cholecalciferol (VITAMIN D) 1000 UNITS tablet Take 2,000 Units by mouth daily.    No current facility-administered medications on file prior to visit.    ROS: all negative except above.   Physical Exam:  BP 110/64   Pulse 64   Temp 97.6 F (36.4 C)   Wt 81 lb 9.6 oz (37 kg)   SpO2 96%   BMI 14.92 kg/m   General Appearance: Well nourished, in no apparent distress. Eyes: PERRLA, EOMs, conjunctiva no swelling or erythema Sinuses: No Frontal/maxillary tenderness ENT/Mouth: Ext aud canals clear, TMs without erythema, bulging. No erythema, swelling, or exudate on post pharynx.  Tonsils not swollen or erythematous. Hearing normal.  Neck: Supple, thyroid normal.  Respiratory: Respiratory effort normal, BS equal bilaterally without rales, rhonchi, wheezing or stridor.  Cardio: RRR with no MRGs. Brisk peripheral pulses, +2 pitting edema localized bilateral  ankles, left worse than right. Abdomen: Soft, + BS.  Non tender, no guarding, rebound, hernias, masses. Lymphatics: Non tender without lymphadenopathy.  Musculoskeletal: Full ROM, 5/5 strength, normal gait.  Skin: Warm, dry without rashes, lesions, ecchymosis.  Neuro: Cranial nerves intact. Normal muscle tone, no cerebellar symptoms. Sensation intact.  Psych: Awake and oriented X 3,  normal affect, Insight and Judgment appropriate.     Elder Negus, NP 12:41 PM [facility]

## 2020-10-17 ENCOUNTER — Ambulatory Visit: Payer: PPO | Attending: Internal Medicine

## 2020-10-17 DIAGNOSIS — Z23 Encounter for immunization: Secondary | ICD-10-CM

## 2020-10-17 NOTE — Progress Notes (Signed)
   Covid-19 Vaccination Clinic  Name:  Shelly Barrett    MRN: [id number] DOB: [birth year]  10/17/2020  Shelly Barrett was observed post Covid-19 immunization for 15 minutes without incident. She was provided with Vaccine Information Sheet and instruction to access the V-Safe system.   Shelly Barrett was instructed to call 911 with any severe reactions post vaccine: Marland Kitchen Difficulty breathing  . Swelling of face and throat  . A fast heartbeat  . A bad rash all over body  . Dizziness and weakness   Immunizations Administered    Name Date Dose VIS Date Route   Pfizer COVID-19 Vaccine 10/17/2020  2:10 PM 0.3 mL 07/30/2020 Intramuscular   Manufacturer: [employer]   Lot: [id number]   [place]: [id number]

## 2020-11-01 IMAGING — CR DG ABDOMEN 1V
1 series · 1 of 1 positions shown · non-contrast
Comparison: 07/03/2019

CLINICAL DATA: Abdominal swelling and constipation.

EXAM:
ABDOMEN - 1 VIEW

[t abdomen supine]
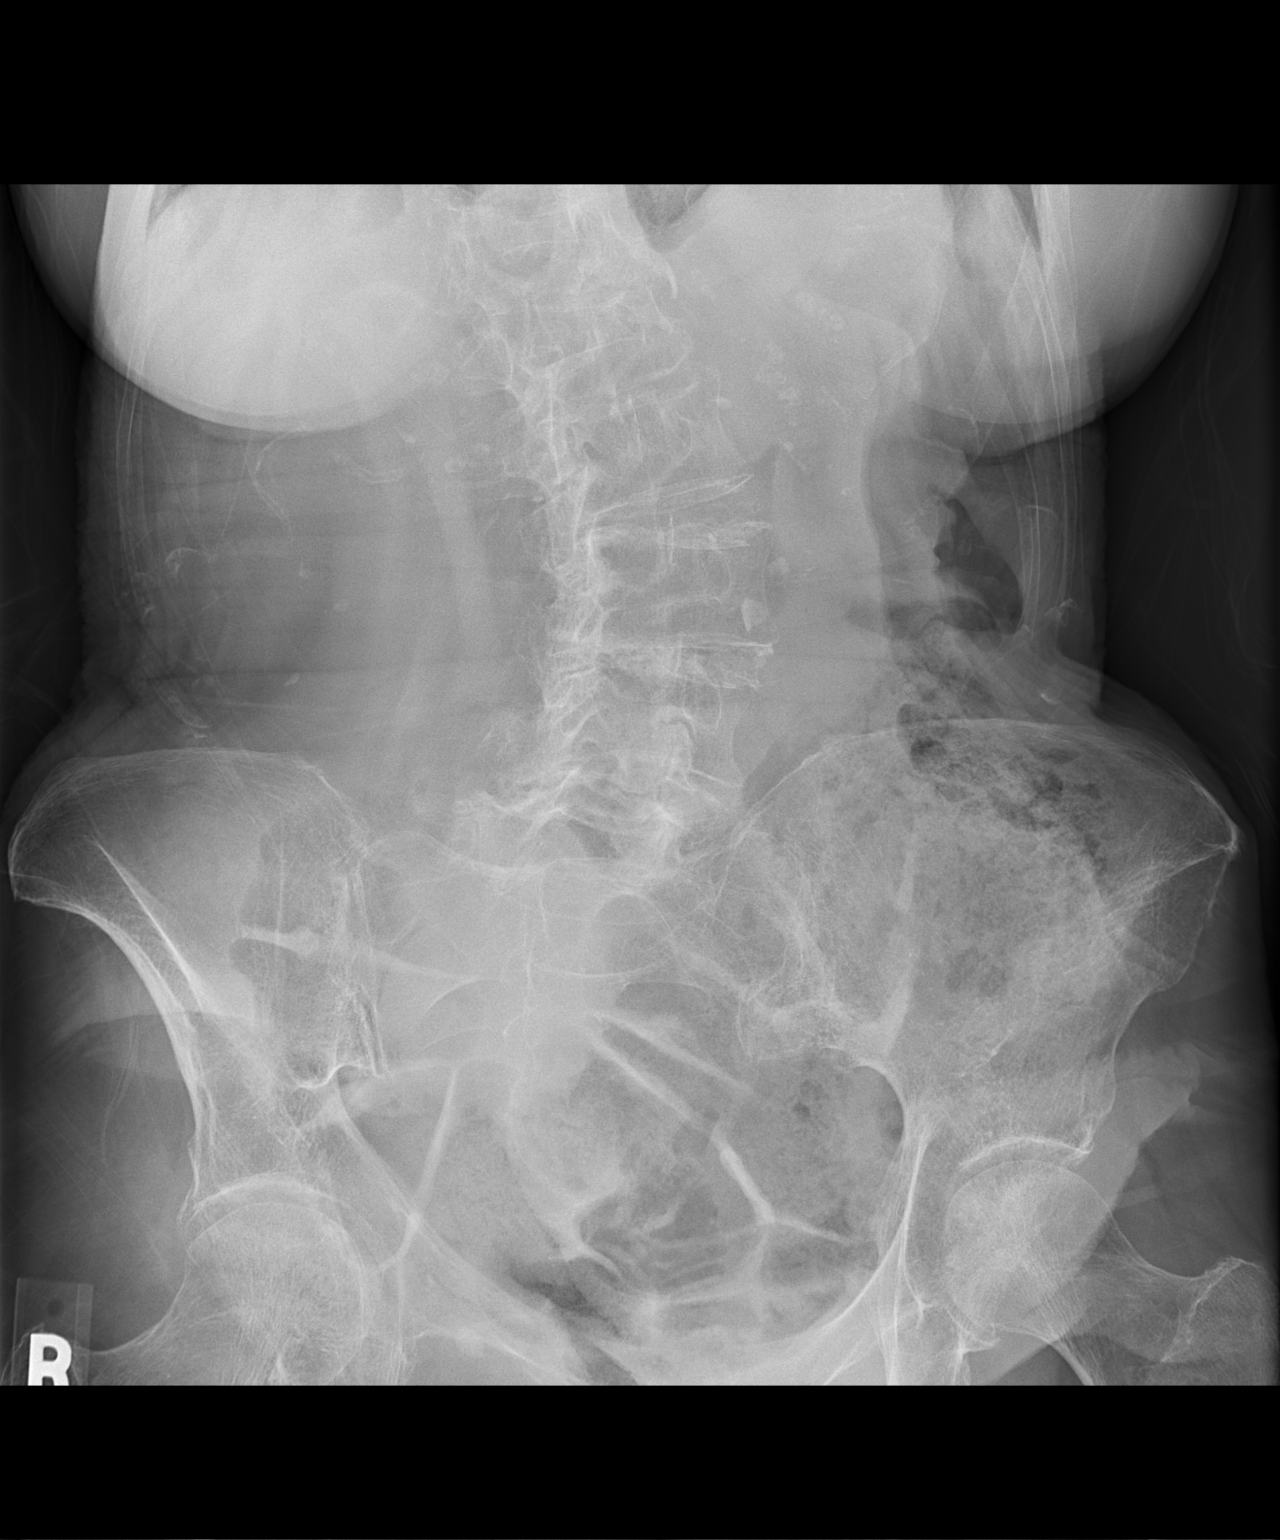

[1 of 1 positions shown; findings below may reference images not displayed]

FINDINGS: Large amount of fecal matter in the rectosigmoid colon. Small bowel
pattern is unremarkable. No acute soft tissue calcifications. There
is chronic scoliosis.
IMPRESSION: Large amount of stool in the rectosigmoid region. No other finding
of note.

## 2020-12-08 ENCOUNTER — Other Ambulatory Visit: Payer: Self-pay | Admitting: Internal Medicine

## 2020-12-08 DIAGNOSIS — E43 Unspecified severe protein-calorie malnutrition: Secondary | ICD-10-CM

## 2021-01-28 ENCOUNTER — Ambulatory Visit (INDEPENDENT_AMBULATORY_CARE_PROVIDER_SITE_OTHER): Payer: PPO | Admitting: Internal Medicine

## 2021-01-28 ENCOUNTER — Other Ambulatory Visit: Payer: Self-pay

## 2021-01-28 VITALS — BP 104/56 | HR 81 | Temp 97.3°F | Resp 16 | Ht 62.0 in | Wt 83.8 lb

## 2021-01-28 DIAGNOSIS — R0989 Other specified symptoms and signs involving the circulatory and respiratory systems: Secondary | ICD-10-CM | POA: Diagnosis not present

## 2021-01-28 DIAGNOSIS — R7309 Other abnormal glucose: Secondary | ICD-10-CM | POA: Diagnosis not present

## 2021-01-28 DIAGNOSIS — E611 Iron deficiency: Secondary | ICD-10-CM | POA: Diagnosis not present

## 2021-01-28 DIAGNOSIS — F40232 Fear of other medical care: Secondary | ICD-10-CM | POA: Diagnosis not present

## 2021-01-28 DIAGNOSIS — E538 Deficiency of other specified B group vitamins: Secondary | ICD-10-CM | POA: Diagnosis not present

## 2021-01-28 DIAGNOSIS — I700 Atherosclerosis of aorta: Secondary | ICD-10-CM | POA: Diagnosis not present

## 2021-01-28 DIAGNOSIS — E43 Unspecified severe protein-calorie malnutrition: Secondary | ICD-10-CM

## 2021-01-28 DIAGNOSIS — Z79899 Other long term (current) drug therapy: Secondary | ICD-10-CM | POA: Diagnosis not present

## 2021-01-28 DIAGNOSIS — E782 Mixed hyperlipidemia: Secondary | ICD-10-CM | POA: Diagnosis not present

## 2021-01-28 DIAGNOSIS — E559 Vitamin D deficiency, unspecified: Secondary | ICD-10-CM

## 2021-01-28 NOTE — Patient Instructions (Signed)
High-Protein and High-Calorie Diet  Eating high-protein and high-calorie foods can help you to gain weight, heal after an injury, and recover after an illness or surgery. The specific amount of daily protein and calories you need depends on:  Your body weight.  The reason this diet is recommended for you. What is my plan? Generally, a high-protein, high-calorie diet involves:  Eating 250-500 extra calories each day.  Making sure that you get enough of your daily calories from protein. Ask your health care provider how many of your calories should come from protein. Talk with a health care provider, such as a diet and nutrition specialist (dietitian), about how much protein and how many calories you need each day. Follow the diet as directed by your health care provider. What are tips for following this plan? Preparing meals  Add whole milk, half-and-half, or heavy cream to cereal, pudding, soup, or hot cocoa.  Add whole milk to instant breakfast drinks.  Add peanut butter to oatmeal or smoothies.  Add powdered milk to baked goods, smoothies, or milkshakes.  Add powdered milk, cream, or butter to mashed potatoes.  Add cheese to cooked vegetables.  Make whole-milk yogurt parfaits. Top them with granola, fruit, or nuts.  Add cottage cheese to your fruit.  Add avocado, cheese, or both to sandwiches or salads.  Add meat, poultry, or seafood to rice, pasta, casseroles, salads, and soups.  Use mayonnaise when making egg salad, chicken salad, or tuna salad.  Use peanut butter as a dip for vegetables or as a topping for pretzels, celery, or crackers.  Add beans to casseroles, dips, and spreads.  Add pureed beans to sauces and soups.  Replace calorie-free drinks with calorie-containing drinks, such as milk and fruit juice.  Replace water with milk or heavy cream when making foods such as oatmeal, pudding, or cocoa. General instructions  Ask your health care provider if you  should take a nutritional supplement.  Try to eat six small meals each day instead of three large meals.  Eat a balanced diet. In each meal, include one food that is high in protein.  Keep nutritious snacks available, such as nuts, trail mixes, dried fruit, and yogurt.  If you have kidney disease or diabetes, talk with your health care provider about how much protein is safe for you. Too much protein may put extra stress on your kidneys.  Drink your calories. Choose high-calorie drinks and have them after your meals.  What high-protein foods should I eat? Vegetables Soybeans. Peas. Grains Quinoa. Bulgur wheat. Meats and other proteins Beef, pork, and poultry. Fish and seafood. Eggs. Tofu. Textured vegetable protein (TVP). Peanut butter. Nuts and seeds. Dried beans. Protein powders. Dairy Whole milk. Whole-milk yogurt. Powdered milk. Cheese. [employer]. Eggnog. Beverages High-protein supplement drinks. Soy milk. Other foods Protein bars. The items listed above may not be a complete list of high-protein foods and beverages. Contact a dietitian for more options.  What high-calorie foods should I eat? Fruits Dried fruit. Fruit leather. Canned fruit in syrup. Fruit juice. Avocado. Vegetables Vegetables cooked in oil or butter. Fried potatoes. Grains Pasta. Quick breads. Muffins. Pancakes. Ready-to-eat cereal. Meats and other proteins Peanut butter. Nuts and seeds. Dairy Heavy cream. Whipped cream. Cream cheese. Sour cream. Ice cream. Custard. Pudding. Beverages Meal-replacement beverages. Nutrition shakes. Fruit juice. Sugar-sweetened soft drinks. Seasonings and condiments Salad dressing. Mayonnaise. Alfredo sauce. Fruit preserves or jelly. Honey. Syrup. Sweets and desserts Cake. Cookies. Pie. Pastries. Candy bars. Chocolate. Fats and oils Butter or  margarine. Oil. Gravy. Other foods Meal-replacement bars. The items listed above may not be a complete list of high-calorie  foods and beverages. Contact a dietitian for more options. Summary  A high-protein, high-calorie diet can help you gain weight or heal faster after an injury, illness, or surgery.  To increase your protein and calories, add ingredients such as whole milk, peanut butter, cheese, beans, meat, or seafood to meal items.  To get enough extra calories each day, include high-calorie foods and beverages at each meal.  Adding a high-calorie drink or shake can be an easy way to help you get enough calories each day. Talk with your healthcare provider or dietitian about the best options for you.   =========================  Due to recent changes in healthcare laws, you may see the results of your imaging and laboratory studies on MyChart before your provider has had a chance to review them.  We understand that in some cases there may be results that are confusing or concerning to you. Not all laboratory results come back in the same time frame and the provider may be waiting for multiple results in order to interpret others.  Please give [place] 48 hours in order for your provider to thoroughly review all the results before contacting the office for clarification of your results.     ++++++++++++++++++++++++++++++++++  Vit D  & Vit C 1,000 mg   are recommended to help protect  against the Covid-19 and other Corona viruses.    Also it's recommended  to take  Zinc 50 mg  to help  protect against the Covid-19   and best place to get  is also on [employer].com  and don't pay more than 6-8 cents /pill !   ===================================== Coronavirus (COVID-19) Are you at risk?  Are you at risk for the Coronavirus (COVID-19)?  To be considered HIGH RISK for Coronavirus (COVID-19), you have to meet the following criteria:  . Traveled to [place], [place], [place], [place] or [place]; or in the [place] to [place], [place], [place]  . or [place]; and have fever, cough, and shortness of  breath within the last 2 weeks of travel OR . Been in close contact with a person diagnosed with COVID-19 within the last 2 weeks and have  . fever, cough,and shortness of breath .  . IF YOU DO NOT MEET THESE CRITERIA, YOU ARE CONSIDERED LOW RISK FOR COVID-19.  What to do if you are HIGH RISK for COVID-19?  Marland Kitchen If you are having a medical emergency, call 911. . Seek medical care right away. Before you go to a doctor's office, urgent care or emergency department, .  call ahead and tell them about your recent travel, contact with someone diagnosed with COVID-19  .  and your symptoms.  . You should receive instructions from your physician's office regarding next steps of care.  . When you arrive at healthcare provider, tell the healthcare staff immediately you have returned from  . visiting [place], [place], [place], [place] or [place]; or traveled in the [place] to [place], [place],  . [place] or [place] in the last two weeks or you have been in close contact with a person diagnosed with  . COVID-19 in the last 2 weeks.   . Tell the health care staff about your symptoms: fever, cough and shortness of breath. . After you have been seen by a medical provider, you will be either: o Tested for (COVID-19) and discharged  home on quarantine except to seek medical care if  o symptoms worsen, and asked to  - Stay home and avoid contact with others until you get your results (4-5 days)  - Avoid travel on public transportation if possible (such as bus, train, or airplane) or o Sent to the Emergency Department by EMS for evaluation, COVID-19 testing  and  o possible admission depending on your condition and test results.  What to do if you are LOW RISK for COVID-19?  Reduce your risk of any infection by using the same precautions used for avoiding the common cold or flu:  Marland Kitchen Wash your hands often with soap and warm water for at least 20 seconds.  If soap and water are not readily available,   . use an alcohol-based hand sanitizer with at least 60% alcohol.  . If coughing or sneezing, cover your mouth and nose by coughing or sneezing into the elbow areas of your shirt or coat, .  into a tissue or into your sleeve (not your hands). . Avoid shaking hands with others and consider head nods or verbal greetings only. . Avoid touching your eyes, nose, or mouth with unwashed hands.  . Avoid close contact with people who are sick. . Avoid places or events with large numbers of people in one location, like concerts or sporting events. . Carefully consider travel plans you have or are making. . If you are planning any travel outside or inside the [place], visit the CDC's Travelers' Health webpage for the latest health notices. . If you have some symptoms but not all symptoms, continue to monitor at home and seek medical attention  . if your symptoms worsen. . If you are having a medical emergency, call 911.   ++++++++++++++++++++++++++++++++ Recommend Adult Low Dose Aspirin or  coated  Aspirin 81 mg daily  To reduce risk of Colon Cancer 40 %,  Skin Cancer 26 % ,  Melanoma 46%  and  Pancreatic cancer 60% ++++++++++++++++++++++++++++++++ Vitamin D goal  is between 70-100.  Please make sure that you are taking your Vitamin D as directed.  It is very important as a natural anti-inflammatory  helping hair, skin, and nails, as well as reducing stroke and heart attack risk.  It helps your bones and helps with mood. It also decreases numerous cancer risks so please take it as directed.  Low Vit D is associated with a 200-300% higher risk for CANCER  and 200-300% higher risk for HEART   ATTACK  &  STROKE.   .....................................Marland Kitchen It is also associated with higher death rate at younger ages,  autoimmune diseases like Rheumatoid arthritis, Lupus, Multiple Sclerosis.    Also many other serious conditions, like depression, Alzheimer's Dementia, infertility, muscle aches, fatigue,  fibromyalgia - just to name a few. ++++++++++++++++++++ Recommend the book "The END of DIETING" by Dr Excell Seltzer  & the book "The END of DIABETES " by Dr Excell Seltzer At [email or web address] - get book & Audio CD's    Being diabetic has a  300% increased risk for heart attack, stroke, cancer, and alzheimer- type vascular dementia. It is very important that you work harder with diet by avoiding all foods that are white. Avoid white rice (brown & wild rice is OK), white potatoes (sweetpotatoes in moderation is OK), White bread or wheat bread or anything made out of white flour like bagels, donuts, rolls, buns, biscuits, cakes, pastries, cookies, pizza crust, and pasta (made from white flour & egg whites) - vegetarian  pasta or spinach or wheat pasta is OK. Multigrain breads like Arnold's or Pepperidge Farm, or multigrain sandwich thins or flatbreads.  Diet, exercise and weight loss can reverse and cure diabetes in the early stages.  Diet, exercise and weight loss is very important in the control and prevention of complications of diabetes which affects every system in your body, ie. Brain - dementia/stroke, eyes - glaucoma/blindness, heart - heart attack/heart failure, kidneys - dialysis, stomach - gastric paralysis, intestines - malabsorption, nerves - severe painful neuritis, circulation - gangrene & loss of a leg(s), and finally cancer and Alzheimers.    I recommend avoid fried & greasy foods,  sweets/candy, white rice (brown or wild rice or Quinoa is OK), white potatoes (sweet potatoes are OK) - anything made from white flour - bagels, doughnuts, rolls, buns, biscuits,white and wheat breads, pizza crust and traditional pasta made of white flour & egg white(vegetarian pasta or spinach or wheat pasta is OK).  Multi-grain bread is OK - like multi-grain flat bread or sandwich thins. Avoid alcohol in excess. Exercise is also important.    Eat all the vegetables you want - avoid meat, especially red meat and dairy -  especially cheese.  Cheese is the most concentrated form of trans-fats which is the worst thing to clog up our arteries. Veggie cheese is OK which can be found in the fresh produce section at [employer] or [employer] or [employer]  +++++++++++++++++++++ DASH Eating Plan  DASH stands for "Dietary Approaches to Stop Hypertension."   The DASH eating plan is a healthy eating plan that has been shown to reduce high blood pressure (hypertension). Additional health benefits may include reducing the risk of type 2 diabetes mellitus, heart disease, and stroke. The DASH eating plan may also help with weight loss. WHAT DO I NEED TO KNOW ABOUT THE DASH EATING PLAN? For the DASH eating plan, you will follow these general guidelines:  Choose foods with a percent daily value for sodium of less than 5% (as listed on the food label).  Use salt-free seasonings or herbs instead of table salt or sea salt.  Check with your health care provider or pharmacist before using salt substitutes.  Eat lower-sodium products, often labeled as "lower sodium" or "no salt added."  Eat fresh foods.  Eat more vegetables, fruits, and low-fat dairy products.  Choose whole grains. Look for the word "whole" as the first word in the ingredient list.  Choose fish   Limit sweets, desserts, sugars, and sugary drinks.  Choose heart-healthy fats.  Eat veggie cheese   Eat more home-cooked food and less restaurant, buffet, and fast food.  Limit fried foods.  Cook foods using methods other than frying.  Limit canned vegetables. If you do use them, rinse them well to decrease the sodium.  When eating at a restaurant, ask that your food be prepared with less salt, or no salt if possible.                      WHAT FOODS CAN I EAT? Read Dr Fara Olden Fuhrman's books on The End of Dieting & The End of Diabetes  Grains Whole grain or whole wheat bread. Brown rice. Whole grain or whole wheat pasta. Quinoa, bulgur, and whole  grain cereals. Low-sodium cereals. Corn or whole wheat flour tortillas. Whole grain cornbread. Whole grain crackers. Low-sodium crackers.  Vegetables Fresh or frozen vegetables (raw, steamed, roasted, or grilled). Low-sodium or reduced-sodium tomato and vegetable juices. Low-sodium or reduced-sodium  tomato sauce and paste. Low-sodium or reduced-sodium canned vegetables.   Fruits All fresh, canned (in natural juice), or frozen fruits.  Protein Products  All fish and seafood.  Dried beans, peas, or lentils. Unsalted nuts and seeds. Unsalted canned beans.  Dairy Low-fat dairy products, such as skim or 1% milk, 2% or reduced-fat cheeses, low-fat ricotta or cottage cheese, or plain low-fat yogurt. Low-sodium or reduced-sodium cheeses.  Fats and Oils Tub margarines without trans fats. Light or reduced-fat mayonnaise and salad dressings (reduced sodium). Avocado. Safflower, olive, or canola oils. Natural peanut or almond butter.  Other Unsalted popcorn and pretzels. The items listed above may not be a complete list of recommended foods or beverages. Contact your dietitian for more options.  +++++++++++++++  WHAT FOODS ARE NOT RECOMMENDED? Grains/ White flour or wheat flour White bread. White pasta. White rice. Refined cornbread. Bagels and croissants. Crackers that contain trans fat.  Vegetables  Creamed or fried vegetables. Vegetables in a . Regular canned vegetables. Regular canned tomato sauce and paste. Regular tomato and vegetable juices.  Fruits Dried fruits. Canned fruit in light or heavy syrup. Fruit juice.  Meat and Other Protein Products Meat in general - RED meat & White meat.  Fatty cuts of meat. Ribs, chicken wings, all processed meats as bacon, sausage, bologna, salami, fatback, hot dogs, bratwurst and packaged luncheon meats.  Dairy Whole or 2% milk, cream, half-and-half, and cream cheese. Whole-fat or sweetened yogurt. Full-fat cheeses or blue cheese. Non-dairy creamers  and whipped toppings. Processed cheese, cheese spreads, or cheese curds.  Condiments Onion and garlic salt, seasoned salt, table salt, and sea salt. Canned and packaged gravies. Worcestershire sauce. Tartar sauce. Barbecue sauce. Teriyaki sauce. Soy sauce, including reduced sodium. Steak sauce. Fish sauce. Oyster sauce. Cocktail sauce. Horseradish. Ketchup and mustard. Meat flavorings and tenderizers. Bouillon cubes. Hot sauce. Tabasco sauce. Marinades. Taco seasonings. Relishes.  Fats and Oils Butter, stick margarine, lard, shortening and bacon fat. Coconut, palm kernel, or palm oils. Regular salad dressings.  Pickles and olives. Salted popcorn and pretzels.  The items listed above may not be a complete list of foods and beverages to avoid.

## 2021-01-28 NOTE — Progress Notes (Signed)
Future Appointments  Date Time Provider [place]  01/28/2021  9:00 AM [name], MD [facility] None  05/12/2021   CPE  9:00 AM Liane Comber, NP [facility] None    History of Present Illness:       This very nice 80 y.o. MWF presents for 3 month follow up with labile HTN, HLD, Pre-Diabetes and Vitamin D Deficiency.  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   Patient has a Therapist, sports with her from 9a to 5p daily.  She has chronic LBP from from DJD/DDD and moderately severe thoracolumbar  scoliosis & she has refuses to take prescribed Gabapentin after reading " possible side-effects" . She also even refuses to  try Tylenol.   She had an EDSI for L4/L5 HNP in 2002.  She has regular appt's with a Chiropractor  for "adjustments".  CT scan on 12/14/2017 showed Aortic Atherosclerosis.         Patient also has long hx/o chronic malnutrition  (5'2" /76# - BMI 13.9) from poor intake attributed  to food fetishes and "alleged" food allergies. She has has hx/o low Vit B12 (305) , low serum iron, Nl CBC and hx/o Lactose Intolerance.  She was prescribed  Remeron for appetite which she stopped stopped claiming  it caused diarrhea.  Wt Readings from Last 3 Encounters:  01/28/21 83 lb 12.8 oz (38 kg) - today   10/07/20 81 lb 9.6 oz (37 kg)  06/12/20 75 lb 6.4 oz (34.2 kg)  05/12/20 (!) 76 lb (34.5 kg)         Patient is treated for HTN & BP has been controlled at home. Today's BPis 104.56.Marland Kitchen Patient has had no complaints of any cardiac type chest pain, palpitations, dyspnea / orthopnea / PND, dizziness, claudication, or dependent edema.       Hyperlipidemia is not controlled with diet as she refuses meds for Cholesterol.   Last Lipids were not at goal:  Lab Results  Component Value Date   CHOL 263 (H) 05/12/2020   HDL 110 05/12/2020   LDLCALC 133 (H)  05/12/2020   TRIG 92 05/12/2020   CHOLHDL 2.4 05/12/2020     Also, the patient has history of PreDiabetes and has had no symptoms of reactive hypoglycemia, diabetic polys, paresthesias or visual blurring.  Last A1c was normal & at goal:  Lab Results  Component Value Date   HGBA1C 5.5 05/12/2020            Further, the patient also has history of Vitamin D Deficiency and supplements vitamin D without any suspected side-effects. Last vitamin D was still very low as she sporadically takes lower dose than has been recommended:  Lab Results  Component Value Date   VD25OH 35 05/12/2020     Current Outpatient Medications on File Prior to Visit  Medication Sig  . VITAMIN D 1000 UNITS  Take 2,000 Units daily.   Marland Kitchen amitriptyline (ELAVIL) 10 MG  Patient not taking: Reported on 01/28/2021)     Allergies  Allergen Reactions  . Betadine [Povidone Iodine]   . Ciprocinonide [Fluocinolone]   . Ciprofloxacin Hcl     Hives  . Flexeril [Cyclobenzaprine]   . Ibuprofen   . Lactose Intolerance (Gi)     Diarrhea, cramping  . Macrobid [Nitrofurantoin Macrocrystal] Diarrhea, Nausea And Vomiting and Swelling  . Naprosyn [Naproxen]   .  Penicillins     REACTION: rash Documentation from [place] in 2012 patient was on zosyn then on keflex without known adverse reaction  . Shellfish Allergy   . Sodium Benzoate [Nutritional Supplements]   . Iodinated Diagnostic Agents Rash    PMHx:   Past Medical History:  Diagnosis Date  . Arthritis   . E coli bacteremia 07/03/2019  . Hyperlipidemia   . Ileus ([place]) 07/03/2019  . Left femoral hernia without obstruction or gangrene 12/14/2017   On CT 12/13/2017  . Prediabetes   . Scoliosis   . Sepsis ([place]) 07/02/2019  . Sigmoid diverticulitis   . Vitamin D deficiency     Immunization History  Administered Date(s) Administered  . Influenza, High Dose Seasonal PF 07/30/2014, 06/25/2015, 07/01/2016, 07/12/2017, 07/05/2018, 06/21/2019  . Influenza-Unspecified  06/20/2013  . PFIZER(Purple Top)SARS-COV-2 Vaccination 10/17/2020  . Pneumococcal Conjugate-13 09/18/2014  . Pneumococcal-Unspecified 09/16/2009  . Tdap 11/17/2010  . Zoster 09/16/2009    Past Surgical History:  Procedure Laterality Date  . [place], 2012   implants  . COLONOSCOPY    . TONSILLECTOMY AND ADENOIDECTOMY    . TUBAL LIGATION      FHx:    Reviewed / unchanged  SHx:    Reviewed / unchanged   Systems Review:  Constitutional: Denies fever, chills, wt changes, headaches, insomnia, fatigue, night sweats, change in appetite. Eyes: Denies redness, blurred vision, diplopia, discharge, itchy, watery eyes.  ENT: Denies discharge, congestion, post nasal drip, epistaxis, sore throat, earache, hearing loss, dental pain, tinnitus, vertigo, sinus pain, snoring.  CV: Denies chest pain, palpitations, irregular heartbeat, syncope, dyspnea, diaphoresis, orthopnea, PND, claudication or edema. Respiratory: denies cough, dyspnea, DOE, pleurisy, hoarseness, laryngitis, wheezing.  Gastrointestinal: Denies dysphagia, odynophagia, heartburn, reflux, water brash, abdominal pain or cramps, nausea, vomiting, bloating, diarrhea, constipation, hematemesis, melena, hematochezia  or hemorrhoids. Genitourinary: Denies dysuria, frequency, urgency, nocturia, hesitancy, discharge, hematuria or flank pain. Musculoskeletal: Denies arthralgias, myalgias, stiffness, jt. swelling, pain, limping or strain/sprain.  Skin: Denies pruritus, rash, hives, warts, acne, eczema or change in skin lesion(s). Neuro: No weakness, tremor, incoordination, spasms, paresthesia or pain. Psychiatric: Denies confusion, memory loss or sensory loss. Endo: Denies change in weight, skin or hair change.  Heme/Lymph: No excessive bleeding, bruising or enlarged lymph nodes.  Physical Exam  BP (!) 104/56   Pulse 81   Temp (!) 97.3 F (36.3 C)   Resp 16   Ht 5\' 2"  (1.575 m)   Wt 83 lb 12.8 oz (38 kg)   SpO2 97%   BMI  15.33 kg/m   Appears wasted & chronically ill  and in no distress.  Eyes: PERRLA, EOMs, conjunctiva no swelling or erythema. Sinuses: No frontal/maxillary tenderness ENT/Mouth: EAC's clear, TM's nl w/o erythema, bulging. Nares clear w/o erythema, swelling, exudates. Oropharynx clear without erythema or exudates. Oral hygiene is good. Tongue normal, non obstructing. Hearing intact.  Neck: Supple. Thyroid not palpable. Car 2+/2+ without bruits, nodes or JVD. Chest: Respirations nl with BS clear & equal w/o rales, rhonchi, wheezing or stridor.  Cor: Heart sounds normal w/ regular rate and rhythm without sig. murmurs, gallops, clicks or rubs. Peripheral pulses normal and equal  without edema.  Abdomen: Soft & bowel sounds normal. Non-tender w/o guarding, rebound, hernias, masses or organomegaly.  Lymphatics: Unremarkable.  Musculoskeletal: Full ROM all peripheral extremities, moderate thoracolumbar scoliosis. Normal gait.  Skin: Warm, dry without exposed rashes, lesions or ecchymosis apparent.  Neuro: Cranial nerves intact, reflexes equal bilaterally. Sensory-motor testing grossly intact. Tendon  reflexes grossly intact.  Pysch: Alert & oriented x 3.  Insight and judgement nl & appropriate. No ideations.  Assessment and Plan:  1. Labile hypertension  - Continue medication, monitor blood pressure at home.  -  Reminder to go to the ER if any CP,  SOB, nausea, dizziness, severe HA, changes vision/speech.  - CBC with Differential/Platelet - COMPLETE METABOLIC PANEL WITH GFR - Magnesium - TSH  2. Hyperlipidemia, mixed  - Continue to encourage diet/meds,& lifestyle modifications.  - Continue as allowed to monitor periodic cholesterol   3. Abnormal glucose  - Continue liberal diet, exercise  - Lifestyle modifications.   - Monitor appropriate labs. - Hemoglobin A1c  4. Severe protein-calorie malnutrition (HCC)  - COMPLETE METABOLIC PANEL WITH GFR  5. Vitamin D deficiency  -   Recommended supplementation.  - VITAMIN D 25 Hydroxy   6. B12 deficiency  - Recommended supplementation  - Vitamin B12  7. Iron deficiency  - Recommended supplementation  - Iron,Total/Total Iron Binding Cap  8. Aortic atherosclerosis ([place])   9. Fear associated with healthcare   10. Medication management  - CBC with Differential/Platelet - COMPLETE METABOLIC PANEL WITH GFR - Magnesium - TSH - Hemoglobin A1c - VITAMIN D 25 Hydroxy  - Iron,Total/Total Iron Binding Cap - Vitamin B12        Discussed  Dietitian referral to achieve higher BMI (patient declined),  exercise, BP monitoring and discussed med and SE's. Recommended labs to assess and monitor clinical status with further disposition pending results of labs.  I discussed the assessment and treatment plan with the patient and son.  They were provided an opportunity to ask questions and all were answered. They agreed with the plan and demonstrated an understanding of the instructions.  I provided over 30 minutes of exam, counseling, chart review and  complex critical decision making.         They were advised to call back or seek an in-person evaluation if the symptoms worsen or if the condition fails to improve as anticipated.   Kirtland Bouchard, MD

## 2021-01-29 LAB — CBC WITH DIFFERENTIAL/PLATELET
Absolute Monocytes: 331 cells/uL (ref 200–950)
Basophils Absolute: 18 cells/uL (ref 0–200)
Basophils Relative: 0.4 %
Eosinophils Absolute: 41 cells/uL (ref 15–500)
Eosinophils Relative: 0.9 %
HCT: 41 % (ref 35.0–45.0)
Hemoglobin: 13.1 g/dL (ref 11.7–15.5)
Lymphs Abs: 1504 cells/uL (ref 850–3900)
MCH: 29.6 pg (ref 27.0–33.0)
MCHC: 32 g/dL (ref 32.0–36.0)
MCV: 92.8 fL (ref 80.0–100.0)
MPV: 10.8 fL (ref 7.5–12.5)
Monocytes Relative: 7.2 %
Neutro Abs: 2705 cells/uL (ref 1500–7800)
Neutrophils Relative %: 58.8 %
Platelets: 282 10*3/uL (ref 140–400)
RBC: 4.42 10*6/uL (ref 3.80–5.10)
RDW: 13.3 % (ref 11.0–15.0)
Total Lymphocyte: 32.7 %
WBC: 4.6 10*3/uL (ref 3.8–10.8)

## 2021-01-29 LAB — IRON, TOTAL/TOTAL IRON BINDING CAP
%SAT: 29 % (calc) (ref 16–45)
Iron: 132 ug/dL (ref 45–160)
TIBC: 454 mcg/dL (calc) — ABNORMAL HIGH (ref 250–450)

## 2021-01-29 LAB — HEMOGLOBIN A1C
Hgb A1c MFr Bld: 5.6 % of total Hgb (ref <5.7)
Mean Plasma Glucose: 114 mg/dL
eAG (mmol/L): 6.3 mmol/L

## 2021-01-29 LAB — COMPLETE METABOLIC PANEL WITH GFR
AG Ratio: 1.7 (calc) (ref 1.0–2.5)
ALT: 31 U/L — ABNORMAL HIGH (ref 6–29)
AST: 28 U/L (ref 10–35)
Albumin: 4.5 g/dL (ref 3.6–5.1)
Alkaline phosphatase (APISO): 120 U/L (ref 37–153)
BUN/Creatinine Ratio: 51 (calc) — ABNORMAL HIGH (ref 6–22)
BUN: 30 mg/dL — ABNORMAL HIGH (ref 7–25)
CO2: 29 mmol/L (ref 20–32)
Calcium: 9.7 mg/dL (ref 8.6–10.4)
Chloride: 103 mmol/L (ref 98–110)
Creat: 0.59 mg/dL — ABNORMAL LOW (ref 0.60–0.93)
GFR, Est African American: 102 mL/min/{1.73_m2} (ref >=60)
GFR, Est Non African American: 88 mL/min/{1.73_m2} (ref >=60)
Globulin: 2.7 g/dL (calc) (ref 1.9–3.7)
Glucose, Bld: 110 mg/dL — ABNORMAL HIGH (ref 65–99)
Potassium: 3.9 mmol/L (ref 3.5–5.3)
Sodium: 142 mmol/L (ref 135–146)
Total Bilirubin: 0.6 mg/dL (ref 0.2–1.2)
Total Protein: 7.2 g/dL (ref 6.1–8.1)

## 2021-01-29 LAB — MAGNESIUM: Magnesium: 2.2 mg/dL (ref 1.5–2.5)

## 2021-01-29 LAB — VITAMIN D 25 HYDROXY (VIT D DEFICIENCY, FRACTURES): Vit D, 25-Hydroxy: 30 ng/mL (ref 30–100)

## 2021-01-29 LAB — TSH: TSH: 2.36 mIU/L (ref 0.40–4.50)

## 2021-01-29 LAB — VITAMIN B12: Vitamin B-12: 737 pg/mL (ref 200–1100)

## 2021-01-30 NOTE — Progress Notes (Signed)
============================================================ ============================================================  -    Thyroid is Normal 7 OK   - A1c is Norma - No Diabetes - Great   - Vitamin D - 30 - Extremely Low - Recc INCREASE your Vit D                                                                       from 2,000 up to 6,000 units /day  ============================================================ ============================================================  -  Iron and Vitamin B12 levels are Normal & OK  ============================================================ ============================================================  - Fortunately Blood albumin  is normal and suggests "Adequate " Nutrition  -  All Else - CBC - Kidneys - Electrolytes - Liver - Magnesium & Thyroid    - all  Normal / OK ============================================================ ============================================================

## 2021-01-31 ENCOUNTER — Encounter: Payer: Self-pay | Admitting: Internal Medicine

## 2021-03-05 ENCOUNTER — Other Ambulatory Visit: Payer: Self-pay | Admitting: Adult Health

## 2021-03-05 DIAGNOSIS — E43 Unspecified severe protein-calorie malnutrition: Secondary | ICD-10-CM

## 2021-05-11 NOTE — Progress Notes (Signed)
Complete physical exam   Assessment:   Encounter for Annual Physical Exam with abnormal findings Due annually  Health Maintenance reviewed Healthy lifestyle reviewed and goals set  Carotid stenosis, unspecified laterality - diagnosed over 20 years ago, she is declining further imaging as she would decline any surgical recommendations.  - no ASA due to bleeding issues -cholesterol at goal  Atherosclerosis of aorta (HCC) - CT 12/2017 Control blood pressure, cholesterol, glucose, increase exercise.   Hypotension due to hypovolemia  -increase fluid intake, discussed slow position changes, encourage weight gain, compression hose -cont to monitor at home -exercise as tolerated  Hyperlipidemia -not aggressively pursued secondary to severe malnutrition/underweight and age -cont diet and exercise as tolerated - defer checking lipid panel per shared decision making  Other abnormal glucose -cont diet and exercise as tolerated   Vitamin D deficiency -cont supplement -cont calcium with it   Medication management -cont biannual lab testing   Fibrocystic breast changes, bilateral -Declines mammograms, continue breast exams  Avascular necrosis of hip (HCC) - declined replacement, monitor  Moderate thoracolumbar scoliosis/ chronic back pain Resistant to meds, discussed ortho/spine referral but declines Try topical voltaren 2g QID, topical lidocaine patches (4%) BID Try to increase activity level Strongly consider moving into assisted living discussed with son [name]  Severe protein/calorie malnutrition, severely underweight/generalized weakness Reviewed high calorie/protein diet, weights improving with closely supervised meals.   Peripheral edema - Check CMP/GFR, UA, micro - denies cardiac sx, likely due to poor nutrition status and sedentary lifestyle, pain limits extremity elevation  - reports too painful for compression - will check labs, cautiously try low dose HCTZ 12.5 with  close supervision of BP by son/home aid, elevate feet as much as possible, low sodium diet and start applying compression as able. If not improving, suggested wrap on compression with sequential tightening.    Orders Placed This Encounter  Procedures   CBC with Differential/Platelet   COMPLETE METABOLIC PANEL WITH GFR   Magnesium   Microalbumin / creatinine urine ratio   Urinalysis, Routine w reflex microscopic   EKG 12-Lead    Over 30 minutes of exam, counseling, chart review, and critical decision making was performed Future Appointments  Date Time Provider [place]  08/17/2021 11:30 AM Liane Comber, NP [facility] None  05/12/2022  9:00 AM Liane Comber, NP [facility] None     Plan:   During the course of the visit the patient was educated and counseled about appropriate screening and preventive services including:   Pneumococcal vaccine  Influenza vaccine Td vaccine Prevnar 13 Screening electrocardiogram Screening mammography Bone densitometry screening Colorectal cancer screening Diabetes screening Glaucoma screening Nutrition counseling  Advanced directives: given info/requested copies    Subjective:   Shelly Barrett is a 80 y.o. female who presents for CPE. She has Carotid stenosis; Hyperlipidemia; Vitamin D deficiency; Fibrocystic breast changes, bilateral; Body mass index (BMI) less than 16.5; Protein-calorie malnutrition, severe ([place]); Aortic atherosclerosis ([place]) by CT scan on 12/14/2017; Scoliosis of thoracolumbar spine; Hypotension; Bladder infection, chronic; Avascular necrosis of hip ([place]); Severely underweight adult; B12 deficiency; and Peripheral edema on their problem list.   She is accompanied by her son [name]. Concerned about moderate/severe bil ankle edema x 1-2 months.   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    Patient has a Therapist, sports with her from 9a to 5p daily.  She has chronic LBP  from from DJD/DDD and moderately severe thoracolumbar scoliosis though she has been very resistant to taking any medications to manage pain. She endorses nausea/emesis with tylenol. She had an EDSI for L4/L5 HNP in 2002.  She has regular appt's with a Chiropractor  for "adjustments".  Ambulates with walker in home. We have discussed need for transitioned to higher level of care for him but she has been very resistant to this.   Patient also has long hx/o chronic malnutrition  (5'2" /75# - BMI 13.79 in 06/12/2020) from poor intake attributed to obsessive eating patterns and "alleged" food allergies. She has has hx/o low Vit B12, low serum iron, Nl CBC and hx/o Lactose Intolerance. She was prescribed  Remeron for appetite which she stopped stopped claiming it caused diarrhea. Weights are fortunately improving with close monitored meals by son and caregiver in home. She has intermittent constipation, uses stool softener/sennakot as needed.   BMI is Body mass index is 16.36 kg/m., she has been working on diet, working to gain weight. She has been eating ice cream. Son brings breakfast daily. Home care worker helps with meal during the day.  Wt Readings from Last 3 Encounters:  05/12/21 86 lb 9.6 oz (39.3 kg)  01/28/21 83 lb 12.8 oz (38 kg)  10/07/20 81 lb 9.6 oz (37 kg)   Her blood pressure has been controlled at home, today their BP is BP: 124/68 She does not workout. She denies chest pain, shortness of breath. She does endorse dizziness with lying to standing.   CT scan on 12/14/2017 showed Aortic Atherosclerosis.   She is not on cholesterol medication and denies myalgias. Her cholesterol is not at goal but not pursued in light of age and severe malnutrition. The cholesterol last visit was:   Lab Results  Component Value Date   CHOL 263 (H) 05/12/2020   HDL 110 05/12/2020   LDLCALC 133 (H) 05/12/2020   TRIG 92  05/12/2020   CHOLHDL 2.4 05/12/2020   She has been working on diet and exercise for glucose management.  No evidence of polydipsia, polyphagia, or polyuria.   Lab Results  Component Value Date   HGBA1C 5.6 01/28/2021   Last GFR Lab Results  Component Value Date   GFRNONAA 88 01/28/2021   Patient is on Vitamin D supplement, taking gummies, takes 6000 IU when son reminds, working on increasing compliance.  Lab Results  Component Value Date   VD25OH 30 01/28/2021       Medication Review Current Outpatient Medications on File Prior to Visit  Medication Sig Dispense Refill   amitriptyline (ELAVIL) 10 MG tablet TAKE 1 TABLET BY MOUTH THREE TIMES DAILY WITH MEALS 270 tablet 1   CALCIUM-VITAMIN D PO Take 1 tablet by mouth daily.     cholecalciferol (VITAMIN D) 1000 UNITS tablet Take 2,000 Units by mouth daily.      No current facility-administered medications on file prior to visit.    Current Problems (verified) Patient Active Problem List   Diagnosis Date Noted   Peripheral edema 05/12/2021   B12 deficiency 05/13/2020   Severely underweight adult 05/12/2020   Avascular necrosis of hip ([place]) 05/08/2020   Bladder infection, chronic 09/28/2019   Hypotension 07/03/2019   Scoliosis of thoracolumbar spine 01/19/2018   Aortic atherosclerosis ([place]) by CT scan on 12/14/2017 12/14/2017   Protein-calorie malnutrition, severe ([place]) 06/23/2017   Body mass index (BMI) less than 16.5 12/31/2016   Fibrocystic breast changes, bilateral 08/28/2015   Hyperlipidemia    Vitamin D  deficiency    Carotid stenosis 05/28/2010    Screening Tests Immunization History  Administered Date(s) Administered   Influenza, High Dose Seasonal PF 07/30/2014, 06/25/2015, 07/01/2016, 07/12/2017, 07/05/2018, 06/21/2019   Influenza-Unspecified 06/20/2013   PFIZER(Purple Top)SARS-COV-2 Vaccination 10/17/2020   Pneumococcal Conjugate-13 09/18/2014   Pneumococcal-Unspecified 09/16/2009   Tdap 11/17/2010    Zoster, Live 09/16/2009    Preventative care:  Last colonoscopy: 10/2016, 1small polyp, mod sigmoid diverticulum, DONE per Dr. Carlean Purl Last mammogram: 09/2015 declines due to pain, does self breast exams Last pap smear/pelvic exam: remote   DEXA: 12/2014 declines another, pain with exam, would decline alendronate, taking calcium/vitamin D supplement   DOESN'T WANT IMAGING AS WOULD NOT PURSUE TREATMENT  Prior vaccinations: TD or Tdap: 2012  Influenza 2020 Pneumococcal: 2010 Prevnar13: 2015  Shingles/Zostavax: 2010 Covid 19: 2/2, 2021, moderna + booster   Names of Other Physician/Practitioners you currently use: 1.  [facility]- here for primary care 2. Dr. Delman Cheadle, eye doctor, last visit 2021, goes annually, wears glasses, monitoring cataracts - patient doesn't want  3. Dr. Mariea Clonts, dentist, last visit 2019, goes q39m overdue due to covid 19  Patient Care Team: MUnk Pinto MD as PCP - General (Internal Medicine) GGatha Mayer MD as Consulting Physician (Gastroenterology)  Allergies Allergies  Allergen Reactions   Betadine [Povidone Iodine]    Ciprocinonide [Fluocinolone]    Ciprofloxacin Hcl     Hives   Flexeril [Cyclobenzaprine]    Ibuprofen    Lactose Intolerance (Gi)     Diarrhea, cramping   Macrobid [Nitrofurantoin Macrocrystal] Diarrhea, Nausea And Vomiting and Swelling   Naprosyn [Naproxen]    Penicillins     [place]: [place] then on keflex without known adverse reaction   Shellfish Allergy    Sodium Benzoate [Nutritional Supplements]    Iodinated Diagnostic Agents Rash    SURGICAL HISTORY She  has a past surgical history that includes Breast surgery (1980, 2012); Tubal ligation; Tonsillectomy and adenoidectomy; and Colonoscopy. FAMILY HISTORY Her family history includes Arthritis in her sister; Cancer in her father; Heart disease in her father and mother;  Hyperlipidemia in her brother; Hypertension in her mother. SOCIAL HISTORY She  reports that she has never smoked. She has never used smokeless tobacco. She reports that she does not drink alcohol and does not use drugs.    Review of Systems  Constitutional:  Negative for malaise/fatigue and weight loss.  HENT:  Negative for hearing loss and tinnitus.   Eyes:  Negative for blurred vision and double vision.  Respiratory:  Negative for cough, sputum production, shortness of breath and wheezing.   Cardiovascular:  Positive for leg swelling (bil ankles, 1-2 months). Negative for chest pain, palpitations, orthopnea, claudication and PND.  Gastrointestinal:  Positive for constipation (intermittent, manages with meds). Negative for abdominal pain, blood in stool, diarrhea, heartburn, melena, nausea and vomiting.  Genitourinary: Negative.   Musculoskeletal:  Positive for back pain. Negative for falls, joint pain and myalgias.  Skin:  Negative for rash.  Neurological:  Negative for dizziness, tingling, sensory change, weakness and headaches.  Endo/Heme/Allergies:  Negative for polydipsia.  Psychiatric/Behavioral: Negative.  Negative for depression, memory loss, substance abuse and suicidal ideas. The patient is not nervous/anxious and does not have insomnia.   All other systems reviewed and are negative.  Objective:   Today's Vitals   05/12/21 0901  BP: 124/68  Pulse: 94  Temp: (!) 97.5 F (36.4 C)  SpO2:  98%  Weight: 86 lb 9.6 oz (39.3 kg)  Height: '5\' 1"'$  (1.549 m)    Body mass index is 16.36 kg/m.  General appearance: alert, no distress, extremely thin/frail WD/WN,  female HEENT: normocephalic, sclerae anicteric, TMs pearly, nares patent, no discharge or erythema, pharynx normal Oral cavity: MMM, no lesions Neck: supple, no lymphadenopathy, no thyromegaly, no masses Heart: RRR, normal S1, S2, no murmurs Lungs: CTA bilaterally, no wheezes, rhonchi, or rales Expansion significantly  limited by scoliosis.  Abdomen: +bs, mildly firm throughout, non tender, non distended, no masses, no hepatomegaly, no splenomegaly Musculoskeletal: nontender, she has moderate/severe kyphoscoliosis.  Extremities: 2+ pitting edema bil ankles and pedal; no cyanosis, no clubbing Pulses: 2+ symmetric, upper and lower extremities, normal cap refill Neurological: alert, oriented x 3, CN2-12 intact, strength 3/5 throughout, upper extremities and lower extremities, sensation normal throughout, very unsteady/weak, requires assistance for sitting to standing and ambulation Psychiatric: normal affect, behavior normal, pleasant  Breast: Breasts: breasts appear normal, no suspicious masses, no skin or nipple changes or axillary nodes, bilateral implants, no palpable abnormalities otherwise. Skin: very dry, thin, flaking; no concerning rash, lesions, ecchymoses Gyn: declines Rectal: defer  EKG: NSR, IRBBB  The patient's weight, height, BMI, and visual acuity have been recorded in the chart.  I have made referrals, counseling, and provided education to the patient based on review of the above and I have provided the patient with a written personalized care plan for preventive services.     Izora Ribas, NP   05/12/2021

## 2021-05-12 ENCOUNTER — Encounter: Payer: Self-pay | Admitting: Adult Health

## 2021-05-12 ENCOUNTER — Other Ambulatory Visit: Payer: Self-pay

## 2021-05-12 ENCOUNTER — Ambulatory Visit (INDEPENDENT_AMBULATORY_CARE_PROVIDER_SITE_OTHER): Payer: PPO | Admitting: Adult Health

## 2021-05-12 VITALS — BP 124/68 | HR 94 | Temp 97.5°F | Ht 61.0 in | Wt 86.6 lb

## 2021-05-12 DIAGNOSIS — N3020 Other chronic cystitis without hematuria: Secondary | ICD-10-CM

## 2021-05-12 DIAGNOSIS — Z79899 Other long term (current) drug therapy: Secondary | ICD-10-CM | POA: Diagnosis not present

## 2021-05-12 DIAGNOSIS — Z136 Encounter for screening for cardiovascular disorders: Secondary | ICD-10-CM

## 2021-05-12 DIAGNOSIS — N6011 Diffuse cystic mastopathy of right breast: Secondary | ICD-10-CM

## 2021-05-12 DIAGNOSIS — I959 Hypotension, unspecified: Secondary | ICD-10-CM | POA: Diagnosis not present

## 2021-05-12 DIAGNOSIS — I700 Atherosclerosis of aorta: Secondary | ICD-10-CM

## 2021-05-12 DIAGNOSIS — Z0000 Encounter for general adult medical examination without abnormal findings: Secondary | ICD-10-CM | POA: Diagnosis not present

## 2021-05-12 DIAGNOSIS — E43 Unspecified severe protein-calorie malnutrition: Secondary | ICD-10-CM

## 2021-05-12 DIAGNOSIS — Z0001 Encounter for general adult medical examination with abnormal findings: Secondary | ICD-10-CM

## 2021-05-12 DIAGNOSIS — R609 Edema, unspecified: Secondary | ICD-10-CM | POA: Diagnosis not present

## 2021-05-12 DIAGNOSIS — R636 Underweight: Secondary | ICD-10-CM

## 2021-05-12 DIAGNOSIS — M419 Scoliosis, unspecified: Secondary | ICD-10-CM

## 2021-05-12 DIAGNOSIS — Z1389 Encounter for screening for other disorder: Secondary | ICD-10-CM | POA: Diagnosis not present

## 2021-05-12 DIAGNOSIS — M87059 Idiopathic aseptic necrosis of unspecified femur: Secondary | ICD-10-CM

## 2021-05-12 DIAGNOSIS — R600 Localized edema: Secondary | ICD-10-CM

## 2021-05-12 DIAGNOSIS — E785 Hyperlipidemia, unspecified: Secondary | ICD-10-CM

## 2021-05-12 DIAGNOSIS — E559 Vitamin D deficiency, unspecified: Secondary | ICD-10-CM

## 2021-05-12 DIAGNOSIS — Z681 Body mass index (BMI) 19 or less, adult: Secondary | ICD-10-CM

## 2021-05-12 DIAGNOSIS — E538 Deficiency of other specified B group vitamins: Secondary | ICD-10-CM

## 2021-05-12 MED ORDER — LIDOCAINE 4 % EX PTCH: MEDICATED_PATCH | CUTANEOUS | Status: DC

## 2021-05-12 MED ORDER — HYDROCHLOROTHIAZIDE 12.5 MG PO TABS: ORAL_TABLET | ORAL | 3 refills | Status: DC

## 2021-05-12 MED ORDER — DICLOFENAC SODIUM 1 % EX GEL: 2.0000 g | Freq: Four times a day (QID) | CUTANEOUS | 1 refills | Status: DC

## 2021-05-12 MED ORDER — GABAPENTIN 100 MG PO CAPS: 100.0000 mg | ORAL_CAPSULE | Freq: Three times a day (TID) | ORAL | 0 refills | Status: DC

## 2021-05-12 NOTE — Patient Instructions (Addendum)
  Please send covid 19 original shot information   Check with insurance about shingrix vaccine - [facility] or [facility]    Zoster Vaccine, Recombinant injection What is this medication? ZOSTER VACCINE (ZOS ter vak SEEN) is a vaccine used to reduce the risk of getting shingles. This vaccine is not used to treat shingles or nerve pain fromshingles. This medicine may be used for other purposes; ask your health care provider orpharmacist if you have questions. COMMON BRAND NAME(S): [facility] What should I tell my care team before I take this medication? They need to know if you have any of these conditions: cancer immune system problems an unusual or allergic reaction to Zoster vaccine, other medications, foods, dyes, or preservatives pregnant or trying to get pregnant breast-feeding How should I use this medication? This vaccine is injected into a muscle. It is given by a health care provider. A copy of Vaccine Information Statements will be given before each vaccination. Be sure to read this information carefully each time. This sheet may changeoften. Talk to your health care provider about the use of this vaccine in children.This vaccine is not approved for use in children. Overdosage: If you think you have taken too much of this medicine contact apoison control center or emergency room at once. NOTE: This medicine is only for you. Do not share this medicine with others. What if I miss a dose? Keep appointments for follow-up (booster) doses. It is important not to miss your dose. Call your health care provider if you are unable to keep anappointment. What may interact with this medication? medicines that suppress your immune system medicines to treat cancer steroid medicines like prednisone or cortisone This list may not describe all possible interactions. Give your health care provider a list of all the medicines, herbs, non-prescription drugs, or dietary supplements you use. Also tell them if  you smoke, drink alcohol, or use illegaldrugs. Some items may interact with your medicine. What should I watch for while using this medication? Visit your health care provider regularly. This vaccine, like all vaccines, may not fully protect everyone. What side effects may I notice from receiving this medication? Side effects that you should report to your doctor or health care professionalas soon as possible: allergic reactions (skin rash, itching or hives; swelling of the face, lips, or tongue) trouble breathing Side effects that usually do not require medical attention (report these toyour doctor or health care professional if they continue or are bothersome): chills headache fever nausea pain, redness, or irritation at site where injected tiredness vomiting This list may not describe all possible side effects. Call your doctor for medical advice about side effects. You may report side effects to FDA at1-800-FDA-1088. Where should I keep my medication? This vaccine is only given by a health care provider. It will not be stored athome. NOTE: This sheet is a summary. It may not cover all possible information. If you have questions about this medicine, talk to your doctor, pharmacist, orhealth care provider.  2022 Elsevier/Gold Standard (2019-11-02 16:23:07)

## 2021-05-13 ENCOUNTER — Other Ambulatory Visit: Payer: Self-pay | Admitting: Adult Health

## 2021-05-13 DIAGNOSIS — E43 Unspecified severe protein-calorie malnutrition: Secondary | ICD-10-CM | POA: Diagnosis not present

## 2021-05-13 DIAGNOSIS — Z1389 Encounter for screening for other disorder: Secondary | ICD-10-CM | POA: Diagnosis not present

## 2021-05-13 DIAGNOSIS — I959 Hypotension, unspecified: Secondary | ICD-10-CM | POA: Diagnosis not present

## 2021-05-13 DIAGNOSIS — R636 Underweight: Secondary | ICD-10-CM | POA: Diagnosis not present

## 2021-05-13 DIAGNOSIS — Z79899 Other long term (current) drug therapy: Secondary | ICD-10-CM | POA: Diagnosis not present

## 2021-05-13 DIAGNOSIS — Z1211 Encounter for screening for malignant neoplasm of colon: Secondary | ICD-10-CM

## 2021-05-13 LAB — CBC WITH DIFFERENTIAL/PLATELET
Absolute Monocytes: 397 cells/uL (ref 200–950)
Basophils Absolute: 20 cells/uL (ref 0–200)
Basophils Relative: 0.4 %
Eosinophils Absolute: 49 cells/uL (ref 15–500)
Eosinophils Relative: 1 %
HCT: 37.7 % (ref 35.0–45.0)
Hemoglobin: 12 g/dL (ref 11.7–15.5)
Lymphs Abs: 887 cells/uL (ref 850–3900)
MCH: 30 pg (ref 27.0–33.0)
MCHC: 31.8 g/dL — ABNORMAL LOW (ref 32.0–36.0)
MCV: 94.3 fL (ref 80.0–100.0)
MPV: 10.5 fL (ref 7.5–12.5)
Monocytes Relative: 8.1 %
Neutro Abs: 3548 cells/uL (ref 1500–7800)
Neutrophils Relative %: 72.4 %
Platelets: 284 10*3/uL (ref 140–400)
RBC: 4 10*6/uL (ref 3.80–5.10)
RDW: 13.1 % (ref 11.0–15.0)
Total Lymphocyte: 18.1 %
WBC: 4.9 10*3/uL (ref 3.8–10.8)

## 2021-05-13 LAB — COMPLETE METABOLIC PANEL WITH GFR
AG Ratio: 1.7 (calc) (ref 1.0–2.5)
ALT: 16 U/L (ref 6–29)
AST: 22 U/L (ref 10–35)
Albumin: 4.2 g/dL (ref 3.6–5.1)
Alkaline phosphatase (APISO): 139 U/L (ref 37–153)
BUN/Creatinine Ratio: 51 (calc) — ABNORMAL HIGH (ref 6–22)
BUN: 30 mg/dL — ABNORMAL HIGH (ref 7–25)
CO2: 30 mmol/L (ref 20–32)
Calcium: 9.3 mg/dL (ref 8.6–10.4)
Chloride: 104 mmol/L (ref 98–110)
Creat: 0.59 mg/dL — ABNORMAL LOW (ref 0.60–1.00)
Globulin: 2.5 g/dL (calc) (ref 1.9–3.7)
Glucose, Bld: 93 mg/dL (ref 65–99)
Potassium: 3.6 mmol/L (ref 3.5–5.3)
Sodium: 143 mmol/L (ref 135–146)
Total Bilirubin: 0.5 mg/dL (ref 0.2–1.2)
Total Protein: 6.7 g/dL (ref 6.1–8.1)
eGFR: 92 mL/min/{1.73_m2} (ref >=60)

## 2021-05-13 LAB — MAGNESIUM: Magnesium: 2.2 mg/dL (ref 1.5–2.5)

## 2021-05-14 LAB — URINALYSIS, ROUTINE W REFLEX MICROSCOPIC
Bilirubin Urine: NEGATIVE
Glucose, UA: NEGATIVE
Ketones, ur: NEGATIVE
Nitrite: NEGATIVE
RBC / HPF: NONE SEEN /HPF (ref 0–2)
Specific Gravity, Urine: 1.022 (ref 1.001–1.035)
pH: 5.5 (ref 5.0–8.0)

## 2021-05-14 LAB — MICROALBUMIN / CREATININE URINE RATIO
Creatinine, Urine: 89 mg/dL (ref 20–275)
Microalb Creat Ratio: 143 mcg/mg creat — ABNORMAL HIGH (ref <30)
Microalb, Ur: 12.7 mg/dL

## 2021-05-14 LAB — MICROSCOPIC MESSAGE

## 2021-06-23 ENCOUNTER — Other Ambulatory Visit: Payer: Self-pay | Admitting: Adult Health

## 2021-07-02 ENCOUNTER — Other Ambulatory Visit: Payer: Self-pay | Admitting: Nurse Practitioner

## 2021-08-02 ENCOUNTER — Other Ambulatory Visit: Payer: Self-pay

## 2021-08-02 ENCOUNTER — Emergency Department (HOSPITAL_COMMUNITY): Payer: PPO

## 2021-08-02 ENCOUNTER — Encounter (HOSPITAL_COMMUNITY): Payer: Self-pay

## 2021-08-02 ENCOUNTER — Emergency Department (HOSPITAL_COMMUNITY)
Admission: EM | Admit: 2021-08-02 | Discharge: 2021-08-02 | Disposition: A | Discharge status: Home / Self Care | Payer: PPO | Attending: Emergency Medicine | Admitting: Emergency Medicine

## 2021-08-02 DIAGNOSIS — R410 Disorientation, unspecified: Secondary | ICD-10-CM | POA: Diagnosis not present

## 2021-08-02 DIAGNOSIS — R4182 Altered mental status, unspecified: Secondary | ICD-10-CM | POA: Insufficient documentation

## 2021-08-02 DIAGNOSIS — R0609 Other forms of dyspnea: Secondary | ICD-10-CM | POA: Diagnosis not present

## 2021-08-02 DIAGNOSIS — R531 Weakness: Secondary | ICD-10-CM

## 2021-08-02 DIAGNOSIS — R059 Cough, unspecified: Secondary | ICD-10-CM | POA: Diagnosis not present

## 2021-08-02 DIAGNOSIS — R0689 Other abnormalities of breathing: Secondary | ICD-10-CM | POA: Diagnosis not present

## 2021-08-02 DIAGNOSIS — E876 Hypokalemia: Secondary | ICD-10-CM

## 2021-08-02 DIAGNOSIS — R0902 Hypoxemia: Secondary | ICD-10-CM | POA: Diagnosis not present

## 2021-08-02 DIAGNOSIS — Z20822 Contact with and (suspected) exposure to covid-19: Secondary | ICD-10-CM | POA: Insufficient documentation

## 2021-08-02 DIAGNOSIS — R0602 Shortness of breath: Secondary | ICD-10-CM | POA: Diagnosis not present

## 2021-08-02 LAB — COMPREHENSIVE METABOLIC PANEL
ALT: 25 U/L (ref 0–44)
AST: 31 U/L (ref 15–41)
Albumin: 3.7 g/dL (ref 3.5–5.0)
Alkaline Phosphatase: 157 U/L — ABNORMAL HIGH (ref 38–126)
Anion gap: 9 (ref 5–15)
BUN: 36 mg/dL — ABNORMAL HIGH (ref 8–23)
CO2: 28 mmol/L (ref 22–32)
Calcium: 9 mg/dL (ref 8.9–10.3)
Chloride: 102 mmol/L (ref 98–111)
Creatinine, Ser: 0.47 mg/dL (ref 0.44–1.00)
GFR, Estimated: >60 mL/min (ref >60)
Glucose, Bld: 98 mg/dL (ref 70–99)
Potassium: 3.4 mmol/L — ABNORMAL LOW (ref 3.5–5.1)
Sodium: 139 mmol/L (ref 135–145)
Total Bilirubin: 0.5 mg/dL (ref 0.3–1.2)
Total Protein: 7 g/dL (ref 6.5–8.1)

## 2021-08-02 LAB — CBC
HCT: 36.7 % (ref 36.0–46.0)
Hemoglobin: 12.2 g/dL (ref 12.0–15.0)
MCH: 29.9 pg (ref 26.0–34.0)
MCHC: 33.2 g/dL (ref 30.0–36.0)
MCV: 90 fL (ref 80.0–100.0)
Platelets: 294 10*3/uL (ref 150–400)
RBC: 4.08 MIL/uL (ref 3.87–5.11)
RDW: 15.5 % (ref 11.5–15.5)
WBC: 5.4 10*3/uL (ref 4.0–10.5)
nRBC: 0 % (ref 0.0–0.2)

## 2021-08-02 LAB — RESP PANEL BY RT-PCR (FLU A&B, COVID) ARPGX2
Influenza A by PCR: NEGATIVE
Influenza B by PCR: NEGATIVE
SARS Coronavirus 2 by RT PCR: NEGATIVE

## 2021-08-02 LAB — URINALYSIS, ROUTINE W REFLEX MICROSCOPIC
Bacteria, UA: NONE SEEN
Bilirubin Urine: NEGATIVE
Glucose, UA: NEGATIVE mg/dL
Ketones, ur: 5 mg/dL — AB
Leukocytes,Ua: NEGATIVE
Nitrite: NEGATIVE
Protein, ur: NEGATIVE mg/dL
Specific Gravity, Urine: 1.021 (ref 1.005–1.030)
pH: 5 (ref 5.0–8.0)

## 2021-08-02 LAB — TROPONIN I (HIGH SENSITIVITY): Troponin I (High Sensitivity): 4 ng/L (ref <18)

## 2021-08-02 LAB — BRAIN NATRIURETIC PEPTIDE: B Natriuretic Peptide: 32.9 pg/mL (ref 0.0–100.0)

## 2021-08-02 MED ORDER — POTASSIUM CHLORIDE 20 MEQ PO PACK: 20.0000 meq | PACK | Freq: Once | ORAL | Status: DC

## 2021-08-02 NOTE — Discharge Instructions (Addendum)
It was our pleasure to provide your ER care today - we hope that you feel better.  Make sure to drink plenty of fluids and stay adequately hydrated. Consider supplementing nutrition with Boost, Ensure, or other nutritious shake.   Overall, your lab tests and chest xray look good. We currently do not find a urine infection, or pneumonia. Your covid/flu tests are negative. Your potassium level is very slightly low (3.4) - eat plenty of fruits and vegetables.   Follow up with primary care doctor in the next 1-2 weeks if symptoms fail to improve/resolve.  Return to ER if worse, new symptoms, fevers, new/severe pain, chest pain, increased trouble breathing, or other concern.

## 2021-08-02 NOTE — ED Triage Notes (Signed)
"  Increased confusion and shortness of breath over the last week per family they think she has a UTI" per EMS

## 2021-08-02 NOTE — ED Provider Notes (Signed)
[facility] DEPT Provider Note   CSN: [id number] Arrival date & time: 08/02/21  1147     History Chief Complaint  Patient presents with   Altered Mental Status    Shelly Barrett is a 80 y.o. female.  Patient with c/o generalized weakness, increased dyspnea w exertion, and periods confusion in past week. Symptoms acute onset, mild-mod, persistent. Family member states seems improved since on o2 in ED. Occasional non prod cough. No sore throat. No fever/chills. At baseline walks very slowly w walker. Denies headache. No trauma or fall. No chest pain or discomfort. No abd pain or nvd. Poor po intake at baseline, ?less in past week. No vomiting or diarrhea. No dysuria or gu c/o, although hx uti w similar symptoms. No change in meds.   The history is provided by the patient, medical records and a relative.  Altered Mental Status Presenting symptoms: confusion   Associated symptoms: no abdominal pain, no fever, no headaches, no rash and no vomiting       Past Medical History:  Diagnosis Date   Arthritis    E coli bacteremia 07/03/2019   Hyperlipidemia    Ileus ([place]) 07/03/2019   Left femoral hernia without obstruction or gangrene 12/14/2017   On CT 12/13/2017   Prediabetes    Scoliosis    Sepsis ([place]) 07/02/2019   Sigmoid diverticulitis    Vitamin D deficiency     Patient Active Problem List   Diagnosis Date Noted   Peripheral edema 05/12/2021   B12 deficiency 05/13/2020   Severely underweight adult 05/12/2020   Avascular necrosis of hip ([place]) 05/08/2020   Bladder infection, chronic 09/28/2019   Hypotension 07/03/2019   Scoliosis of thoracolumbar spine 01/19/2018   Aortic atherosclerosis ([place]) by CT scan on 12/14/2017 12/14/2017   Protein-calorie malnutrition, severe ([place]) 06/23/2017   Body mass index (BMI) less than 16.5 12/31/2016   Fibrocystic breast changes, bilateral 08/28/2015   Hyperlipidemia    Vitamin D deficiency    Carotid stenosis  05/28/2010    Past Surgical History:  Procedure Laterality Date   [place], 2012   implants   COLONOSCOPY     TONSILLECTOMY AND ADENOIDECTOMY     TUBAL LIGATION       OB History   No obstetric history on file.     Family History  Problem Relation Age of Onset   Heart disease Mother    Hypertension Mother    Heart disease Father    Cancer Father        throat and prostate   Arthritis Sister        RA   Hyperlipidemia Brother    Colon cancer Neg Hx    Stomach cancer Neg Hx    Rectal cancer Neg Hx    Esophageal cancer Neg Hx    Alcohol abuse Neg Hx    Liver cancer Neg Hx     Social History   Tobacco Use   Smoking status: Never   Smokeless tobacco: Never  Vaping Use   Vaping Use: Never used  Substance Use Topics   Alcohol use: No   Drug use: No    Home Medications Prior to Admission medications   Medication Sig Start Date End Date Taking? Authorizing Provider  amitriptyline (ELAVIL) 10 MG tablet TAKE 1 TABLET BY MOUTH THREE TIMES DAILY WITH MEALS 03/05/21   Liane Comber, NP  CALCIUM-VITAMIN D PO Take 1 tablet by mouth daily.    [provider]  cholecalciferol (VITAMIN D) 1000 UNITS tablet Take 2,000 Units by mouth daily.     [provider]  diclofenac Sodium (VOLTAREN) 1 % GEL Apply 2 g topically 4 (four) times daily. For joint pain. 05/12/21   Liane Comber, NP  gabapentin (NEURONTIN) 100 MG capsule TAKE 1 TO 3 CAPSULES(100 TO 300 MG) BY MOUTH THREE TIMES DAILY 06/23/21   Magda Bernheim, NP  hydrochlorothiazide (HYDRODIURIL) 12.5 MG tablet Take 1-2 tab per day as needed for swelling. 05/12/21   Liane Comber, NP  Lidocaine (SALONPAS PAIN RELIEVING) 4 % PTCH Apply 1 patch every 12 hours as needed for pain. 05/12/21   Liane Comber, NP    Allergies    Betadine [povidone iodine], Ciprocinonide [fluocinolone], Ciprofloxacin hcl, Flexeril [cyclobenzaprine], Ibuprofen, Lactose intolerance (gi), Macrobid [nitrofurantoin macrocrystal],  Naprosyn [naproxen], Penicillins, Shellfish allergy, Sodium benzoate [nutritional supplements], and Iodinated diagnostic agents  Review of Systems   Review of Systems  Constitutional:  Negative for chills and fever.  HENT:  Negative for sore throat.   Eyes:  Negative for redness.  Respiratory:  Positive for cough and shortness of breath.   Cardiovascular:  Negative for chest pain and leg swelling.  Gastrointestinal:  Negative for abdominal pain, diarrhea and vomiting.  Genitourinary:  Negative for dysuria and flank pain.  Musculoskeletal:  Negative for back pain and neck pain.  Skin:  Negative for rash.  Neurological:  Negative for headaches.  Hematological:  Does not bruise/bleed easily.  Psychiatric/Behavioral:  Positive for confusion.    Physical Exam Updated Vital Signs BP 129/64 (BP Location: Right Arm)   Pulse 86   Temp 97.8 F (36.6 C) (Oral)   Resp (!) 24   Ht 1.549 m (5\' 1" )   Wt 38.6 kg   SpO2 100%   BMI 16.06 kg/m   Physical Exam Vitals and nursing note reviewed.  Constitutional:      Appearance: Normal appearance. She is well-developed.  HENT:     Head: Atraumatic.     Nose: Nose normal.     Mouth/Throat:     Mouth: Mucous membranes are moist.  Eyes:     General: No scleral icterus.    Conjunctiva/sclera: Conjunctivae normal.     Pupils: Pupils are equal, round, and reactive to light.  Neck:     Trachea: No tracheal deviation.     Comments: No stiffness or rigidity.  Cardiovascular:     Rate and Rhythm: Normal rate and regular rhythm.     Pulses: Normal pulses.     Heart sounds: Normal heart sounds. No murmur heard.   No friction rub. No gallop.  Pulmonary:     Effort: Pulmonary effort is normal. No respiratory distress.     Breath sounds: Normal breath sounds.  Abdominal:     General: Bowel sounds are normal. There is no distension.     Palpations: Abdomen is soft.     Tenderness: There is no abdominal tenderness. There is no guarding.   Genitourinary:    Comments: No cva tenderness.  Musculoskeletal:        General: No swelling or tenderness.     Cervical back: Normal range of motion and neck supple. No rigidity. No muscular tenderness.     Right lower leg: No edema.     Left lower leg: No edema.  Skin:    General: Skin is warm and dry.     Findings: No rash.  Neurological:     Mental Status: She is alert.  Comments: Alert, speech normal. No dysarthria or aphasia. Motor intact bil, stre 5/5. No pronator drift. Sens grossly intact.   Psychiatric:        Mood and Affect: Mood normal.    ED Results / Procedures / Treatments   Labs (all labs ordered are listed, but only abnormal results are displayed) Results for orders placed or performed during the hospital encounter of 08/02/21  CBC  Result Value Ref Range   WBC 5.4 4.0 - 10.5 K/uL   RBC 4.08 3.87 - 5.11 MIL/uL   Hemoglobin 12.2 12.0 - 15.0 g/dL   HCT 36.7 36.0 - 46.0 %   MCV 90.0 80.0 - 100.0 fL   MCH 29.9 26.0 - 34.0 pg   MCHC 33.2 30.0 - 36.0 g/dL   RDW 15.5 11.5 - 15.5 %   Platelets 294 150 - 400 K/uL   nRBC 0.0 0.0 - 0.2 %  Comprehensive metabolic panel  Result Value Ref Range   Sodium 139 135 - 145 mmol/L   Potassium 3.4 (L) 3.5 - 5.1 mmol/L   Chloride 102 98 - 111 mmol/L   CO2 28 22 - 32 mmol/L   Glucose, Bld 98 70 - 99 mg/dL   BUN 36 (H) 8 - 23 mg/dL   Creatinine, Ser 0.47 0.44 - 1.00 mg/dL   Calcium 9.0 8.9 - 10.3 mg/dL   Total Protein 7.0 6.5 - 8.1 g/dL   Albumin 3.7 3.5 - 5.0 g/dL   AST 31 15 - 41 U/L   ALT 25 0 - 44 U/L   Alkaline Phosphatase 157 (H) 38 - 126 U/L   Total Bilirubin 0.5 0.3 - 1.2 mg/dL   GFR, Estimated >60 >60 mL/min   Anion gap 9 5 - 15  Urinalysis, Routine w reflex microscopic Urine, Clean Catch  Result Value Ref Range   Color, Urine YELLOW YELLOW   APPearance CLEAR CLEAR   Specific Gravity, Urine 1.021 1.005 - 1.030   pH 5.0 5.0 - 8.0   Glucose, UA NEGATIVE NEGATIVE mg/dL   Hgb urine dipstick SMALL (A)  NEGATIVE   Bilirubin Urine NEGATIVE NEGATIVE   Ketones, ur 5 (A) NEGATIVE mg/dL   Protein, ur NEGATIVE NEGATIVE mg/dL   Nitrite NEGATIVE NEGATIVE   Leukocytes,Ua NEGATIVE NEGATIVE   RBC / HPF 0-5 0 - 5 RBC/hpf   WBC, UA 0-5 0 - 5 WBC/hpf   Bacteria, UA NONE SEEN NONE SEEN   Squamous Epithelial / LPF 0-5 0 - 5  Brain natriuretic peptide  Result Value Ref Range   B Natriuretic Peptide 32.9 0.0 - 100.0 pg/mL  Troponin I (High Sensitivity)  Result Value Ref Range   Troponin I (High Sensitivity) 4 <18 ng/L     EKG EKG Interpretation  Date/Time:  Sunday August 02 2021 12:13:08 EDT Ventricular Rate:  88 PR Interval:  153 QRS Duration: 81 QT Interval:  388 QTC Calculation: 470 R Axis:   91 Text Interpretation: Sinus rhythm No significant change since last tracing Confirmed by Lajean Saver [phone]) on 08/02/2021 2:23:35 PM  Radiology DG Chest Port 1 View  Result Date: 08/02/2021 CLINICAL DATA:  Shortness of breath.  Confusion. EXAM: PORTABLE CHEST 1 VIEW COMPARISON:  07/02/2019 FINDINGS: Lungs are hyperinflated. Thoracic kyphosis. Stable appearance of thoracic scoliosis. Heart size is normal. Lungs are clear. IMPRESSION: No evidence for acute cardiopulmonary abnormality. Electronically Signed   By: Nolon Nations M.D.   On: 08/02/2021 13:05    Procedures Procedures   Medications Ordered in ED Medications -  No data to display  ED Course  I have reviewed the triage vital signs and the nursing notes.  Pertinent labs & imaging results that were available during my care of the patient were reviewed by me and considered in my medical decision making (see chart for details).    MDM Rules/Calculators/A&P                           Labs sent. O2 [place].   Reviewed nursing notes and prior charts for additional history.   Labs reviewed/interpreted by me - chem normal except k v slightly low. Ua neg for uti.  Trop and bnp normal.   CXR reviewed/interpreted by me - no pna.   Po  fluids/food.   Recheck, no increased wob, pulse ox 100%. Vitals normal.   Pt currently appears stable for d/c.   Rec pcp f/u.    Final Clinical Impression(s) / ED Diagnoses Final diagnoses:  None    Rx / DC Orders ED Discharge Orders     None        Lajean Saver, MD 08/02/21 1425

## 2021-08-14 NOTE — Progress Notes (Deleted)
AWV and follow up  Assessment:   Annual Medicare Wellness Visit Due annually  Health maintenance reviewed ***  Carotid stenosis, unspecified laterality - diagnosed over 20 years ago, she is declining further imaging as she would decline any surgical recommendations.  - no ASA due to bleeding issues -cholesterol at goal, malnourished, no statin   Atherosclerosis of aorta (HCC) - CT 12/2017 Control blood pressure, cholesterol, glucose, increase exercise.   Hypotension due to hypovolemia  -increase fluid intake, discussed slow position changes, encourage weight gain, compression hose -cont to monitor at home -exercise as tolerated ***  Hyperlipidemia -not aggressively pursued secondary to severe malnutrition/underweight and age -cont diet and exercise as tolerated - defer checking lipid panel per shared decision making ***  Other abnormal glucose -cont diet and exercise as tolerated   Vitamin D deficiency -cont supplement -cont calcium with it   Medication management -cont biannual lab testing   Fibrocystic breast changes, bilateral -Declines mammograms, continue breast exams  Avascular necrosis of hip (HCC) - declined replacement, monitor  Moderate thoracolumbar scoliosis/ chronic back pain Resistant to meds, discussed ortho/spine referral but declines Try topical voltaren 2g QID, topical lidocaine patches (4%) BID Try to increase activity level Strongly consider moving into assisted living discussed with son [name] ***  Severe protein/calorie malnutrition, severely underweight/generalized weakness Long history of alleged food intolerances and obsessive restrictive eating patterns weights improving with closely supervised meals.  Reviewed high calorie/protein diet,   Peripheral edema - Check CMP/GFR, UA, micro - denies cardiac sx, likely due to poor nutrition status and sedentary lifestyle, pain limits extremity elevation  - reports too painful for compression -  will check labs, cautiously try low dose HCTZ 12.5 with close supervision of BP by son/home aid, elevate feet as much as possible, low sodium diet and start applying compression as able. If not improving, suggested wrap on compression with sequential tightening. ***   No orders of the defined types were placed in this encounter.   Over 30 minutes of exam, counseling, chart review, and critical decision making was performed Future Appointments  Date Time Provider [place]  08/18/2021 11:30 AM Liane Comber, NP [facility] None  05/12/2022  9:00 AM Liane Comber, NP [facility] None     Plan:   During the course of the visit the patient was educated and counseled about appropriate screening and preventive services including:   Pneumococcal vaccine  Influenza vaccine Td vaccine Prevnar 13 Screening electrocardiogram Screening mammography Bone densitometry screening Colorectal cancer screening Diabetes screening Glaucoma screening Nutrition counseling  Advanced directives: given info/requested copies    Subjective:   Shelly Barrett is a 80 y.o. female who presents for AWV. She has Carotid stenosis; Hyperlipidemia; Vitamin D deficiency; Fibrocystic breast changes, bilateral; Body mass index (BMI) less than 16.5; Protein-calorie malnutrition, severe ([place]); Aortic atherosclerosis ([place]) by CT scan on 12/14/2017; Scoliosis of thoracolumbar spine; Hypotension; Bladder infection, chronic; Avascular necrosis of hip ([place]); Severely underweight adult; B12 deficiency; and Peripheral edema on their problem list.   She is accompanied by her son [name].   She was seen in ED 08/02/2021 c/o generalized weakness, increased dyspnea w exertion, and periods confusion for a week. 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   Patient has a Therapist, sports with her from 9a to 5p daily.  She has chronic  LBP from from DJD/DDD and moderately severe thoracolumbar scoliosis though she has been very resistant to taking any medications to manage pain. She endorses nausea/emesis with tylenol. She had an EDSI for L4/L5 HNP in 2002.  She has regular appt's with a Chiropractor  for "adjustments".  Ambulates with walker in home. We have discussed need for transitioned to higher level of care for him but she has been very resistant to this. She was trying topical ***  Patient also has long hx/o chronic malnutrition  (5'2" /75# - BMI 13.79 in 06/12/2020) from poor intake attributed to obsessive eating patterns and alleged food allergies/intolerances. She has has hx/o low Vit B12, low serum iron, Nl CBC and hx/o Lactose Intolerance. She was prescribed  Remeron for appetite which she stopped stopped claiming it caused diarrhea. Weights are fortunately improving with close monitored meals by son and caregiver in home. She has intermittent constipation, uses stool softener/sennakot as needed.   BMI is There is no height or weight on file to calculate BMI., she has been working on diet, working to gain weight. She has been eating ice cream. Son brings breakfast daily. Home care worker helps with meal during the day.  Wt Readings from Last 3 Encounters:  08/02/21 85 lb (38.6 kg)  05/12/21 86 lb 9.6 oz (39.3 kg)  01/28/21 83 lb 12.8 oz (38 kg)   Her blood pressure has been controlled at home, today their BP is   She does not workout. She denies chest pain, shortness of breath. She does endorse dizziness with lying to standing.   Concerned about moderate/severe bil ankle edema x 1-2 months.   CT scan on 12/14/2017 showed Aortic Atherosclerosis.   She is not on cholesterol medication and denies myalgias. Her cholesterol is not at goal but not pursued in light of age and severe malnutrition. The cholesterol last visit was:   Lab  Results  Component Value Date   CHOL 263 (H) 05/12/2020   HDL 110 05/12/2020   LDLCALC 133 (H) 05/12/2020   TRIG 92 05/12/2020   CHOLHDL 2.4 05/12/2020   She has been working on diet and exercise for glucose management.  No evidence of polydipsia, polyphagia, or polyuria.   Lab Results  Component Value Date   HGBA1C 5.6 01/28/2021   Last GFR Lab Results  Component Value Date   GFRNONAA >60 08/02/2021   Patient is on Vitamin D supplement, taking gummies, takes 6000 IU when son reminds, working on increasing compliance.  Lab Results  Component Value Date   VD25OH 30 01/28/2021           Medication Review Current Outpatient Medications on File Prior to Visit  Medication Sig Dispense Refill   amitriptyline (ELAVIL) 10 MG tablet TAKE 1 TABLET BY MOUTH THREE TIMES DAILY WITH MEALS (Patient taking differently: Take 10 mg by mouth in the morning and at bedtime. TAKE 1 TABLET BY MOUTH THREE TIMES DAILY WITH MEALS) 270 tablet 1   cholecalciferol (VITAMIN D) 1000 UNITS tablet Take 2,000 Units by mouth daily.      diclofenac Sodium (VOLTAREN) 1 % GEL Apply 2 g topically 4 (four) times daily. For joint pain. (Patient not taking: Reported on 08/02/2021) 350 g 1   gabapentin (NEURONTIN) 100 MG capsule TAKE 1 TO 3 CAPSULES(100 TO 300 MG) BY MOUTH THREE TIMES DAILY (Patient not taking: Reported on 08/02/2021) 90 capsule 0   hydrochlorothiazide (HYDRODIURIL) 12.5 MG tablet Take 1-2 tab per day as needed for swelling. (Patient not taking:  Reported on 08/02/2021) 60 tablet 3   No current facility-administered medications on file prior to visit.    Current Problems (verified) Patient Active Problem List   Diagnosis Date Noted   Peripheral edema 05/12/2021   B12 deficiency 05/13/2020   Severely underweight adult 05/12/2020   Avascular necrosis of hip ([place]) 05/08/2020   Bladder infection, chronic 09/28/2019   Hypotension 07/03/2019   Scoliosis of thoracolumbar spine 01/19/2018   Aortic  atherosclerosis ([place]) by CT scan on 12/14/2017 12/14/2017   Protein-calorie malnutrition, severe ([place]) 06/23/2017   Body mass index (BMI) less than 16.5 12/31/2016   Fibrocystic breast changes, bilateral 08/28/2015   Hyperlipidemia    Vitamin D deficiency    Carotid stenosis 05/28/2010    Screening Tests Immunization History  Administered Date(s) Administered   Influenza, High Dose Seasonal PF 07/30/2014, 06/25/2015, 07/01/2016, 07/12/2017, 07/05/2018, 06/21/2019   Influenza-Unspecified 06/20/2013   PFIZER(Purple Top)SARS-COV-2 Vaccination 10/17/2020   Pneumococcal Conjugate-13 09/18/2014   Pneumococcal-Unspecified 09/16/2009   Tdap 11/17/2010   Zoster, Live 09/16/2009    Preventative care:  Last colonoscopy: 10/2016, 1small polyp, mod sigmoid diverticulum, DONE per Dr. Carlean Purl Last mammogram: 09/2015 declines due to pain, does self breast exams Last pap smear/pelvic exam: remote   DEXA: 12/2014 declines another, pain with exam, would decline alendronate, taking calcium/vitamin D supplement   DOESN'T WANT IMAGING AS WOULD NOT PURSUE TREATMENT  Prior vaccinations: TD or Tdap: 2012  Influenza 2020 Pneumococcal: 2010 Prevnar13: 2015  Shingles/Zostavax: 2010 Covid 19: 2/2, 2021, moderna + booster   Names of Other Physician/Practitioners you currently use: 1. [facility]- here for primary care 2. Dr. Delman Cheadle, eye doctor, last visit 2021, goes annually, wears glasses, monitoring cataracts - patient doesn't want  3. Dr. Mariea Clonts, dentist, last visit 2019, goes q76m, overdue due to covid 19  Patient Care Team: [name], MD as PCP - General (Internal Medicine) Gatha Mayer, MD as Consulting Physician (Gastroenterology)  Allergies Allergies  Allergen Reactions   Betadine [Povidone Iodine]    Ciprocinonide [Fluocinolone]    Ciprofloxacin Hcl     Hives   Flexeril [Cyclobenzaprine]    Lactose Intolerance (Gi)     Diarrhea, cramping    Macrobid [Nitrofurantoin Macrocrystal] Diarrhea, Nausea And Vomiting and Swelling   Naprosyn [Naproxen]    Penicillins     REACTION: rash Documentation from [place] in 2012 patient was on zosyn then on keflex without known adverse reaction   Shellfish Allergy    Sodium Benzoate [Nutritional Supplements]    Iodinated Diagnostic Agents Rash    SURGICAL HISTORY She  has a past surgical history that includes Breast surgery (1980, 2012); Tubal ligation; Tonsillectomy and adenoidectomy; and Colonoscopy. FAMILY HISTORY Her family history includes Arthritis in her sister; Cancer in her father; Heart disease in her father and mother; Hyperlipidemia in her brother; Hypertension in her mother. SOCIAL HISTORY She  reports that she has never smoked. She has never used smokeless tobacco. She reports that she does not drink alcohol and does not use drugs.    Review of Systems  Constitutional:  Negative for malaise/fatigue and weight loss.  HENT:  Negative for hearing loss and tinnitus.   Eyes:  Negative for blurred vision and double vision.  Respiratory:  Negative for cough, sputum production, shortness of breath and wheezing.   Cardiovascular:  Positive for leg swelling (bil ankles, 1-2 months). Negative for chest pain, palpitations, orthopnea, claudication and PND.  Gastrointestinal:  Positive for constipation (intermittent, manages with meds). Negative  for abdominal pain, blood in stool, diarrhea, heartburn, melena, nausea and vomiting.  Genitourinary: Negative.   Musculoskeletal:  Positive for back pain. Negative for falls, joint pain and myalgias.  Skin:  Negative for rash.  Neurological:  Negative for dizziness, tingling, sensory change, weakness and headaches.  Endo/Heme/Allergies:  Negative for polydipsia.  Psychiatric/Behavioral: Negative.  Negative for depression, memory loss, substance abuse and suicidal ideas. The patient is not nervous/anxious and does not have insomnia.   All  other systems reviewed and are negative.  Objective:   There were no vitals filed for this visit.   There is no height or weight on file to calculate BMI.  General appearance: alert, no distress, extremely thin/frail WD/WN,  female HEENT: normocephalic, sclerae anicteric, TMs pearly, nares patent, no discharge or erythema, pharynx normal Oral cavity: MMM, no lesions Neck: supple, no lymphadenopathy, no thyromegaly, no masses Heart: RRR, normal S1, S2, no murmurs Lungs: CTA bilaterally, no wheezes, rhonchi, or rales Expansion significantly limited by scoliosis.  Abdomen: +bs, mildly firm throughout, non tender, non distended, no masses, no hepatomegaly, no splenomegaly Musculoskeletal: nontender, she has moderate/severe kyphoscoliosis.  Extremities: 2+ pitting edema bil ankles and pedal; no cyanosis, no clubbing Pulses: 2+ symmetric, upper and lower extremities, normal cap refill Neurological: alert, oriented x 3, CN2-12 intact, strength 3/5 throughout, upper extremities and lower extremities, sensation normal throughout, very unsteady/weak, requires assistance for sitting to standing and ambulation Psychiatric: normal affect, behavior normal, pleasant  Breast: Breasts: breasts appear normal, no suspicious masses, no skin or nipple changes or axillary nodes, bilateral implants, no palpable abnormalities otherwise. Skin: very dry, thin, flaking; no concerning rash, lesions, ecchymoses Gyn: declines Rectal: defer  EKG: NSR, IRBBB  The patient's weight, height, BMI, and visual acuity have been recorded in the chart.  I have made referrals, counseling, and provided education to the patient based on review of the above and I have provided the patient with a written personalized care plan for preventive services.     Izora Ribas, NP   08/14/2021

## 2021-08-17 ENCOUNTER — Ambulatory Visit: Payer: PPO | Admitting: Adult Health

## 2021-08-18 ENCOUNTER — Ambulatory Visit: Payer: PPO | Admitting: Adult Health

## 2021-08-18 DIAGNOSIS — E559 Vitamin D deficiency, unspecified: Secondary | ICD-10-CM

## 2021-08-18 DIAGNOSIS — E43 Unspecified severe protein-calorie malnutrition: Secondary | ICD-10-CM

## 2021-08-18 DIAGNOSIS — I959 Hypotension, unspecified: Secondary | ICD-10-CM

## 2021-08-18 DIAGNOSIS — M87059 Idiopathic aseptic necrosis of unspecified femur: Secondary | ICD-10-CM

## 2021-08-18 DIAGNOSIS — Z681 Body mass index (BMI) 19 or less, adult: Secondary | ICD-10-CM

## 2021-08-18 DIAGNOSIS — E538 Deficiency of other specified B group vitamins: Secondary | ICD-10-CM

## 2021-08-18 DIAGNOSIS — R636 Underweight: Secondary | ICD-10-CM

## 2021-08-18 DIAGNOSIS — I700 Atherosclerosis of aorta: Secondary | ICD-10-CM

## 2021-08-18 DIAGNOSIS — Z0000 Encounter for general adult medical examination without abnormal findings: Secondary | ICD-10-CM

## 2021-08-18 DIAGNOSIS — E785 Hyperlipidemia, unspecified: Secondary | ICD-10-CM

## 2021-08-18 DIAGNOSIS — I6529 Occlusion and stenosis of unspecified carotid artery: Secondary | ICD-10-CM

## 2021-08-18 DIAGNOSIS — M419 Scoliosis, unspecified: Secondary | ICD-10-CM

## 2021-08-18 DIAGNOSIS — N3020 Other chronic cystitis without hematuria: Secondary | ICD-10-CM

## 2021-09-04 ENCOUNTER — Other Ambulatory Visit: Payer: Self-pay | Admitting: Adult Health

## 2021-09-04 DIAGNOSIS — E43 Unspecified severe protein-calorie malnutrition: Secondary | ICD-10-CM

## 2021-09-07 DIAGNOSIS — E785 Hyperlipidemia, unspecified: Secondary | ICD-10-CM | POA: Diagnosis not present

## 2021-09-07 DIAGNOSIS — Z681 Body mass index (BMI) 19 or less, adult: Secondary | ICD-10-CM | POA: Diagnosis not present

## 2021-09-07 DIAGNOSIS — E46 Unspecified protein-calorie malnutrition: Secondary | ICD-10-CM | POA: Diagnosis not present

## 2021-12-27 ENCOUNTER — Emergency Department (HOSPITAL_COMMUNITY): Payer: PPO

## 2021-12-27 ENCOUNTER — Inpatient Hospital Stay (HOSPITAL_COMMUNITY): Payer: PPO

## 2021-12-27 ENCOUNTER — Encounter (HOSPITAL_COMMUNITY): Payer: Self-pay | Admitting: Emergency Medicine

## 2021-12-27 ENCOUNTER — Inpatient Hospital Stay (HOSPITAL_COMMUNITY)
Admission: EM | Admit: 2021-12-27 | Discharge: 2022-01-08 | DRG: 393 | Disposition: A | Discharge status: Hospice | Payer: PPO | Attending: Family Medicine | Admitting: Family Medicine

## 2021-12-27 DIAGNOSIS — E872 Acidosis, unspecified: Secondary | ICD-10-CM | POA: Diagnosis not present

## 2021-12-27 DIAGNOSIS — E860 Dehydration: Secondary | ICD-10-CM | POA: Diagnosis not present

## 2021-12-27 DIAGNOSIS — R54 Age-related physical debility: Secondary | ICD-10-CM | POA: Diagnosis not present

## 2021-12-27 DIAGNOSIS — Z515 Encounter for palliative care: Secondary | ICD-10-CM | POA: Diagnosis not present

## 2021-12-27 DIAGNOSIS — K6389 Other specified diseases of intestine: Secondary | ICD-10-CM | POA: Diagnosis not present

## 2021-12-27 DIAGNOSIS — R112 Nausea with vomiting, unspecified: Secondary | ICD-10-CM

## 2021-12-27 DIAGNOSIS — Z66 Do not resuscitate: Secondary | ICD-10-CM | POA: Diagnosis not present

## 2021-12-27 DIAGNOSIS — J1282 Pneumonia due to coronavirus disease 2019: Secondary | ICD-10-CM | POA: Diagnosis present

## 2021-12-27 DIAGNOSIS — E43 Unspecified severe protein-calorie malnutrition: Secondary | ICD-10-CM | POA: Diagnosis present

## 2021-12-27 DIAGNOSIS — R4182 Altered mental status, unspecified: Secondary | ICD-10-CM | POA: Diagnosis not present

## 2021-12-27 DIAGNOSIS — N200 Calculus of kidney: Secondary | ICD-10-CM | POA: Diagnosis not present

## 2021-12-27 DIAGNOSIS — Z8042 Family history of malignant neoplasm of prostate: Secondary | ICD-10-CM | POA: Diagnosis not present

## 2021-12-27 DIAGNOSIS — Z880 Allergy status to penicillin: Secondary | ICD-10-CM

## 2021-12-27 DIAGNOSIS — G9341 Metabolic encephalopathy: Secondary | ICD-10-CM | POA: Diagnosis not present

## 2021-12-27 DIAGNOSIS — J189 Pneumonia, unspecified organism: Secondary | ICD-10-CM

## 2021-12-27 DIAGNOSIS — R1900 Intra-abdominal and pelvic swelling, mass and lump, unspecified site: Secondary | ICD-10-CM | POA: Diagnosis not present

## 2021-12-27 DIAGNOSIS — U071 COVID-19: Secondary | ICD-10-CM | POA: Diagnosis present

## 2021-12-27 DIAGNOSIS — E739 Lactose intolerance, unspecified: Secondary | ICD-10-CM | POA: Diagnosis present

## 2021-12-27 DIAGNOSIS — K56609 Unspecified intestinal obstruction, unspecified as to partial versus complete obstruction: Secondary | ICD-10-CM

## 2021-12-27 DIAGNOSIS — E785 Hyperlipidemia, unspecified: Secondary | ICD-10-CM | POA: Diagnosis present

## 2021-12-27 DIAGNOSIS — Z681 Body mass index (BMI) 19 or less, adult: Secondary | ICD-10-CM | POA: Diagnosis not present

## 2021-12-27 DIAGNOSIS — N281 Cyst of kidney, acquired: Secondary | ICD-10-CM | POA: Diagnosis not present

## 2021-12-27 DIAGNOSIS — Z4682 Encounter for fitting and adjustment of non-vascular catheter: Secondary | ICD-10-CM | POA: Diagnosis not present

## 2021-12-27 DIAGNOSIS — E559 Vitamin D deficiency, unspecified: Secondary | ICD-10-CM | POA: Diagnosis not present

## 2021-12-27 DIAGNOSIS — I10 Essential (primary) hypertension: Secondary | ICD-10-CM | POA: Diagnosis present

## 2021-12-27 DIAGNOSIS — K450 Other specified abdominal hernia with obstruction, without gangrene: Secondary | ICD-10-CM | POA: Diagnosis not present

## 2021-12-27 DIAGNOSIS — Z91013 Allergy to seafood: Secondary | ICD-10-CM | POA: Diagnosis not present

## 2021-12-27 DIAGNOSIS — Z79899 Other long term (current) drug therapy: Secondary | ICD-10-CM

## 2021-12-27 DIAGNOSIS — E876 Hypokalemia: Secondary | ICD-10-CM | POA: Diagnosis present

## 2021-12-27 DIAGNOSIS — Z7189 Other specified counseling: Secondary | ICD-10-CM | POA: Diagnosis not present

## 2021-12-27 DIAGNOSIS — I2510 Atherosclerotic heart disease of native coronary artery without angina pectoris: Secondary | ICD-10-CM | POA: Diagnosis present

## 2021-12-27 DIAGNOSIS — Z881 Allergy status to other antibiotic agents status: Secondary | ICD-10-CM | POA: Diagnosis not present

## 2021-12-27 DIAGNOSIS — F0390 Unspecified dementia without behavioral disturbance: Secondary | ICD-10-CM

## 2021-12-27 DIAGNOSIS — F03A0 Unspecified dementia, mild, without behavioral disturbance, psychotic disturbance, mood disturbance, and anxiety: Secondary | ICD-10-CM | POA: Diagnosis not present

## 2021-12-27 DIAGNOSIS — E46 Unspecified protein-calorie malnutrition: Secondary | ICD-10-CM | POA: Diagnosis not present

## 2021-12-27 DIAGNOSIS — J984 Other disorders of lung: Secondary | ICD-10-CM | POA: Diagnosis not present

## 2021-12-27 DIAGNOSIS — K5669 Other partial intestinal obstruction: Secondary | ICD-10-CM | POA: Diagnosis not present

## 2021-12-27 DIAGNOSIS — K5939 Other megacolon: Secondary | ICD-10-CM | POA: Diagnosis not present

## 2021-12-27 DIAGNOSIS — Z751 Person awaiting admission to adequate facility elsewhere: Secondary | ICD-10-CM

## 2021-12-27 DIAGNOSIS — J9811 Atelectasis: Secondary | ICD-10-CM | POA: Diagnosis not present

## 2021-12-27 DIAGNOSIS — K5900 Constipation, unspecified: Secondary | ICD-10-CM | POA: Diagnosis not present

## 2021-12-27 DIAGNOSIS — Z91041 Radiographic dye allergy status: Secondary | ICD-10-CM | POA: Diagnosis not present

## 2021-12-27 DIAGNOSIS — Z8249 Family history of ischemic heart disease and other diseases of the circulatory system: Secondary | ICD-10-CM

## 2021-12-27 DIAGNOSIS — R7303 Prediabetes: Secondary | ICD-10-CM | POA: Diagnosis present

## 2021-12-27 DIAGNOSIS — Z83438 Family history of other disorder of lipoprotein metabolism and other lipidemia: Secondary | ICD-10-CM

## 2021-12-27 DIAGNOSIS — R410 Disorientation, unspecified: Secondary | ICD-10-CM | POA: Diagnosis not present

## 2021-12-27 DIAGNOSIS — R000 Tachycardia, unspecified: Secondary | ICD-10-CM | POA: Diagnosis not present

## 2021-12-27 DIAGNOSIS — R64 Cachexia: Secondary | ICD-10-CM | POA: Diagnosis present

## 2021-12-27 DIAGNOSIS — R1110 Vomiting, unspecified: Secondary | ICD-10-CM | POA: Diagnosis not present

## 2021-12-27 DIAGNOSIS — Z808 Family history of malignant neoplasm of other organs or systems: Secondary | ICD-10-CM

## 2021-12-27 LAB — MAGNESIUM: Magnesium: 2.4 mg/dL (ref 1.7–2.4)

## 2021-12-27 LAB — URINALYSIS, MICROSCOPIC (REFLEX)

## 2021-12-27 LAB — COMPREHENSIVE METABOLIC PANEL
ALT: 20 U/L (ref 0–44)
AST: 27 U/L (ref 15–41)
Albumin: 4.5 g/dL (ref 3.5–5.0)
Alkaline Phosphatase: 64 U/L (ref 38–126)
Anion gap: 14 (ref 5–15)
BUN: 63 mg/dL — ABNORMAL HIGH (ref 8–23)
CO2: 35 mmol/L — ABNORMAL HIGH (ref 22–32)
Calcium: 10.2 mg/dL (ref 8.9–10.3)
Chloride: 90 mmol/L — ABNORMAL LOW (ref 98–111)
Creatinine, Ser: 0.79 mg/dL (ref 0.44–1.00)
GFR, Estimated: >60 mL/min (ref >60)
Glucose, Bld: 141 mg/dL — ABNORMAL HIGH (ref 70–99)
Potassium: 3 mmol/L — ABNORMAL LOW (ref 3.5–5.1)
Sodium: 139 mmol/L (ref 135–145)
Total Bilirubin: 0.7 mg/dL (ref 0.3–1.2)
Total Protein: 7.6 g/dL (ref 6.5–8.1)

## 2021-12-27 LAB — TYPE AND SCREEN
ABO/RH(D): O POS
Antibody Screen: NEGATIVE

## 2021-12-27 LAB — URINALYSIS, ROUTINE W REFLEX MICROSCOPIC
Bilirubin Urine: NEGATIVE
Glucose, UA: NEGATIVE mg/dL
Ketones, ur: NEGATIVE mg/dL
Leukocytes,Ua: NEGATIVE
Nitrite: NEGATIVE
Protein, ur: NEGATIVE mg/dL
Specific Gravity, Urine: 1.025 (ref 1.005–1.030)
pH: 6 (ref 5.0–8.0)

## 2021-12-27 LAB — CBC WITH DIFFERENTIAL/PLATELET
Abs Immature Granulocytes: 0.02 10*3/uL (ref 0.00–0.07)
Basophils Absolute: 0 10*3/uL (ref 0.0–0.1)
Basophils Relative: 0 %
Eosinophils Absolute: 0 10*3/uL (ref 0.0–0.5)
Eosinophils Relative: 0 %
HCT: 46.9 % — ABNORMAL HIGH (ref 36.0–46.0)
Hemoglobin: 15.5 g/dL — ABNORMAL HIGH (ref 12.0–15.0)
Immature Granulocytes: 0 %
Lymphocytes Relative: 6 %
Lymphs Abs: 0.5 10*3/uL — ABNORMAL LOW (ref 0.7–4.0)
MCH: 30.5 pg (ref 26.0–34.0)
MCHC: 33 g/dL (ref 30.0–36.0)
MCV: 92.1 fL (ref 80.0–100.0)
Monocytes Absolute: 0.8 10*3/uL (ref 0.1–1.0)
Monocytes Relative: 11 %
Neutro Abs: 6.1 10*3/uL (ref 1.7–7.7)
Neutrophils Relative %: 83 %
Platelets: 278 10*3/uL (ref 150–400)
RBC: 5.09 MIL/uL (ref 3.87–5.11)
RDW: 15.9 % — ABNORMAL HIGH (ref 11.5–15.5)
WBC: 7.4 10*3/uL (ref 4.0–10.5)
nRBC: 0 % (ref 0.0–0.2)

## 2021-12-27 LAB — RESP PANEL BY RT-PCR (FLU A&B, COVID) ARPGX2
Influenza A by PCR: NEGATIVE
Influenza B by PCR: NEGATIVE
SARS Coronavirus 2 by RT PCR: POSITIVE — AB

## 2021-12-27 LAB — PROTIME-INR
INR: 1.1 (ref 0.8–1.2)
Prothrombin Time: 14.1 seconds (ref 11.4–15.2)

## 2021-12-27 LAB — LACTIC ACID, PLASMA
Lactic Acid, Venous: 3 mmol/L (ref 0.5–1.9)
Lactic Acid, Venous: 1.6 mmol/L (ref 0.5–1.9)
Lactic Acid, Venous: 2 mmol/L (ref 0.5–1.9)
Lactic Acid, Venous: 1.5 mmol/L (ref 0.5–1.9)

## 2021-12-27 LAB — TROPONIN I (HIGH SENSITIVITY)
Troponin I (High Sensitivity): 12 ng/L (ref <18)
Troponin I (High Sensitivity): 11 ng/L (ref <18)

## 2021-12-27 LAB — ABO/RH: ABO/RH(D): O POS

## 2021-12-27 MED ORDER — METRONIDAZOLE 500 MG/100ML IV SOLN
500.0000 mg | Freq: Two times a day (BID) | INTRAVENOUS | Status: DC
  Administered 2021-12-28: 500 mg via INTRAVENOUS
  Filled 2021-12-27: qty 100

## 2021-12-27 MED ORDER — SODIUM CHLORIDE 0.9 % IV SOLN: INTRAVENOUS | Status: DC

## 2021-12-27 MED ORDER — SODIUM CHLORIDE 0.9 % IV BOLUS
1000.0000 mL | Freq: Once | INTRAVENOUS | Status: AC
  Administered 2021-12-27: 1000 mL via INTRAVENOUS

## 2021-12-27 MED ORDER — SODIUM CHLORIDE 0.9 % IV SOLN
2.0000 g | Freq: Two times a day (BID) | INTRAVENOUS | Status: DC
  Administered 2021-12-28 – 2021-12-30 (×5): 2 g via INTRAVENOUS
  Filled 2021-12-27 (×6): qty 2

## 2021-12-27 MED ORDER — LORAZEPAM 2 MG/ML IJ SOLN
0.5000 mg | Freq: Once | INTRAMUSCULAR | Status: AC
Start: 2021-12-27 — End: 2021-12-27
  Administered 2021-12-27: 0.5 mg via INTRAVENOUS
  Filled 2021-12-27: qty 1

## 2021-12-27 MED ORDER — CEFEPIME HCL 2 G IJ SOLR
2.0000 g | Freq: Once | INTRAMUSCULAR | Status: AC
  Administered 2021-12-27: 2 g via INTRAVENOUS
  Filled 2021-12-27: qty 2

## 2021-12-27 MED ORDER — METRONIDAZOLE 500 MG/100ML IV SOLN
500.0000 mg | Freq: Two times a day (BID) | INTRAVENOUS | Status: DC
  Administered 2021-12-27: 500 mg via INTRAVENOUS
  Filled 2021-12-27: qty 100

## 2021-12-27 MED ORDER — POTASSIUM CHLORIDE 10 MEQ/100ML IV SOLN
10.0000 meq | INTRAVENOUS | Status: AC
  Administered 2021-12-27 – 2021-12-28 (×4): 10 meq via INTRAVENOUS
  Filled 2021-12-27 (×4): qty 100

## 2021-12-27 MED ORDER — ONDANSETRON HCL 4 MG PO TABS: 4.0000 mg | ORAL_TABLET | Freq: Four times a day (QID) | ORAL | Status: DC | PRN

## 2021-12-27 MED ORDER — MORPHINE SULFATE (PF) 2 MG/ML IV SOLN
2.0000 mg | Freq: Once | INTRAVENOUS | Status: AC
  Administered 2021-12-27: 2 mg via INTRAVENOUS
  Filled 2021-12-27: qty 1

## 2021-12-27 MED ORDER — ONDANSETRON HCL 4 MG/2ML IJ SOLN: 4.0000 mg | Freq: Four times a day (QID) | INTRAMUSCULAR | Status: DC | PRN

## 2021-12-27 MED ORDER — VANCOMYCIN HCL IN DEXTROSE 1-5 GM/200ML-% IV SOLN
1000.0000 mg | Freq: Once | INTRAVENOUS | Status: AC
  Administered 2021-12-27: 1000 mg via INTRAVENOUS
  Filled 2021-12-27: qty 200

## 2021-12-27 MED ORDER — POTASSIUM CHLORIDE 10 MEQ/100ML IV SOLN
10.0000 meq | Freq: Once | INTRAVENOUS | Status: AC
  Administered 2021-12-27: 10 meq via INTRAVENOUS
  Filled 2021-12-27: qty 100

## 2021-12-27 NOTE — Progress Notes (Signed)
Redness noted on  upper spine, blanchable. Foam dressing applied. Reposition on Rt. Side. ?

## 2021-12-27 NOTE — Progress Notes (Signed)
Repeat Lactic Acid 1.5 ?

## 2021-12-27 NOTE — Progress Notes (Signed)
A consult was received from an ED physician for vancomycin and cefepime per pharmacy dosing (for an indication other than meningitis). The patient's profile has been reviewed for ht/wt/allergies/indication/available labs. A one time order has been placed for the above antibiotics.  Further antibiotics/pharmacy consults should be ordered by admitting physician if indicated.       ?                ?Reuel Boom, PharmD, BCPS ?[phone] ?12/27/2021, 4:43 PM ? ?

## 2021-12-27 NOTE — H&P (Signed)
?History and Physical  ? ? ?Patient: Shelly Barrett KWI:[id number] DOB: [birth year] ?DOA: 12/27/2021 ?DOS: the patient was seen and examined on 12/27/2021 ?PCP: [name], MD  ?Patient coming from: Home ? ?Chief Complaint:  ?Chief Complaint  ?Patient presents with  ? Altered Mental Status  ? Emesis  ? ?HPI: Shelly Barrett is a 81 y.o. female with medical history significant of dementia, HTN, scoliosis, malnutrition. Presenting w/ N/V. History is from son as the patient is confused. The son reports that the patient has been having difficulty with eating over the last several days. He notes that she has progressively become more nauseous and confused as these last 4 days have continued. Yesterday she didn't want to eat at all after having several episodes of vomiting. This morning she vomited her breakfast, so he called for her [facility] nurse to evaluate her. It was found that she was febrile, tachycardic and confused. They were concerned and so, she was brought to the ED for evaluation.   ? ?Review of Systems: unable to review all systems due to the inability of the patient to answer questions. ?Past Medical History:  ?Diagnosis Date  ? Arthritis   ? E coli bacteremia 07/03/2019  ? Hyperlipidemia   ? Ileus ([place]) 07/03/2019  ? Left femoral hernia without obstruction or gangrene 12/14/2017  ? On CT 12/13/2017  ? Prediabetes   ? Scoliosis   ? Sepsis ([place]) 07/02/2019  ? Sigmoid diverticulitis   ? Vitamin D deficiency   ? ?Past Surgical History:  ?Procedure Laterality Date  ? [place], 2012  ? implants  ? COLONOSCOPY    ? TONSILLECTOMY AND ADENOIDECTOMY    ? TUBAL LIGATION    ? ?Social History:  reports that she has never smoked. She has never used smokeless tobacco. She reports that she does not drink alcohol and does not use drugs. ? ?Allergies  ?Allergen Reactions  ? Betadine [Povidone Iodine]   ? Ciprocinonide [Fluocinolone]   ? Ciprofloxacin Hcl   ?  Hives  ? Flexeril [Cyclobenzaprine]   ? Lactose Intolerance (Gi)    ?  Diarrhea, cramping  ? Macrobid [Nitrofurantoin Macrocrystal] Diarrhea, Nausea And Vomiting and Swelling  ? Naprosyn [Naproxen]   ? Penicillins   ?  REACTION: rash ?Documentation from [place] in 2012 patient was on zosyn then on keflex without known adverse reaction  ? Shellfish Allergy   ? Sodium Benzoate [Nutritional Supplements]   ? Iodinated Contrast Media Rash  ? ? ?Family History  ?Problem Relation Age of Onset  ? Heart disease Mother   ? Hypertension Mother   ? Heart disease Father   ? Cancer Father   ?     throat and prostate  ? Arthritis Sister   ?     RA  ? Hyperlipidemia Brother   ? Colon cancer Neg Hx   ? Stomach cancer Neg Hx   ? Rectal cancer Neg Hx   ? Esophageal cancer Neg Hx   ? Alcohol abuse Neg Hx   ? Liver cancer Neg Hx   ? ? ?Prior to Admission medications   ?Medication Sig Start Date End Date Taking? Authorizing Provider  ?amitriptyline (ELAVIL) 10 MG tablet TAKE 1 TABLET BY MOUTH THREE TIMES DAILY WITH MEALS ?Patient taking differently: Take 10 mg by mouth at bedtime. TAKE 1 TABLET BY MOUTH THREE TIMES DAILY WITH MEALS 09/04/21  Yes Liane Comber, NP  ?Calcium Glycerophosphate (PRELIEF) 340 (65-50) MG (CA-P) TABS Take 1 tablet by mouth  daily.   Yes [provider]  ?cholecalciferol (VITAMIN D) 1000 UNITS tablet Take 2,000 Units by mouth daily.    Yes [provider]  ?lactase (LACTAID) 3000 units tablet Take 3,000 Units by mouth 3 (three) times daily with meals.   Yes [provider]  ?Multiple Vitamin (MULTIVITAMIN) tablet Take 1 tablet by mouth daily.   Yes [provider]  ?diclofenac Sodium (VOLTAREN) 1 % GEL Apply 2 g topically 4 (four) times daily. For joint pain. ?Patient not taking: Reported on 08/02/2021 05/12/21   Liane Comber, NP  ?gabapentin (NEURONTIN) 100 MG capsule TAKE 1 TO 3 CAPSULES(100 TO 300 MG) BY MOUTH THREE TIMES DAILY ?Patient not taking: Reported on 08/02/2021 06/23/21   Magda Bernheim, NP  ?hydrochlorothiazide (HYDRODIURIL)  12.5 MG tablet Take 1-2 tab per day as needed for swelling. ?Patient not taking: Reported on 08/02/2021 05/12/21   Liane Comber, NP  ? ? ?Physical Exam: ?Vitals:  ? 12/27/21 1400 12/27/21 1444 12/27/21 1545 12/27/21 1630  ?BP: 125/72 (!) 142/64 114/64 126/65  ?Pulse: (!) 107 89 99 95  ?Resp: '13 12 13 12  '$ ?Temp:      ?TempSrc:      ?SpO2: 94% 92% 92% 95%  ? ?General: 81 y.o. female resting in bed in NAD ?Eyes: PERRL, normal sclera ?ENMT: Nares patent w/o discharge, orophaynx clear, dentition normal, ears w/o discharge/lesions/ulcers ?Neck: thin, trachea midline ?Cardiovascular: RRR, +S1, S2, no m/g/r, equal pulses throughout ?Respiratory: CTABL, no w/r/r, normal WOB ?GI: BS hypoactive, NDNT, no masses noted, no organomegaly noted ?Neuro: A&O x 2 (name/place), no focal deficits ?Psyc: confused, flat affect, calm/cooperative ? ?Data Reviewed: ? ?K+  3.0 ?BUN  63 ?SCr  0.79 ?Hgb 15.5 ?WBC  7.4 ?CO2  35 ?Glucose  141 ? ?CT chest/ab/pelvis: 1. High-grade small bowel obstruction at a RIGHT obturator hernia containing a loop of small bowel with moderate to severe distension of the stomach and proximal-mid small bowel loops. No evidence of pneumoperitoneum. ?2. RIGHT LOWER lobe airspace disease likely representing pneumonia or aspiration. Trace pleural effusions and bibasilar atelectasis. ?3. Remote appearing compression fractures of the cervical, thoracic, lumbar spine, and pelvis. Correlate clinically. ?4. RIGHT nephrolithiasis. ?5. Coronary artery disease. ?6. Aortic Atherosclerosis (ICD10-I70.0). ? ?CTH: 1. No evidence of acute intracranial abnormality. 2. Atrophy and chronic small-vessel white matter ischemic changes. ? ?Assessment and Plan: ?No notes have been filed under this hospital service. ?Service: Hospitalist ?SBO ?    - admit to inpt, tele ?    - general surgery to see ?    - place NGT, initiate SBO protocol with the exception of the contrasted studies as she had an iodine allergy; will defer alternate imaging  to surgery ?    - fluids ?    - remainder per surgical team ? ?RLL PNA ?SIRS ?    - started on broad spec abx; source of infection now is RLL PNA and could have possible ab infection ?    - can d/c vanc, will continue cefepime/flagyl for now ? ?Acute on chronic metabolic encephalopathy ?Dementia ?    - has baseline dementia; acute portion likely secondary to infection; continue treatment as above ? ?HTN ?    - normotensive now d/t illness, follow ? ?Hypokalemia ?    - replace K+, check Mg2+ ? ?Severe protein-calorie malnutrition ?    - dietician consult after resolution of SBO ? ? Advance Care Planning:   Code Status: DNR ? ?Consults: General Surgery ? ?Family Communication:  w/ son by phone ? ?Severity of Illness: ?The appropriate patient status for this patient is INPATIENT. Inpatient status is judged to be reasonable and necessary in order to provide the required intensity of service to ensure the patient's safety. The patient's presenting symptoms, physical exam findings, and initial radiographic and laboratory data in the context of their chronic comorbidities is felt to place them at high risk for further clinical deterioration. Furthermore, it is not anticipated that the patient will be medically stable for discharge from the hospital within 2 midnights of admission.  ? ?* I certify that at the point of admission it is my clinical judgment that the patient will require inpatient hospital care spanning beyond 2 midnights from the point of admission due to high intensity of service, high risk for further deterioration and high frequency of surveillance required.* ? ?Author: ?Jonnie Finner, DO ?12/27/2021 4:44 PM ? ?For on call review [email or web address].  ?

## 2021-12-27 NOTE — ED Notes (Signed)
Pt refusing imaging d/t concerns about radiation. This nurse attempted education. ?

## 2021-12-27 NOTE — Progress Notes (Signed)
NG tube placed in left nare (size 12)  at 67 cm. Pt tolerated well.  KUB per protocol.  Pt primary, RN to follow-up. ?

## 2021-12-27 NOTE — ED Provider Notes (Signed)
?[facility] DEPT ?Provider Note ? ? ?CSN: [id number] ?Arrival date & time: 12/27/21  1237 ? ?  ? ?History ? ?Chief Complaint  ?Patient presents with  ? Altered Mental Status  ? Emesis  ? ? ?Shelly Barrett is a 81 y.o. female. ? ?81 year old female with prior medical history as detailed below presents for evaluation.  She arrives by EMS.  She is accompanied by her son who has a healthcare power of attorney.  Patient with mild dementia at baseline.  Patient with decreased p.o. intake x3 to 4 days.  Patient with intermittent vomiting that is worsened over the last 36 hours.  Patient was abdominal distention that has become more evident over the last 24 hours.  Patient with reported temperature of 100.1 at home yesterday evening. ? ?Patient is conversational at baseline.  She has problems with short-term memory. ? ?Son reports that the patient is DNR/DNI. ? ?Patient is currently comfortable.  She complains of vague abdominal discomfort secondary to distention of her abdomen. ? ?The history is provided by the patient and medical records.  ?Altered Mental Status ?Presenting symptoms: confusion and disorientation   ?Severity:  Moderate ?Most recent episode:  2 days ago ?Episode history:  Continuous ?Duration:  3 days ?Timing:  Constant ?Progression:  Worsening ?Associated symptoms: nausea and vomiting   ?Emesis ? ?  ? ?Home Medications ?Prior to Admission medications   ?Medication Sig Start Date End Date Taking? Authorizing Provider  ?amitriptyline (ELAVIL) 10 MG tablet TAKE 1 TABLET BY MOUTH THREE TIMES DAILY WITH MEALS 09/04/21   Liane Comber, NP  ?cholecalciferol (VITAMIN D) 1000 UNITS tablet Take 2,000 Units by mouth daily.     [provider]  ?diclofenac Sodium (VOLTAREN) 1 % GEL Apply 2 g topically 4 (four) times daily. For joint pain. ?Patient not taking: Reported on 08/02/2021 05/12/21   Liane Comber, NP  ?gabapentin (NEURONTIN) 100 MG capsule TAKE 1 TO 3 CAPSULES(100 TO  300 MG) BY MOUTH THREE TIMES DAILY ?Patient not taking: Reported on 08/02/2021 06/23/21   Magda Bernheim, NP  ?hydrochlorothiazide (HYDRODIURIL) 12.5 MG tablet Take 1-2 tab per day as needed for swelling. ?Patient not taking: Reported on 08/02/2021 05/12/21   Liane Comber, NP  ?   ? ?Allergies    ?Betadine [povidone iodine], Ciprocinonide [fluocinolone], Ciprofloxacin hcl, Flexeril [cyclobenzaprine], Lactose intolerance (gi), Macrobid [nitrofurantoin macrocrystal], Naprosyn [naproxen], Penicillins, Shellfish allergy, Sodium benzoate [nutritional supplements], and Iodinated contrast media   ? ?Review of Systems   ?Review of Systems  ?Gastrointestinal:  Positive for nausea and vomiting.  ?Psychiatric/Behavioral:  Positive for confusion.   ?All other systems reviewed and are negative. ? ?Physical Exam ?Updated Vital Signs ?BP 110/88   Pulse (!) 102   Temp 99 ?F (37.2 ?C) (Rectal)   Resp (!) 33   SpO2 95%  ?Physical Exam ?Vitals and nursing note reviewed.  ?Constitutional:   ?   General: She is not in acute distress. ?   Appearance: She is well-developed.  ?   Comments: Chronically ill in appearance ? ?  ?HENT:  ?   Head: Normocephalic and atraumatic.  ?   Mouth/Throat:  ?   Mouth: Mucous membranes are dry.  ?Eyes:  ?   Conjunctiva/sclera: Conjunctivae normal.  ?   Pupils: Pupils are equal, round, and reactive to light.  ?Cardiovascular:  ?   Rate and Rhythm: Normal rate and regular rhythm.  ?   Heart sounds: Normal heart sounds.  ?Pulmonary:  ?   Effort:  Pulmonary effort is normal. No respiratory distress.  ?   Breath sounds: Normal breath sounds.  ?Abdominal:  ?   General: There is distension.  ?   Palpations: Abdomen is soft.  ?   Tenderness: There is no abdominal tenderness.  ?Musculoskeletal:     ?   General: No deformity. Normal range of motion.  ?   Cervical back: Normal range of motion and neck supple.  ?Skin: ?   General: Skin is warm and dry.  ?Neurological:  ?   General: No focal deficit present.  ?    Mental Status: She is alert and oriented to person, place, and time.  ? ? ?ED Results / Procedures / Treatments   ?Labs ?(all labs ordered are listed, but only abnormal results are displayed) ?Labs Reviewed  ?CULTURE, BLOOD (ROUTINE X 2)  ?CULTURE, BLOOD (ROUTINE X 2)  ?RESP PANEL BY RT-PCR (FLU A&B, COVID) ARPGX2  ?LACTIC ACID, PLASMA  ?LACTIC ACID, PLASMA  ?COMPREHENSIVE METABOLIC PANEL  ?CBC WITH DIFFERENTIAL/PLATELET  ?PROTIME-INR  ?URINALYSIS, ROUTINE W REFLEX MICROSCOPIC  ?TYPE AND SCREEN  ?TROPONIN I (HIGH SENSITIVITY)  ? ? ?EKG ?EKG Interpretation ? ?Date/Time:  Sunday December 27 2021 13:06:29 EDT ?Ventricular Rate:  105 ?PR Interval:  130 ?QRS Duration: 68 ?QT Interval:  346 ?QTC Calculation: 458 ?R Axis:   66 ?Text Interpretation: Sinus tachycardia Probable anteroseptal infarct, old Nonspecific repol abnormality, inferior leads Confirmed by Dene Gentry [phone]) on 12/27/2021 1:16:13 PM ? ?Radiology ?No results found. ? ?Procedures ?Procedures  ? ? ?Medications Ordered in ED ?Medications  ?sodium chloride 0.9 % bolus 1,000 mL (has no administration in time range)  ? ? ?ED Course/ Medical Decision Making/ A&P ?  ?                        ?Medical Decision Making ?Amount and/or Complexity of Data Reviewed ?Labs: ordered. ?Radiology: ordered. ? ?Risk ?Prescription drug management. ?Decision regarding hospitalization. ? ? ? ?Medical Screen Complete ? ?This patient presented to the ED with complaint of decreased p.o. intake, nausea, vomiting, abdominal distention, confusion. ? ?This complaint involves an extensive number of treatment options. The initial differential diagnosis includes, but is not limited to, infection, obstruction, metabolic abnormality, etc. ? ?This presentation is: Acute, Chronic, Self-Limited, Previously Undiagnosed, Uncertain Prognosis, Complicated, Systemic Symptoms, and Threat to Life/Bodily Function ? ?Patient presents with her son.  Son is healthcare power of attorney.  Patient is  DNR/DNI.  Patient with 3 days of nausea and vomiting.  Patient with decreased p.o. intake. ? ?Patient with increased abdominal distention. ? ?Work-up is suggestive of dehydration.  Additionally, possible right lower lobe infiltrate possibly related to aspiration as found on CT imaging. ? ?CT of the abdomen pelvis is concerning for possible high-grade obstruction. ? ?Patient would benefit from admission. ? ?Patient's son is aware of CT imaging findings and plan of care.  Patient's son is agreeable with IV fluids, antibiotics, and placement of NG tube. ? ?Dr. Ninfa Linden of surgery will consult. ? ?Hospitalist service is aware of case will evaluate. ? ?Co morbidities that complicated the patient's evaluation ? ?Advanced age, dementia ? ? ?Additional history obtained: ? ?Additional history obtained from patient's son ?External records from outside sources obtained and reviewed including prior ED visits and prior Inpatient records.  ? ? ?Lab Tests: ? ?I ordered and personally interpreted labs.  The pertinent results include: CBC, CMP, INR, UA, COVID, flu, lactic acid ? ? ?Imaging Studies ordered: ? ?I ordered  imaging studies including CT head, CT chest, CT abdomen pelvis, chest x-ray ?I independently visualized and interpreted obtained imaging which showed possible right lower lobe infiltrate, possible obstruction ?I agree with the radiologist interpretation. ? ? ?Cardiac Monitoring: ? ?The patient was maintained on a cardiac monitor.  I personally viewed and interpreted the cardiac monitor which showed an underlying rhythm of: Sinus tachycardia ? ? ?Medicines ordered: ? ?I ordered medication including morphine, Ativan, antibiotics for pain and anxiety, infection ?Reevaluation of the patient after these medicines showed that the patient: improved ? ?Problem List / ED Course: ? ?Bowel obstruction, possible aspiration pneumonia, dehydration ? ? ?Reevaluation: ? ?After the interventions noted above, I reevaluated the patient  and found that they have: improved ? ?Disposition: ? ?After consideration of the diagnostic results and the patients response to treatment, I feel that the patent would benefit from admission.  ? ? ?CRITICAL CARE ?Per

## 2021-12-27 NOTE — ED Triage Notes (Signed)
Patient BIB, AMS with vomiting and abdominal distension x3 days. A&Ox1 during triage. This is not baseline per son. ?

## 2021-12-27 NOTE — Progress Notes (Signed)
PHARMACY NOTE -  Cefepime ? ?Pharmacy has been assisting with dosing of cefepime for sepsis. ?Dosage remains stable at 2g IV q12 hr and further renal adjustments per institutional Pharmacy antibiotic protocol ? ?Pharmacy will sign off, following peripherally for culture results or dose adjustments. Please reconsult if a change in clinical status warrants re-evaluation of dosage. ? ?Reuel Boom, PharmD, BCPS ?[phone] ?12/27/2021, 6:16 PM ? ? ? ?

## 2021-12-27 NOTE — Consult Note (Addendum)
Reason for Consult:Small bowel obstruction ?Referring Physician: Dr. Cherylann Ratel ? ?Shelly Barrett is an 81 y.o. female.  ?HPI: This is a 81 year old female with a history of dementia and malnutrition as well as hypertension scoliosis with arthritis who presented with a 3 to 4-day ay history of abdominal pain with nausea and vomiting.  She has baseline dementia but is able to live at her home with caregivers and occasional cook.  She can carry on conversations until his current problem started.  She has now become progressively more confused.  She is unable to give any history.  I obtained the history through a phone discussion with her son who is the power of attorney.  She has had no previous abdominal surgery.  She underwent a CT scan showing an obturator hernia on the right side causing high-grade bowel obstruction. ?Currently she is a DNR. ? ?Past Medical History:  ?Diagnosis Date  ? Arthritis   ? E coli bacteremia 07/03/2019  ? Hyperlipidemia   ? Ileus ([place]) 07/03/2019  ? Left femoral hernia without obstruction or gangrene 12/14/2017  ? On CT 12/13/2017  ? Prediabetes   ? Scoliosis   ? Sepsis ([place]) 07/02/2019  ? Sigmoid diverticulitis   ? Vitamin D deficiency   ? ? ?Past Surgical History:  ?Procedure Laterality Date  ? [place], 2012  ? implants  ? COLONOSCOPY    ? TONSILLECTOMY AND ADENOIDECTOMY    ? TUBAL LIGATION    ? ? ?Family History  ?Problem Relation Age of Onset  ? Heart disease Mother   ? Hypertension Mother   ? Heart disease Father   ? Cancer Father   ?     throat and prostate  ? Arthritis Sister   ?     RA  ? Hyperlipidemia Brother   ? Colon cancer Neg Hx   ? Stomach cancer Neg Hx   ? Rectal cancer Neg Hx   ? Esophageal cancer Neg Hx   ? Alcohol abuse Neg Hx   ? Liver cancer Neg Hx   ? ? ?Social History:  reports that she has never smoked. She has never used smokeless tobacco. She reports that she does not drink alcohol and does not use drugs. ? ?Allergies:  ?Allergies  ?Allergen Reactions  ?  Betadine [Povidone Iodine]   ? Ciprocinonide [Fluocinolone]   ? Ciprofloxacin Hcl   ?  Hives  ? Flexeril [Cyclobenzaprine]   ? Lactose Intolerance (Gi)   ?  Diarrhea, cramping  ? Macrobid [Nitrofurantoin Macrocrystal] Diarrhea, Nausea And Vomiting and Swelling  ? Naprosyn [Naproxen]   ? Penicillins   ?  REACTION: rash ?Documentation from [place] in 2012 patient was on zosyn then on keflex without known adverse reaction  ? Shellfish Allergy   ? Sodium Benzoate [Nutritional Supplements]   ? Iodinated Contrast Media Rash  ? ? ?Medications: I have reviewed the patient's current medications. ? ?Results for orders placed or performed during the hospital encounter of 12/27/21 (from the past 48 hour(s))  ?Type and screen [place]     Status: None  ? Collection Time: 12/27/21  1:40 PM  ?Result Value Ref Range  ? ABO/RH(D) O POS   ? Antibody Screen NEG   ? Sample Expiration    ?  12/30/2021,2359 ?Performed at [facility], [place] [address] [place], [place] [address] ?  ?Lactic acid, plasma     Status: Abnormal  ? Collection Time: 12/27/21  1:45 PM  ?  Result Value Ref Range  ? Lactic Acid, Venous 3.0 (HH) 0.5 - 1.9 mmol/L  ?  Comment: CRITICAL RESULT CALLED TO, READ BACK BY AND VERIFIED WITH: ?SHAW,S AT 1445 ON 12/27/2021 BY LUZOLOP ?Performed at [facility], [place] [address] [place], [place] [address] ?  ?Troponin I (High Sensitivity)     Status: None  ? Collection Time: 12/27/21  1:45 PM  ?Result Value Ref Range  ? Troponin I (High Sensitivity) 12 <18 ng/L  ?  Comment: (NOTE) ?Elevated high sensitivity troponin I (hsTnI) values and significant  ?changes across serial measurements may suggest ACS but many other  ?chronic and acute conditions are known to elevate hsTnI results.  ?Refer to the "Links" section for chest pain algorithms and additional  ?guidance. ?Performed at [facility], [place] [address] ?[place], [place] [address] ?  ?Comprehensive  metabolic panel     Status: Abnormal  ? Collection Time: 12/27/21  1:45 PM  ?Result Value Ref Range  ? Sodium 139 135 - 145 mmol/L  ? Potassium 3.0 (L) 3.5 - 5.1 mmol/L  ? Chloride 90 (L) 98 - 111 mmol/L  ? CO2 35 (H) 22 - 32 mmol/L  ? Glucose, Bld 141 (H) 70 - 99 mg/dL  ?  Comment: Glucose reference range applies only to samples taken after fasting for at least 8 hours.  ? BUN 63 (H) 8 - 23 mg/dL  ? Creatinine, Ser 0.79 0.44 - 1.00 mg/dL  ? Calcium 10.2 8.9 - 10.3 mg/dL  ? Total Protein 7.6 6.5 - 8.1 g/dL  ? Albumin 4.5 3.5 - 5.0 g/dL  ? AST 27 15 - 41 U/L  ? ALT 20 0 - 44 U/L  ? Alkaline Phosphatase 64 38 - 126 U/L  ? Total Bilirubin 0.7 0.3 - 1.2 mg/dL  ? GFR, Estimated >60 >60 mL/min  ?  Comment: (NOTE) ?Calculated using the CKD-EPI Creatinine Equation (2021) ?  ? Anion gap 14 5 - 15  ?  Comment: Performed at [facility], [place] [address], [place], [place] [address]  ?CBC with Differential     Status: Abnormal  ? Collection Time: 12/27/21  1:45 PM  ?Result Value Ref Range  ? WBC 7.4 4.0 - 10.5 K/uL  ? RBC 5.09 3.87 - 5.11 MIL/uL  ? Hemoglobin 15.5 (H) 12.0 - 15.0 g/dL  ? HCT 46.9 (H) 36.0 - 46.0 %  ? MCV 92.1 80.0 - 100.0 fL  ? MCH 30.5 26.0 - 34.0 pg  ? MCHC 33.0 30.0 - 36.0 g/dL  ? RDW 15.9 (H) 11.5 - 15.5 %  ? Platelets 278 150 - 400 K/uL  ? nRBC 0.0 0.0 - 0.2 %  ? Neutrophils Relative % 83 %  ? Neutro Abs 6.1 1.7 - 7.7 K/uL  ? Lymphocytes Relative 6 %  ? Lymphs Abs 0.5 (L) 0.7 - 4.0 K/uL  ? Monocytes Relative 11 %  ? Monocytes Absolute 0.8 0.1 - 1.0 K/uL  ? Eosinophils Relative 0 %  ? Eosinophils Absolute 0.0 0.0 - 0.5 K/uL  ? Basophils Relative 0 %  ? Basophils Absolute 0.0 0.0 - 0.1 K/uL  ? Immature Granulocytes 0 %  ? Abs Immature Granulocytes 0.02 0.00 - 0.07 K/uL  ?  Comment: Performed at [facility], [place] [address], [place], [place] [address]  ?ABO/Rh     Status: None  ? Collection Time: 12/27/21  1:45 PM  ?Result Value Ref Range  ? ABO/RH(D)    ?  O POS ?Performed  at [facility], [place] [address] [place], [place] [address] ?  ?Magnesium     Status: None  ? Collection Time: 12/27/21  1:45 PM  ?Result Value Ref Range  ? Magnesium 2.4 1.7 - 2.4 mg/dL  ?  Comment: Performed at [facility], [place] [address], [place], [place] [address]  ?Protime-INR     Status: None  ? Collection Time: 12/27/21  2:10 PM  ?Result Value Ref Range  ? Prothrombin Time 14.1 11.4 - 15.2 seconds  ? INR 1.1 0.8 - 1.2  ?  Comment: (NOTE) ?INR goal varies based on device and disease states. ?Performed at [facility], [place] [address] ?[place], [place] [address] ?  ?Lactic acid, plasma     Status: None  ? Collection Time: 12/27/21  4:10 PM  ?Result Value Ref Range  ? Lactic Acid, Venous 1.6 0.5 - 1.9 mmol/L  ?  Comment: Performed at [facility], [place] [address] [place], [place] [address]  ?Urinalysis, Routine w reflex microscopic     Status: Abnormal  ? Collection Time: 12/27/21  4:16 PM  ?Result Value Ref Range  ? Color, Urine YELLOW YELLOW  ? APPearance CLEAR CLEAR  ? Specific Gravity, Urine 1.025 1.005 - 1.030  ? pH 6.0 5.0 - 8.0  ? Glucose, UA NEGATIVE NEGATIVE mg/dL  ? Hgb urine dipstick SMALL (A) NEGATIVE  ? Bilirubin Urine NEGATIVE NEGATIVE  ? Ketones, ur NEGATIVE NEGATIVE mg/dL  ? Protein, ur NEGATIVE NEGATIVE mg/dL  ? Nitrite NEGATIVE NEGATIVE  ? Leukocytes,Ua NEGATIVE NEGATIVE  ?  Comment: Performed at [facility], [place] [address] [place], [place] [address]  ?Urinalysis, Microscopic (reflex)     Status: Abnormal  ? Collection Time: 12/27/21  4:16 PM  ?Result Value Ref Range  ? RBC / HPF 0-5 0 - 5 RBC/hpf  ? WBC, UA 0-5 0 - 5 WBC/hpf  ? Bacteria, UA FEW (A) NONE SEEN  ? Squamous Epithelial / LPF 0-5 0 - 5  ? Mucus PRESENT   ?  Comment: Performed at [facility], [place] [address] [place],  [address]  ?Troponin I (High Sensitivity)     Status: None  ? Collection Time: 12/27/21  4:33  PM  ?Result Value Ref Range  ? Troponin I (High Sensitivity) 11 <18 ng/L  ?  Comment: (NOTE) ?Elevated high sensitivity troponin I (hsTnI) values and significant  ?changes across serial measurements

## 2021-12-27 NOTE — Progress Notes (Signed)
Received call from lab technician Dierdre Highman, critical lab result of Latic Acid 2.0. On call provider notified.  ? ? ?

## 2021-12-28 ENCOUNTER — Inpatient Hospital Stay (HOSPITAL_COMMUNITY): Payer: PPO

## 2021-12-28 DIAGNOSIS — G9341 Metabolic encephalopathy: Secondary | ICD-10-CM | POA: Diagnosis not present

## 2021-12-28 DIAGNOSIS — Z7189 Other specified counseling: Secondary | ICD-10-CM | POA: Diagnosis not present

## 2021-12-28 DIAGNOSIS — Z515 Encounter for palliative care: Secondary | ICD-10-CM

## 2021-12-28 DIAGNOSIS — F0390 Unspecified dementia without behavioral disturbance: Secondary | ICD-10-CM

## 2021-12-28 DIAGNOSIS — J189 Pneumonia, unspecified organism: Secondary | ICD-10-CM

## 2021-12-28 DIAGNOSIS — K56609 Unspecified intestinal obstruction, unspecified as to partial versus complete obstruction: Secondary | ICD-10-CM | POA: Diagnosis not present

## 2021-12-28 DIAGNOSIS — E43 Unspecified severe protein-calorie malnutrition: Secondary | ICD-10-CM

## 2021-12-28 LAB — CBC
HCT: 39.9 % (ref 36.0–46.0)
Hemoglobin: 13.2 g/dL (ref 12.0–15.0)
MCH: 30.3 pg (ref 26.0–34.0)
MCHC: 33.1 g/dL (ref 30.0–36.0)
MCV: 91.7 fL (ref 80.0–100.0)
Platelets: 200 10*3/uL (ref 150–400)
RBC: 4.35 MIL/uL (ref 3.87–5.11)
RDW: 15.6 % — ABNORMAL HIGH (ref 11.5–15.5)
WBC: 5.5 10*3/uL (ref 4.0–10.5)
nRBC: 0 % (ref 0.0–0.2)

## 2021-12-28 LAB — COMPREHENSIVE METABOLIC PANEL
ALT: 16 U/L (ref 0–44)
AST: 22 U/L (ref 15–41)
Albumin: 3.2 g/dL — ABNORMAL LOW (ref 3.5–5.0)
Alkaline Phosphatase: 47 U/L (ref 38–126)
Anion gap: 10 (ref 5–15)
BUN: 43 mg/dL — ABNORMAL HIGH (ref 8–23)
CO2: 29 mmol/L (ref 22–32)
Calcium: 8.7 mg/dL — ABNORMAL LOW (ref 8.9–10.3)
Chloride: 100 mmol/L (ref 98–111)
Creatinine, Ser: 0.56 mg/dL (ref 0.44–1.00)
GFR, Estimated: >60 mL/min (ref >60)
Glucose, Bld: 107 mg/dL — ABNORMAL HIGH (ref 70–99)
Potassium: 3.6 mmol/L (ref 3.5–5.1)
Sodium: 139 mmol/L (ref 135–145)
Total Bilirubin: 0.9 mg/dL (ref 0.3–1.2)
Total Protein: 5.5 g/dL — ABNORMAL LOW (ref 6.5–8.1)

## 2021-12-28 LAB — CORTISOL-AM, BLOOD: Cortisol - AM: 42.3 ug/dL — ABNORMAL HIGH (ref 6.7–22.6)

## 2021-12-28 LAB — PROTIME-INR
INR: 1.1 (ref 0.8–1.2)
Prothrombin Time: 14 seconds (ref 11.4–15.2)

## 2021-12-28 LAB — PROCALCITONIN: Procalcitonin: 0.1 ng/mL

## 2021-12-28 MED ORDER — ORAL CARE MOUTH RINSE
15.0000 mL | Freq: Two times a day (BID) | OROMUCOSAL | Status: DC
  Administered 2021-12-28 – 2022-01-08 (×15): 15 mL via OROMUCOSAL

## 2021-12-28 MED ORDER — SODIUM CHLORIDE 0.9 % IV BOLUS
500.0000 mL | Freq: Once | INTRAVENOUS | Status: AC
  Administered 2021-12-28: 500 mL via INTRAVENOUS

## 2021-12-28 MED ORDER — FENTANYL CITRATE PF 50 MCG/ML IJ SOSY
25.0000 ug | PREFILLED_SYRINGE | INTRAMUSCULAR | Status: DC | PRN
  Administered 2021-12-29: 25 ug via INTRAVENOUS
  Filled 2021-12-28: qty 1

## 2021-12-28 NOTE — Progress Notes (Signed)
?PROGRESS NOTE ? ? ? ?Shelly Barrett  EXN:[id number] DOB: [birth year] DOA: 12/27/2021 ?PCP: [name], MD  ? ?Brief Narrative:  ?81 y.o. female with medical history significant of dementia, HTN, scoliosis presented with altered mental status and vomiting.  On presentation, she was found to have high-grade small bowel obstruction at right obturator hernia containing a loop of small bowel with moderate to severe distention of the stomach and proximal and mid small bowel loops, no evidence of pneumoperitoneum with possible right lower lobe pneumonia or aspiration.  NG tube was placed General surgery was consulted.  She was started on IV fluids.  CT of the head was negative for acute intracranial abnormality. ? ?Assessment & Plan: ?  ?Small bowel obstruction from high-grade obstruction from a right obturator hernia ?-General surgery is following.  Currently being managed conservatively with IV fluids, NG tube, antiemetics and analgesics as needed. ?-Patient/family do not want any surgical intervention and requesting palliative care consultation.  I have placed a palliative care consultation request. ? ?Possible right lower lobe pneumonia ?SIRS ?- currently on cefepime and Flagyl.  DC Flagyl.  Continue cefepime. ? ?Acute metabolic encephalopathy ?Dementia ?-Possibly from above.  Patient has baseline dementia.  Fall precautions/delirium precautions. ? ?COVID-19 infection ?-Patient tested positive for COVID-19.  She is currently being treated for bacterial right lower lobe pneumonia.  Will check procalcitonin level in a.m. along with inflammatory markers.  Currently intermittently requiring 2 L oxygen via nasal cannula.  We will hold off on starting remdesivir/steroids.  Await palliative care discussions ? ?Hypertension ?-Blood pressure intermittently on the lower side.  Monitor ? ?Severe protein calorie malnutrition ?-Will need dietitian consult if her SBO resolves ? ? ?DVT prophylaxis: SCDs ?Code Status: DNR ?Family  Communication: Son at bedside ?Disposition Plan: ?Status is: Inpatient ?Remains inpatient appropriate because: Of severity of illness ? ?Consultants: General surgery/palliative care ? ?Procedures: None ? ?Antimicrobials: Cefepime and Flagyl from 12/27/2021 onwards ? ? ?Subjective: ?Patient seen and examined at bedside.  Poor historian.  Son at bedside as well.  Patient keeps asking for water.  Apparently patient pulled out her NG tube earlier this morning which was replaced.  No overnight agitation or fever reported. ? ?Objective: ?Vitals:  ? 12/28/21 0102 12/28/21 0153 12/28/21 0428 12/28/21 0800  ?BP: 108/64  (!) 113/51   ?Pulse: (!) 104  (!) 105   ?Resp: '16 15 16 19  '$ ?Temp: 98.4 ?F (36.9 ?C)  98.7 ?F (37.1 ?C)   ?TempSrc: Oral  Oral   ?SpO2: 93%  94%   ?Weight:      ?Height:      ? ? ?Intake/Output Summary (Last 24 hours) at 12/28/2021 1130 ?Last data filed at 12/28/2021 0440 ?Gross per 24 hour  ?Intake 3500 ml  ?Output 1950 ml  ?Net 1550 ml  ? ?Filed Weights  ? 12/27/21 1852  ?Weight: 38 kg  ? ? ?Examination: ? ?General exam: Appears calm and comfortable.  Intermittently requiring 2 L oxygen via nasal cannula.  Currently in no acute distress ?ENT: NG tube present ?Respiratory system: Bilateral decreased breath sounds at bases with some scattered crackles ?Cardiovascular system: S1 & S2 heard, intermittently tachycardic  ?gastrointestinal system: Abdomen is distended; soft and mildly tender diffusely.  Bowel sounds sluggish  ?extremities: No cyanosis, clubbing; trace lower extremity edema  ?Central nervous system: Awake, confused.  Poor historian.  No focal neurological deficits. Moving extremities ?Skin: No rashes, lesions or ulcers ?Psychiatry: Flat affect.  No signs of agitation currently. ? ? ? ?Data  Reviewed: I have personally reviewed following labs and imaging studies ? ?CBC: ?Recent Labs  ?Lab 12/27/21 ?1345 12/28/21 ?1194  ?WBC 7.4 5.5  ?NEUTROABS 6.1  --   ?HGB 15.5* 13.2  ?HCT 46.9* 39.9  ?MCV 92.1 91.7   ?PLT 278 200  ? ?Basic Metabolic Panel: ?Recent Labs  ?Lab 12/27/21 ?1345 12/28/21 ?1740  ?NA 139 139  ?K 3.0* 3.6  ?CL 90* 100  ?CO2 35* 29  ?GLUCOSE 141* 107*  ?BUN 63* 43*  ?CREATININE 0.79 0.56  ?CALCIUM 10.2 8.7*  ?MG 2.4  --   ? ?GFR: ?Estimated Creatinine Clearance: 34.2 mL/min (by C-G formula based on SCr of 0.56 mg/dL). ?Liver Function Tests: ?Recent Labs  ?Lab 12/27/21 ?1345 12/28/21 ?8144  ?AST 27 22  ?ALT 20 16  ?ALKPHOS 64 47  ?BILITOT 0.7 0.9  ?PROT 7.6 5.5*  ?ALBUMIN 4.5 3.2*  ? ?No results for input(s): LIPASE, AMYLASE in the last 168 hours. ?No results for input(s): AMMONIA in the last 168 hours. ?Coagulation Profile: ?Recent Labs  ?Lab 12/27/21 ?1410 12/28/21 ?8185  ?INR 1.1 1.1  ? ?Cardiac Enzymes: ?No results for input(s): CKTOTAL, CKMB, CKMBINDEX, TROPONINI in the last 168 hours. ?BNP (last 3 results) ?No results for input(s): PROBNP in the last 8760 hours. ?HbA1C: ?No results for input(s): HGBA1C in the last 72 hours. ?CBG: ?No results for input(s): GLUCAP in the last 168 hours. ?Lipid Profile: ?No results for input(s): CHOL, HDL, LDLCALC, TRIG, CHOLHDL, LDLDIRECT in the last 72 hours. ?Thyroid Function Tests: ?No results for input(s): TSH, T4TOTAL, FREET4, T3FREE, THYROIDAB in the last 72 hours. ?Anemia Panel: ?No results for input(s): VITAMINB12, FOLATE, FERRITIN, TIBC, IRON, RETICCTPCT in the last 72 hours. ?Sepsis Labs: ?Recent Labs  ?Lab 12/27/21 ?1345 12/27/21 ?1610 12/27/21 ?2007 12/27/21 ?2216 12/28/21 ?0715  ?PROCALCITON  --   --   --   --  0.10  ?LATICACIDVEN 3.0* 1.6 2.0* 1.5  --   ? ? ?Recent Results (from the past 240 hour(s))  ?Culture, blood (routine x 2)     Status: None (Preliminary result)  ? Collection Time: 12/27/21  1:45 PM  ? Specimen: BLOOD  ?Result Value Ref Range Status  ? Specimen Description   Final  ?  BLOOD SITE NOT SPECIFIED ?Performed at [facility], [place] [address] [place], [place] [address] ?  ? Special Requests   Final  ?  BOTTLES DRAWN  AEROBIC AND ANAEROBIC Blood Culture adequate volume ?Performed at [facility], [place] [address], [place], [place] [address] ?  ? Culture   Final  ?  NO GROWTH < 24 HOURS ?Performed at [facility], [place] [address] [place], [place] [address] ?  ? Report Status PENDING  Incomplete  ?Resp Panel by RT-PCR (Flu A&B, Covid) Nasopharyngeal Swab     Status: Abnormal  ? Collection Time: 12/27/21 10:00 PM  ? Specimen: Nasopharyngeal Swab  ?Result Value Ref Range Status  ? SARS Coronavirus 2 by RT PCR POSITIVE (A) NEGATIVE Final  ?  Comment: (NOTE) ?SARS-CoV-2 target nucleic acids are DETECTED. ? ?The SARS-CoV-2 RNA is generally detectable in upper respiratory ?specimens during the acute phase of infection. Positive results are ?indicative of the presence of the identified virus, but do not rule ?out bacterial infection or co-infection with other pathogens not ?detected by the test. Clinical correlation with patient history and ?other diagnostic information is necessary to determine patient ?infection status. The expected result is Negative. ? ?Fact Sheet for Patients: ?EntrepreneurPulse.com.au ? ?  Fact Sheet for Healthcare Providers: ?IncredibleEmployment.be ? ?This test is not yet approved or cleared by the [place] FDA and  ?has been authorized for detection and/or diagnosis of SARS-CoV-2 by ?FDA under an Emergency Use Authorization (EUA).  This EUA will ?remain in effect (meaning this test can be used) for the duration of  ?the COVID-19 declaration under Section 564(b)(1) of the [place], 21 ?U.S[place]C. section 360bbb-3(b)(1), unless the authorization is ?terminated or revoked sooner. ? ?  ? Influenza A by PCR NEGATIVE NEGATIVE Final  ? Influenza B by PCR NEGATIVE NEGATIVE Final  ?  Comment: (NOTE) ?The Xpert Xpress SARS-CoV-2/FLU/RSV plus assay is intended as an aid ?in the diagnosis of influenza from Nasopharyngeal swab specimens and ?should not be used as a sole  basis for treatment. Nasal washings and ?aspirates are unacceptable for Xpert Xpress SARS-CoV-2/FLU/RSV ?testing. ? ?Fact Sheet for Patients: ?EntrepreneurPulse.com.au ? ?Fact Sheet for Healt

## 2021-12-28 NOTE — Progress Notes (Signed)
NG tube place again in left nares after she accidentally pulled it out. Tolerated well. Pt primary RN to follow-up with KUB ?

## 2021-12-28 NOTE — Progress Notes (Addendum)
? ? ?   ?Subjective: ?CC: ?Patient's son at bedside.  Patient complains of abdominal pain and distention.  NG tube was pulled out by patient overnight.  Replacement NG tube confirmed on x-ray this am.  This was returned to [facility] with immediate return of bilious fluid into cannister. Patient denies flatus or bm.  ? ?Objective: ?Vital signs in last 24 hours: ?Temp:  [97.5 ?F (36.4 ?C)-99 ?F (37.2 ?C)] 98.7 ?F (37.1 ?C) (03/20 0428) ?Pulse Rate:  [89-108] 105 (03/20 0428) ?Resp:  [12-33] 19 (03/20 0800) ?BP: (108-142)/(51-88) 113/51 (03/20 0428) ?SpO2:  [92 %-95 %] 94 % (03/20 0428) ?Weight:  [38 kg] 38 kg (03/19 1852) ?  ? ?Intake/Output from previous day: ?03/19 0701 - 03/20 0700 ?In: 3500 [IV Piggyback:3500] ?Out: 1950 [Urine:300; Emesis/NG output:1650] ?Intake/Output this shift: ?No intake/output data recorded. ? ?PE: ?Gen: Frail, elderly female lying in bed ?Pulm: Rate and effort normal ?Abd: Soft, some generalized tenderness without rigidity or grading. Hypoactive bowel sounds.  ?Ext:  No LE edema or calf tenderness ?Psych: A&Ox3  ?Skin: no rashes noted, warm and dry ? ?Lab Results:  ?Recent Labs  ?  12/27/21 ?1345 12/28/21 ?8315  ?WBC 7.4 5.5  ?HGB 15.5* 13.2  ?HCT 46.9* 39.9  ?PLT 278 200  ? ?BMET ?Recent Labs  ?  12/27/21 ?1345 12/28/21 ?1761  ?NA 139 139  ?K 3.0* 3.6  ?CL 90* 100  ?CO2 35* 29  ?GLUCOSE 141* 107*  ?BUN 63* 43*  ?CREATININE 0.79 0.56  ?CALCIUM 10.2 8.7*  ? ?PT/INR ?Recent Labs  ?  12/27/21 ?1410 12/28/21 ?6073  ?LABPROT 14.1 14.0  ?INR 1.1 1.1  ? ?CMP  ?   ?Component Value Date/Time  ? NA 139 12/28/2021 0509  ? K 3.6 12/28/2021 0509  ? CL 100 12/28/2021 0509  ? CO2 29 12/28/2021 0509  ? GLUCOSE 107 (H) 12/28/2021 0509  ? BUN 43 (H) 12/28/2021 0509  ? CREATININE 0.56 12/28/2021 0509  ? CREATININE 0.59 (L) 05/12/2021 1007  ? CALCIUM 8.7 (L) 12/28/2021 0509  ? PROT 5.5 (L) 12/28/2021 0509  ? ALBUMIN 3.2 (L) 12/28/2021 0509  ? AST 22 12/28/2021 0509  ? ALT 16 12/28/2021 0509  ? ALKPHOS 47  12/28/2021 0509  ? BILITOT 0.9 12/28/2021 0509  ? GFRNONAA >60 12/28/2021 0509  ? GFRNONAA 88 01/28/2021 0903  ? GFRAA 102 01/28/2021 0903  ? ?Lipase  ?No results found for: LIPASE ? ?Studies/Results: ?CT ABDOMEN PELVIS WO CONTRAST ? ?Result Date: 12/27/2021 ?CLINICAL DATA:  81 year old female with chest, abdominal and pelvic pain, shortness of breath and vomiting. EXAM: CT CHEST, ABDOMEN AND PELVIS WITHOUT CONTRAST TECHNIQUE: Multidetector CT imaging of the chest, abdomen and pelvis was performed following the standard protocol without IV contrast. RADIATION DOSE REDUCTION: This exam was performed according to the departmental dose-optimization program which includes automated exposure control, adjustment of the mA and/or kV according to patient size and/or use of iterative reconstruction technique. COMPARISON:  12/27/2021 and prior radiographs. 12/13/2017 abdominal and pelvic CT. FINDINGS: Please note that parenchymal and vascular abnormalities may be missed as intravenous contrast was not administered. CT CHEST FINDINGS Cardiovascular: Heart size is normal. Coronary artery and aortic atherosclerotic calcifications are present. No thoracic aortic aneurysm or pericardial effusion identified. Mediastinum/Nodes: The esophagus is distended. No mediastinal mass or enlarged lymph nodes identified. The visualized thyroid gland is unremarkable. Lungs/Pleura: RIGHT LOWER lobe airspace disease likely represents pneumonia or aspiration. Bibasilar atelectasis is present. Trace pleural effusions are noted. There is no evidence of pneumothorax.  Musculoskeletal: Severe thoracic kyphosis noted. Remote appearing compression fractures of C7, T3, T5, T7, T8 and T12 are noted. No definite acute bony abnormality noted. CT ABDOMEN PELVIS FINDINGS Hepatobiliary: The liver is unremarkable. What appears to be the gallbladder is unremarkable. There is no evidence of intrahepatic or extrahepatic biliary dilatation. Pancreas: Not well  visualized but no definite abnormality. Spleen: Unremarkable Adrenals/Urinary Tract: Nonobstructing RIGHT LOWER pole renal calculus noted. A LEFT renal cyst is identified. No acute abnormalities are noted. The visualized portions of the adrenal glands and bladder are unremarkable. Stomach/Bowel: There is moderate to severe distension of the stomach and proximal mid small bowel loops with high-grade obstruction point at a RIGHT obturator hernia containing a loop of small bowel. Collapsed distal small bowel loops are noted. Some gas and stool in the colon is identified. A small LEFT obturator hernia is also noted. There is no evidence of pneumoperitoneum. Vascular/Lymphatic: Aortic atherosclerosis. No enlarged abdominal or pelvic lymph nodes. Reproductive: No definite abnormality. Other: No ascites. Musculoskeletal: Degenerative changes in the lumbar spine are noted. Remote appearing fractures of L1, L5 and INFERIOR LEFT pubic ramus noted. No definite acute bony abnormality. IMPRESSION: 1. High-grade small bowel obstruction at a RIGHT obturator hernia containing a loop of small bowel with moderate to severe distension of the stomach and proximal-mid small bowel loops. No evidence of pneumoperitoneum. 2. RIGHT LOWER lobe airspace disease likely representing pneumonia or aspiration. Trace pleural effusions and bibasilar atelectasis. 3. Remote appearing compression fractures of the cervical, thoracic, lumbar spine, and pelvis. Correlate clinically. 4. RIGHT nephrolithiasis. 5. Coronary artery disease. 6. Aortic Atherosclerosis (ICD10-I70.0). Electronically Signed   By: Margarette Canada M.D.   On: 12/27/2021 16:04  ? ?DG Abd 1 View ? ?Result Date: 12/28/2021 ?CLINICAL DATA:  Orogastric tube placement EXAM: ABDOMEN - 1 VIEW COMPARISON:  None. FINDINGS: Nasogastric tube tip extends into the mid abdomen overlying the expected mid to distal body of the stomach. Dilated loop of small bowel again seen within the right mid abdomen in  keeping with small-bowel obstruction better seen on CT examination of 03/30 p.m. No free intraperitoneal gas. IMPRESSION: Nasogastric tube tip within the mid to distal body of the stomach. Electronically Signed   By: Fidela Salisbury M.D.   On: 12/28/2021 00:09  ? ?CT Head Wo Contrast ? ?Result Date: 12/27/2021 ?CLINICAL DATA:  81 year old female with altered mental status. EXAM: CT HEAD WITHOUT CONTRAST TECHNIQUE: Contiguous axial images were obtained from the base of the skull through the vertex without intravenous contrast. RADIATION DOSE REDUCTION: This exam was performed according to the departmental dose-optimization program which includes automated exposure control, adjustment of the mA and/or kV according to patient size and/or use of iterative reconstruction technique. COMPARISON:  02/03/2009 CT FINDINGS: Brain: No evidence of acute infarction, hemorrhage, hydrocephalus, extra-axial collection or mass lesion/mass effect. Atrophy and chronic small-vessel white matter ischemic changes are again noted. Vascular: Carotid and vertebral atherosclerotic calcifications are noted. Skull: No acute abnormality. Sinuses/Orbits: No acute abnormality Other: None IMPRESSION: 1. No evidence of acute intracranial abnormality. 2. Atrophy and chronic small-vessel white matter ischemic changes. Electronically Signed   By: Margarette Canada M.D.   On: 12/27/2021 16:10  ? ?CT Chest Wo Contrast ? ?Result Date: 12/27/2021 ?CLINICAL DATA:  81 year old female with chest, abdominal and pelvic pain, shortness of breath and vomiting. EXAM: CT CHEST, ABDOMEN AND PELVIS WITHOUT CONTRAST TECHNIQUE: Multidetector CT imaging of the chest, abdomen and pelvis was performed following the standard protocol without IV contrast. RADIATION DOSE REDUCTION:  This exam was performed according to the departmental dose-optimization program which includes automated exposure control, adjustment of the mA and/or kV according to patient size and/or use of iterative  reconstruction technique. COMPARISON:  12/27/2021 and prior radiographs. 12/13/2017 abdominal and pelvic CT. FINDINGS: Please note that parenchymal and vascular abnormalities may be missed as intravenous contrast was

## 2021-12-28 NOTE — Progress Notes (Signed)
Pt pulled her NG out in her sleep. NG tube replaced by [facility] RRT RN. KUB done awaiting result to confirm placement.  ? ?Pt had total of 1650 output form NG from 0100 AM to 0443AM. Pt stated, she felt better and has been asking for something to drink. Mouth care provided frequently to help with dry mouth.  ? ? ?

## 2021-12-28 NOTE — Consult Note (Signed)
? ?                                                                                ?Consultation Note ?Date: 12/28/2021  ? ?Patient Name: Shelly Barrett  ?DOB: 23-Jun-1941  MRN: [id number]  Age / Sex: 81 y.o., female  ?PCP: [name], MD ?Referring Physician: Aline August, MD ? ?Reason for Consultation: Establishing goals of care ? ?HPI/Patient Profile: 81 y.o. female  with past medical history of dementia, hypertension, scoliosis, malnutrition, ileus, sigmoid diuverticulitis, left femoral hernia without obstruction or gangrene admitted on 12/27/2021 with small bowel obstruction and right lower lobe pneumonia. Per surgery note patient and family do not desire surgical intervention but to desire conservative management with knowledge that there is low likelihood of recovery without surgery and that this could become terminal. Palliative consultation requested.  ? ?Clinical Assessment and Goals of Care: ?I met today with Shelly Barrett and her son, Shelly Barrett, at bedside. Shelly Barrett shares with me about his mother's health and gradual decline although she lives alone and gets around with walker but has bad back that limits her. Son comes every morning and helps her with breakfast and he has arranged caregivers in the home 10a-2p and 4p?-8p. Appetite was noted to be good - she loves ice cream. Her husband died in 10-31-2021. Her goals have been consistent that she does not desire resuscitation, extraordinary measures, surgical intervention. He knows she would never want to be bedbound or live in a nursing facility.  ? ?We spent time discussing expectations with different options. Discussed surgery and given her frailty and the extent of surgery I would not expect that she would recover to an acceptable quality of life (if she were to survive). We discussed conservative measures and that we can continue along with having measures in place to ease her suffering and pain as needed. Due to long history of back pain she has high  tolerance.  We discussed transition to hospice facility vs hospice at home and that we could leave in NGT to allow for comfort and liquid intake as desired. Some patients opt to remove NGT but then they are anticipated to require more medications to ensure their comfort. Shelly Barrett is able to confirm she does not desire any surgery acknowledging that this is likely to be a terminal event for her. At this time we will continue conservative measures, continue conversations with myself and surgery team, and reassess daily for need/desire to transition to more comfort/hospice care.  ? ?Assisted RN with getting Shelly Barrett on and off bedpan.  ? ?All questions/concerns addressed. Emotional support provided.  ? ?Primary Decision Maker ?PATIENT and son Shelly Barrett ?  ? ?SUMMARY OF RECOMMENDATIONS   ?- DNR ?- No surgical intervention desired ?- Considering timing of transition to comfort ?- Continue conservative treatment for now and medication as needed to provide symptom relief ? ?Code Status/Advance Care Planning: ?DNR ? ? ?Symptom Management:  ?PRN fentanyl for pain.  ? ?Palliative Prophylaxis:  ?Aspiration, Delirium Protocol, Frequent Pain Assessment, Oral Care, and Turn Reposition ? ?Prognosis:  ?Prognosis very poor.  ? ?Discharge Planning: To Be Determined  ? ?  ? ?Primary Diagnoses: ?Present on  Admission: ? SBO (small bowel obstruction) ([place]) ? Hyperlipidemia ? Protein-calorie malnutrition, severe ([place]) ? ? ?I have reviewed the medical record, interviewed the patient and family, and examined the patient. The following aspects are pertinent. ? ?Past Medical History:  ?Diagnosis Date  ? Arthritis   ? E coli bacteremia 07/03/2019  ? Hyperlipidemia   ? Ileus ([place]) 07/03/2019  ? Left femoral hernia without obstruction or gangrene 12/14/2017  ? On CT 12/13/2017  ? Prediabetes   ? Scoliosis   ? Sepsis ([place]) 07/02/2019  ? Sigmoid diverticulitis   ? Vitamin D deficiency   ? ?Social History  ? ?Socioeconomic History  ? Marital status:  Married  ?  Spouse name: Not on file  ? Number of children: 1  ? Years of education: Not on file  ? Highest education level: Not on file  ?Occupational History  ? Occupation: retired  ?Tobacco Use  ? Smoking status: Never  ? Smokeless tobacco: Never  ?Vaping Use  ? Vaping Use: Never used  ?Substance and Sexual Activity  ? Alcohol use: No  ? Drug use: No  ? Sexual activity: Not on file  ?Other Topics Concern  ? Not on file  ?Social History Narrative  ? Not on file  ? ?Social Determinants of Health  ? ?Financial Resource Strain: Not on file  ?Food Insecurity: Not on file  ?Transportation Needs: Not on file  ?Physical Activity: Not on file  ?Stress: Not on file  ?Social Connections: Not on file  ? ?Family History  ?Problem Relation Age of Onset  ? Heart disease Mother   ? Hypertension Mother   ? Heart disease Father   ? Cancer Father   ?     throat and prostate  ? Arthritis Sister   ?     RA  ? Hyperlipidemia Brother   ? Colon cancer Neg Hx   ? Stomach cancer Neg Hx   ? Rectal cancer Neg Hx   ? Esophageal cancer Neg Hx   ? Alcohol abuse Neg Hx   ? Liver cancer Neg Hx   ? ?Scheduled Meds: ? mouth rinse  15 mL Mouth Rinse BID  ? ?Continuous Infusions: ? sodium chloride 100 mL/hr at 12/28/21 0517  ? ceFEPime (MAXIPIME) IV 2 g (12/28/21 0559)  ? ?PRN Meds:.ondansetron **OR** ondansetron (ZOFRAN) IV ?Allergies  ?Allergen Reactions  ? Betadine [Povidone Iodine]   ? Ciprocinonide [Fluocinolone]   ? Ciprofloxacin Hcl   ?  Hives  ? Flexeril [Cyclobenzaprine]   ? Lactose Intolerance ([place])   ?  Diarrhea, cramping  ? Macrobid [Nitrofurantoin Macrocrystal] Diarrhea, Nausea And Vomiting and Swelling  ? Naprosyn [Naproxen]   ? Penicillins   ?  REACTION: rash ?Documentation from [facility] in 2012 patient was on zosyn then on keflex without known adverse reaction  ? Shellfish Allergy   ? Sodium Benzoate [Nutritional Supplements]   ? Iodinated Contrast Media Rash  ? ?Review of Systems  ?Constitutional:  Positive for activity change,  appetite change and fatigue.  ?Gastrointestinal:  Positive for abdominal distention. Negative for nausea and vomiting.  ?Neurological:  Positive for weakness.  ? ?Physical Exam ?Vitals and nursing note reviewed.  ?Constitutional:   ?   Appearance: She is cachectic. She is ill-appearing.  ?Cardiovascular:  ?   Rate and Rhythm: Tachycardia present.  ?Pulmonary:  ?   Effort: No tachypnea, accessory muscle usage or respiratory distress.  ?Abdominal:  ?   General: There is distension.  ?   Tenderness: There is  no abdominal tenderness.  ?Neurological:  ?   Mental Status: She is alert and oriented to person, place, and time.  ?   Comments: Very sleepy and doesn't always answer questions but answers appropriate when given  ? ? ?Vital Signs: BP (!) 113/51   Pulse (!) 105   Temp 98.7 ?F (37.1 ?C) (Oral)   Resp 19   Ht '5\' 1"'  (1.549 m)   Wt 38 kg   SpO2 94%   BMI 15.83 kg/m?  ?Pain Scale: 0-10 ?  ?Pain Score: Asleep ? ? ?SpO2: SpO2: 94 % ?O2 Device:SpO2: 94 % ?O2 Flow Rate: .O2 Flow Rate (L/min): 2 L/min ? ?IO: Intake/output summary:  ?Intake/Output Summary (Last 24 hours) at 12/28/2021 1254 ?Last data filed at 12/28/2021 0440 ?Gross per 24 hour  ?Intake 3500 ml  ?Output 1950 ml  ?Net 1550 ml  ? ? ?LBM:   ?Baseline Weight: Weight: 38 kg ?Most recent weight: Weight: 38 kg     ?Palliative Assessment/Data: ? ? ? ?Time Total: 120 min ? ?Greater than 50%  of this time was spent counseling and coordinating care related to the above assessment and plan. ? ?Signed by: ?Vinie Sill, NP ?Palliative Medicine Team ?Pager # [phone] (M-F 8a-5p) ?Team Phone # [phone] (Nights/Weekends) ?  ? ? ? ? ? ? ? ? ? ? ? ? ?  ?

## 2021-12-29 DIAGNOSIS — E43 Unspecified severe protein-calorie malnutrition: Secondary | ICD-10-CM | POA: Diagnosis not present

## 2021-12-29 DIAGNOSIS — Z515 Encounter for palliative care: Secondary | ICD-10-CM | POA: Diagnosis not present

## 2021-12-29 DIAGNOSIS — K56609 Unspecified intestinal obstruction, unspecified as to partial versus complete obstruction: Secondary | ICD-10-CM | POA: Diagnosis not present

## 2021-12-29 DIAGNOSIS — G9341 Metabolic encephalopathy: Secondary | ICD-10-CM | POA: Diagnosis not present

## 2021-12-29 DIAGNOSIS — U071 COVID-19: Secondary | ICD-10-CM

## 2021-12-29 DIAGNOSIS — Z7189 Other specified counseling: Secondary | ICD-10-CM | POA: Diagnosis not present

## 2021-12-29 DIAGNOSIS — F0390 Unspecified dementia without behavioral disturbance: Secondary | ICD-10-CM | POA: Diagnosis not present

## 2021-12-29 LAB — CBC WITH DIFFERENTIAL/PLATELET
Abs Immature Granulocytes: 0.02 10*3/uL (ref 0.00–0.07)
Basophils Absolute: 0 10*3/uL (ref 0.0–0.1)
Basophils Relative: 0 %
Eosinophils Absolute: 0 10*3/uL (ref 0.0–0.5)
Eosinophils Relative: 0 %
HCT: 34.5 % — ABNORMAL LOW (ref 36.0–46.0)
Hemoglobin: 11.2 g/dL — ABNORMAL LOW (ref 12.0–15.0)
Immature Granulocytes: 0 %
Lymphocytes Relative: 13 %
Lymphs Abs: 0.7 10*3/uL (ref 0.7–4.0)
MCH: 30.9 pg (ref 26.0–34.0)
MCHC: 32.5 g/dL (ref 30.0–36.0)
MCV: 95.3 fL (ref 80.0–100.0)
Monocytes Absolute: 0.7 10*3/uL (ref 0.1–1.0)
Monocytes Relative: 12 %
Neutro Abs: 4.2 10*3/uL (ref 1.7–7.7)
Neutrophils Relative %: 75 %
Platelets: 190 10*3/uL (ref 150–400)
RBC: 3.62 MIL/uL — ABNORMAL LOW (ref 3.87–5.11)
RDW: 15.5 % (ref 11.5–15.5)
WBC: 5.7 10*3/uL (ref 4.0–10.5)
nRBC: 0 % (ref 0.0–0.2)

## 2021-12-29 LAB — COMPREHENSIVE METABOLIC PANEL
ALT: 16 U/L (ref 0–44)
AST: 25 U/L (ref 15–41)
Albumin: 2.9 g/dL — ABNORMAL LOW (ref 3.5–5.0)
Alkaline Phosphatase: 41 U/L (ref 38–126)
Anion gap: 12 (ref 5–15)
BUN: 29 mg/dL — ABNORMAL HIGH (ref 8–23)
CO2: 24 mmol/L (ref 22–32)
Calcium: 8.4 mg/dL — ABNORMAL LOW (ref 8.9–10.3)
Chloride: 105 mmol/L (ref 98–111)
Creatinine, Ser: 0.61 mg/dL (ref 0.44–1.00)
GFR, Estimated: >60 mL/min (ref >60)
Glucose, Bld: 72 mg/dL (ref 70–99)
Potassium: 2.9 mmol/L — ABNORMAL LOW (ref 3.5–5.1)
Sodium: 141 mmol/L (ref 135–145)
Total Bilirubin: 0.8 mg/dL (ref 0.3–1.2)
Total Protein: 5.2 g/dL — ABNORMAL LOW (ref 6.5–8.1)

## 2021-12-29 LAB — PROCALCITONIN: Procalcitonin: 0.2 ng/mL

## 2021-12-29 LAB — D-DIMER, QUANTITATIVE: D-Dimer, Quant: 1.77 ug/mL-FEU — ABNORMAL HIGH (ref 0.00–0.50)

## 2021-12-29 LAB — C-REACTIVE PROTEIN: CRP: 13.4 mg/dL — ABNORMAL HIGH (ref <1.0)

## 2021-12-29 LAB — MAGNESIUM: Magnesium: 2 mg/dL (ref 1.7–2.4)

## 2021-12-29 MED ORDER — POTASSIUM CHLORIDE IN NACL 40-0.9 MEQ/L-% IV SOLN
INTRAVENOUS | Status: DC
  Filled 2021-12-29 (×3): qty 1000

## 2021-12-29 MED ORDER — POTASSIUM CHLORIDE 10 MEQ/100ML IV SOLN: 10.0000 meq | INTRAVENOUS | Status: DC

## 2021-12-29 MED ORDER — POTASSIUM CHLORIDE 10 MEQ/100ML IV SOLN
10.0000 meq | INTRAVENOUS | Status: AC
  Administered 2021-12-29 (×6): 10 meq via INTRAVENOUS
  Filled 2021-12-29 (×6): qty 100

## 2021-12-29 NOTE — Progress Notes (Signed)
Palliative: ? ?HPI: 81 y.o. female  with past medical history of dementia, hypertension, scoliosis, malnutrition, ileus, sigmoid diuverticulitis, left femoral hernia without obstruction or gangrene admitted on 12/27/2021 with small bowel obstruction and right lower lobe pneumonia. Per surgery note patient and family do not desire surgical intervention but to desire conservative management with knowledge that there is low likelihood of recovery without surgery and that this could become terminal. Palliative consultation requested.   ? ?Ms. Kellen is sleeping peacefully - I did not awaken. Discussed with RN and NT at bedside. They report a lot of pain with any movement. We discussed PRN pain medication that can be given prior to bathing or activity to provide relief. RN to notify me if pain medication not effective and I can adjust. NGT with continued output ~200-300 mL per RN. Ms. Mcghee is tired after having many visitors today.  ? ?I called and spoke with son, [name]. [name] has not spoken with any other providers since our conversation. He reports that she had a busy morning and he is trying to allow her time to rest but plans to return to the hospital again this evening. His main question is if there are any signs of improvement and from what I see there are no changes in abd status. We discussed abd xray tomorrow to better tell if there are any changes. We discussed that since she has tested positive for COVID she will need to wait 10 days before she is eligible to go to [facility] as we discussed. (I did call and verify that 10 days is still the rule to wait). Unfortunately she is from home and returning home in her current condition is not an option. Will continue with conservative treatment and reassess progress vs need for transition to comfort tomorrow. Prognosis remains poor.  ? ?All questions/concerns addressed. Emotional support provided.  ? ?Exam: Sleeping. No distress. Thin, frail. Lying in fetal  position. Scoliosis. Abd distended.  ? ?Plan: ?- DNR.  ?- Continue conservative treatment.  ?- No surgery.  ?- Reassess tomorrow for benefits/risks of continuing conservative management vs transition to comfort.  ?- Unfortunately disposition options extremely limited with COVID+ status.  ? ?25 min ? ?Vinie Sill, NP ?Palliative Medicine Team ?Pager [phone] (Please see [email or web address] for schedule) ?Team Phone [phone]  ? ? ?Greater than 50%  of this time was spent counseling and coordinating care related to the above assessment and plan   ?

## 2021-12-29 NOTE — Progress Notes (Signed)
?PROGRESS NOTE ? ? ? ?Shelly Barrett  TMA:[id number] DOB: [birth year] DOA: 12/27/2021 ?PCP: [name], MD  ? ?Brief Narrative:  ?81 y.o. female with medical history significant of dementia, HTN, scoliosis presented with altered mental status and vomiting.  On presentation, she was found to have high-grade small bowel obstruction at right obturator hernia containing a loop of small bowel with moderate to severe distention of the stomach and proximal and mid small bowel loops, no evidence of pneumoperitoneum with possible right lower lobe pneumonia or aspiration.  NG tube was placed General surgery was consulted.  She was started on IV fluids.  CT of the head was negative for acute intracranial abnormality.  Palliative care was also consulted. ? ?Assessment & Plan: ?  ?Small bowel obstruction from high-grade obstruction from a right obturator hernia ?-Currently being managed conservatively with IV fluids, NG tube, antiemetics and analgesics as needed. ?-Patient/family do not want any surgical intervention: General surgery signed off on 12/28/2021.   ?-Doubt that her condition would improve with conservative management alone.  Palliative care following.  If condition worsens, she would be appropriate for comfort measures/hospice. ? ?Possible right lower lobe pneumonia ?SIRS ?-Currently on cefepime.  Procalcitonin 0.2 ? ?Acute metabolic encephalopathy ?Dementia ?-Possibly from above.  Patient has baseline dementia.  Fall precautions/delirium precautions. ? ?Hypokalemia ?-Replace ? ?COVID-19 infection ?-Patient tested positive for COVID-19.  She is currently being treated for bacterial right lower lobe pneumonia.  ?-Currently intermittently requiring 2 L oxygen via nasal cannula.  We will hold off on starting remdesivir/steroids.   ?-Will not monitor inflammatory markers daily for patient's comfort ? ?Hypertension ?-Blood pressure intermittently on the lower side.  Monitor ? ?Severe protein calorie malnutrition ?-Will  need dietitian consult if her SBO resolves ? ? ?DVT prophylaxis: SCDs ?Code Status: DNR ?Family Communication: Son at bedside on 12/28/2021 ?Disposition Plan: ?Status is: Inpatient ?Remains inpatient appropriate because: Of severity of illness ? ?Consultants: General surgery/palliative care ? ?Procedures: None ? ?Antimicrobials: Cefepime from 12/27/2021 onwards.  Flagyl and vancomycin on 12/27/2021 ? ? ?Subjective: ?Patient seen and examined at bedside.  Poor historian.   Still complains of intermittent abdominal pain with nausea along with nausea.  No fever or agitation reported. ?Objective: ?Vitals:  ? 12/28/21 0800 12/28/21 1427 12/28/21 2025 12/29/21 0351  ?BP:  116/62 (!) 120/54 (!) 109/55  ?Pulse:  (!) 101 (!) 102 91  ?Resp: '19 18 20 20  '$ ?Temp:  98.6 ?F (37 ?C) 97.8 ?F (36.6 ?C) 97.7 ?F (36.5 ?C)  ?TempSrc:  Oral Oral Oral  ?SpO2:  95% 95% 98%  ?Weight:      ?Height:      ? ? ?Intake/Output Summary (Last 24 hours) at 12/29/2021 0815 ?Last data filed at 12/29/2021 0600 ?Gross per 24 hour  ?Intake 2130.86 ml  ?Output 950 ml  ?Net 1180.86 ml  ? ? ?Filed Weights  ? 12/27/21 1852  ?Weight: 38 kg  ? ? ?Examination: ? ?General exam: No distress.  On room air currently.  Currently in no acute distress ?ENT: Still has NG tube ?Respiratory system: Decreased breath sounds at bases bilaterally with some crackles ?Cardiovascular system: Tachycardic intermittently; S1-S2 heard ?gastrointestinal system: Abdomen is still distended; soft and diffusely tender.  Sluggish bowel sounds  ?extremities: Mild lower extremity edema present; no cyanosis  ?Central nervous system: Still confused; awake.  Slow to respond.  Poor historian.  No focal neurological deficits.  Moves extremities ?Skin: No obvious ecchymosis/lesions  ?psychiatry: Currently not agitated; affect is flat ? ? ?  Data Reviewed: I have personally reviewed following labs and imaging studies ? ?CBC: ?Recent Labs  ?Lab 12/27/21 ?1345 12/28/21 ?4270 12/29/21 ?0442  ?WBC 7.4 5.5  5.7  ?NEUTROABS 6.1  --  4.2  ?HGB 15.5* 13.2 11.2*  ?HCT 46.9* 39.9 34.5*  ?MCV 92.1 91.7 95.3  ?PLT 278 200 190  ? ? ?Basic Metabolic Panel: ?Recent Labs  ?Lab 12/27/21 ?1345 12/28/21 ?6237 12/29/21 ?0442  ?NA 139 139 141  ?K 3.0* 3.6 2.9*  ?CL 90* 100 105  ?CO2 35* 29 24  ?GLUCOSE 141* 107* 72  ?BUN 63* 43* 29*  ?CREATININE 0.79 0.56 0.61  ?CALCIUM 10.2 8.7* 8.4*  ?MG 2.4  --  2.0  ? ? ?GFR: ?Estimated Creatinine Clearance: 34.2 mL/min (by C-G formula based on SCr of 0.61 mg/dL). ?Liver Function Tests: ?Recent Labs  ?Lab 12/27/21 ?1345 12/28/21 ?6283 12/29/21 ?0442  ?AST '27 22 25  '$ ?ALT '20 16 16  '$ ?ALKPHOS 64 47 41  ?BILITOT 0.7 0.9 0.8  ?PROT 7.6 5.5* 5.2*  ?ALBUMIN 4.5 3.2* 2.9*  ? ? ?No results for input(s): LIPASE, AMYLASE in the last 168 hours. ?No results for input(s): AMMONIA in the last 168 hours. ?Coagulation Profile: ?Recent Labs  ?Lab 12/27/21 ?1410 12/28/21 ?1517  ?INR 1.1 1.1  ? ? ?Cardiac Enzymes: ?No results for input(s): CKTOTAL, CKMB, CKMBINDEX, TROPONINI in the last 168 hours. ?BNP (last 3 results) ?No results for input(s): PROBNP in the last 8760 hours. ?HbA1C: ?No results for input(s): HGBA1C in the last 72 hours. ?CBG: ?No results for input(s): GLUCAP in the last 168 hours. ?Lipid Profile: ?No results for input(s): CHOL, HDL, LDLCALC, TRIG, CHOLHDL, LDLDIRECT in the last 72 hours. ?Thyroid Function Tests: ?No results for input(s): TSH, T4TOTAL, FREET4, T3FREE, THYROIDAB in the last 72 hours. ?Anemia Panel: ?No results for input(s): VITAMINB12, FOLATE, FERRITIN, TIBC, IRON, RETICCTPCT in the last 72 hours. ?Sepsis Labs: ?Recent Labs  ?Lab 12/27/21 ?1345 12/27/21 ?1610 12/27/21 ?2007 12/27/21 ?2216 12/28/21 ?0715 12/29/21 ?6160  ?PROCALCITON  --   --   --   --  0.10 0.20  ?LATICACIDVEN 3.0* 1.6 2.0* 1.5  --   --   ? ? ? ?Recent Results (from the past 240 hour(s))  ?Culture, blood (routine x 2)     Status: None (Preliminary result)  ? Collection Time: 12/27/21  1:45 PM  ? Specimen: BLOOD  ?Result  Value Ref Range Status  ? Specimen Description   Final  ?  BLOOD SITE NOT SPECIFIED ?Performed at [facility], [place] [address], [place], [place] [address] ?  ? Special Requests   Final  ?  BOTTLES DRAWN AEROBIC AND ANAEROBIC Blood Culture adequate volume ?Performed at [facility], [place] [address] [place], [place] [address] ?  ? Culture   Final  ?  NO GROWTH 2 DAYS ?Performed at [facility], [place] [address], [place], [place] [address] ?  ? Report Status PENDING  Incomplete  ?Resp Panel by RT-PCR (Flu A&B, Covid) Nasopharyngeal Swab     Status: Abnormal  ? Collection Time: 12/27/21 10:00 PM  ? Specimen: Nasopharyngeal Swab  ?Result Value Ref Range Status  ? SARS Coronavirus 2 by RT PCR POSITIVE (A) NEGATIVE Final  ?  Comment: (NOTE) ?SARS-CoV-2 target nucleic acids are DETECTED. ? ?The SARS-CoV-2 RNA is generally detectable in upper respiratory ?specimens during the acute phase of infection. Positive results are ?indicative of the presence of the identified virus, but do not rule ?out bacterial infection  or co-infection with other pathogens not ?detected by the test. Clinical correlation with patient history and ?other diagnostic information is necessary to determine patient ?infection status. The expected result is Negative. ? ?Fact Sheet for Patients: ?EntrepreneurPulse.com.au ? ?Fact Sheet for Healthcare Providers: ?IncredibleEmployment.be ? ?This test is not yet approved or cleared by the [place] FDA and  ?has been authorized for detection and/or diagnosis of SARS-CoV-2 by ?FDA under an Emergency Use Authorization (EUA).  This EUA will ?remain in effect (meaning this test can be used) for the duration of  ?the COVID-19 declaration under Section 564(b)(1) of the [place], 21 ?U.S[place]C. section 360bbb-3(b)(1), unless the authorization is ?terminated or revoked sooner. ? ?  ? Influenza A by PCR NEGATIVE NEGATIVE Final  ? Influenza B by PCR  NEGATIVE NEGATIVE Final  ?  Comment: (NOTE) ?The Xpert Xpress SARS-CoV-2/FLU/RSV plus assay is intended as an aid ?in the diagnosis of influenza from Nasopharyngeal swab specimens and ?should not be u

## 2021-12-30 ENCOUNTER — Encounter (HOSPITAL_COMMUNITY): Payer: Self-pay | Admitting: Internal Medicine

## 2021-12-30 ENCOUNTER — Inpatient Hospital Stay (HOSPITAL_COMMUNITY): Payer: PPO

## 2021-12-30 ENCOUNTER — Other Ambulatory Visit: Payer: Self-pay

## 2021-12-30 DIAGNOSIS — F0390 Unspecified dementia without behavioral disturbance: Secondary | ICD-10-CM | POA: Diagnosis not present

## 2021-12-30 DIAGNOSIS — K56609 Unspecified intestinal obstruction, unspecified as to partial versus complete obstruction: Secondary | ICD-10-CM | POA: Diagnosis not present

## 2021-12-30 DIAGNOSIS — Z515 Encounter for palliative care: Secondary | ICD-10-CM | POA: Diagnosis not present

## 2021-12-30 DIAGNOSIS — G9341 Metabolic encephalopathy: Secondary | ICD-10-CM | POA: Diagnosis not present

## 2021-12-30 DIAGNOSIS — E872 Acidosis, unspecified: Secondary | ICD-10-CM

## 2021-12-30 DIAGNOSIS — U071 COVID-19: Secondary | ICD-10-CM | POA: Diagnosis not present

## 2021-12-30 DIAGNOSIS — Z7189 Other specified counseling: Secondary | ICD-10-CM | POA: Diagnosis not present

## 2021-12-30 MED ORDER — MORPHINE SULFATE (PF) 2 MG/ML IV SOLN
1.0000 mg | INTRAVENOUS | Status: DC
  Administered 2021-12-30 – 2022-01-05 (×30): 1 mg via INTRAVENOUS
  Filled 2021-12-30 (×31): qty 1

## 2021-12-30 MED ORDER — BIOTENE DRY MOUTH MT LIQD: 15.0000 mL | OROMUCOSAL | Status: DC | PRN

## 2021-12-30 MED ORDER — MORPHINE SULFATE (PF) 2 MG/ML IV SOLN
1.0000 mg | INTRAVENOUS | Status: DC | PRN
  Administered 2022-01-08: 2 mg via INTRAVENOUS
  Filled 2021-12-30: qty 1

## 2021-12-30 MED ORDER — LORAZEPAM 2 MG/ML IJ SOLN
0.5000 mg | INTRAMUSCULAR | Status: DC | PRN
  Administered 2021-12-30 – 2022-01-05 (×4): 0.5 mg via INTRAVENOUS
  Filled 2021-12-30 (×4): qty 1

## 2021-12-30 MED ORDER — GLYCOPYRROLATE 0.2 MG/ML IJ SOLN: 0.6000 mg | INTRAMUSCULAR | Status: DC | PRN

## 2021-12-30 MED ORDER — POLYVINYL ALCOHOL 1.4 % OP SOLN
1.0000 [drp] | Freq: Four times a day (QID) | OPHTHALMIC | Status: DC | PRN
  Filled 2021-12-30: qty 15

## 2021-12-30 NOTE — Progress Notes (Addendum)
?PROGRESS NOTE ? ? ? ?Shelly Barrett  DJS:[id number] DOB: 10-22-1940 DOA: 12/27/2021 ?PCP: [name], MD  ? ?Brief Narrative:  ?81 y.o. female with medical history significant of dementia, HTN, scoliosis presented with altered mental status and vomiting.  On presentation, she was found to have high-grade small bowel obstruction at right obturator hernia containing a loop of small bowel with moderate to severe distention of the stomach and proximal and mid small bowel loops, no evidence of pneumoperitoneum with possible right lower lobe pneumonia or aspiration.  NG tube was placed General surgery was consulted.  She was started on IV fluids.  CT of the head was negative for acute intracranial abnormality.  Patient refused surgical intervention.  General surgery has signed off.  Palliative care was consulted. ? ?Assessment & Plan: ?  ?Small bowel obstruction from high-grade obstruction from a right obturator hernia ?-Currently being managed conservatively with IV fluids, NG tube, antiemetics and analgesics as needed. ?-Patient/family do not want any surgical intervention: General surgery signed off on 12/28/2021.   ?-Doubt that her condition would improve with conservative management alone.  Palliative care following.  X-ray of abdomen pending for today.  If condition worsens, she would be appropriate for comfort measures/hospice. ? ?Lactic acidosis ?-improved with iv fluids ? ?Possible right lower lobe pneumonia ?SIRS ?-Currently on cefepime.  Procalcitonin 0.2 ? ?Acute metabolic encephalopathy ?Dementia ?-Possibly from above.  Patient has baseline dementia.  Fall precautions/delirium precautions. ? ?Hypokalemia ?-Replaced.  Did not repeat any labs for today trying to focus on comfort. ? ?COVID-19 infection ?-Patient tested positive for COVID-19.  She is currently being treated for bacterial right lower lobe pneumonia.  ?-Currently intermittently requiring 2 L oxygen via nasal cannula.  We will hold off on  starting remdesivir/steroids.   ?-Will not monitor inflammatory markers daily for patient's comfort ? ?Hypertension ?-Blood pressure intermittently on the lower side.  Monitor ? ?Severe protein calorie malnutrition ?-Will need dietitian consult if her SBO resolves ? ? ?DVT prophylaxis: SCDs ?Code Status: DNR ?Family Communication: Son and daughter-in-law at bedside on 12/30/2021 ?Disposition Plan: ?Status is: Inpatient ?Remains inpatient appropriate because: Of severity of illness ? ?Consultants: General surgery/palliative care ? ?Procedures: None ? ?Antimicrobials: Cefepime from 12/27/2021 onwards.  Flagyl and vancomycin on 12/27/2021 ? ? ?Subjective: ?Patient seen and examined at bedside.  Poor historian.   Complains of intermittent back and abdominal pain with some nausea.  No overnight agitation or fever reported. ?Objective: ?Vitals:  ? 12/29/21 0351 12/29/21 1233 12/29/21 2003 12/30/21 0559  ?BP: (!) 109/55 (!) 125/55 121/63 (!) 146/70  ?Pulse: 91 92 92 67  ?Resp: '20 20 20 20  '$ ?Temp: 97.7 ?F (36.5 ?C) 97.9 ?F (36.6 ?C) 98.8 ?F (37.1 ?C) 97.8 ?F (36.6 ?C)  ?TempSrc: Oral Oral Oral Oral  ?SpO2: 98% 97% 95% 97%  ?Weight:      ?Height:      ? ? ?Intake/Output Summary (Last 24 hours) at 12/30/2021 0734 ?Last data filed at 12/30/2021 0600 ?Gross per 24 hour  ?Intake 1981.06 ml  ?Output --  ?Net 1981.06 ml  ? ? ?Filed Weights  ? 12/27/21 1852  ?Weight: 38 kg  ? ? ?Examination: ? ?General exam: Currently on room air.  No acute distress.  Looks chronically ill and deconditioned.  Poor historian.  Flat affect.  Slow to respond. ?ENT: NG tube present.   ?Respiratory system: Bilateral decreased breath sounds at bases with scattered crackles  ?cardiovascular system: S1-S2 heard; currently rate controlled  ?gastrointestinal system: Abdomen is  distended more today; soft and diffusely tender.  Bowel sounds are sluggish ?extremities: No clubbing; trace lower extremity edema present ? ?Data Reviewed: I have personally reviewed  following labs and imaging studies ? ?CBC: ?Recent Labs  ?Lab 12/27/21 ?1345 12/28/21 ?9242 12/29/21 ?0442  ?WBC 7.4 5.5 5.7  ?NEUTROABS 6.1  --  4.2  ?HGB 15.5* 13.2 11.2*  ?HCT 46.9* 39.9 34.5*  ?MCV 92.1 91.7 95.3  ?PLT 278 200 190  ? ? ?Basic Metabolic Panel: ?Recent Labs  ?Lab 12/27/21 ?1345 12/28/21 ?6834 12/29/21 ?0442  ?NA 139 139 141  ?K 3.0* 3.6 2.9*  ?CL 90* 100 105  ?CO2 35* 29 24  ?GLUCOSE 141* 107* 72  ?BUN 63* 43* 29*  ?CREATININE 0.79 0.56 0.61  ?CALCIUM 10.2 8.7* 8.4*  ?MG 2.4  --  2.0  ? ? ?GFR: ?Estimated Creatinine Clearance: 34.2 mL/min (by C-G formula based on SCr of 0.61 mg/dL). ?Liver Function Tests: ?Recent Labs  ?Lab 12/27/21 ?1345 12/28/21 ?1962 12/29/21 ?0442  ?AST '27 22 25  '$ ?ALT '20 16 16  '$ ?ALKPHOS 64 47 41  ?BILITOT 0.7 0.9 0.8  ?PROT 7.6 5.5* 5.2*  ?ALBUMIN 4.5 3.2* 2.9*  ? ? ?No results for input(s): LIPASE, AMYLASE in the last 168 hours. ?No results for input(s): AMMONIA in the last 168 hours. ?Coagulation Profile: ?Recent Labs  ?Lab 12/27/21 ?1410 12/28/21 ?2297  ?INR 1.1 1.1  ? ? ?Cardiac Enzymes: ?No results for input(s): CKTOTAL, CKMB, CKMBINDEX, TROPONINI in the last 168 hours. ?BNP (last 3 results) ?No results for input(s): PROBNP in the last 8760 hours. ?HbA1C: ?No results for input(s): HGBA1C in the last 72 hours. ?CBG: ?No results for input(s): GLUCAP in the last 168 hours. ?Lipid Profile: ?No results for input(s): CHOL, HDL, LDLCALC, TRIG, CHOLHDL, LDLDIRECT in the last 72 hours. ?Thyroid Function Tests: ?No results for input(s): TSH, T4TOTAL, FREET4, T3FREE, THYROIDAB in the last 72 hours. ?Anemia Panel: ?No results for input(s): VITAMINB12, FOLATE, FERRITIN, TIBC, IRON, RETICCTPCT in the last 72 hours. ?Sepsis Labs: ?Recent Labs  ?Lab 12/27/21 ?1345 12/27/21 ?1610 12/27/21 ?2007 12/27/21 ?2216 12/28/21 ?0715 12/29/21 ?9892  ?PROCALCITON  --   --   --   --  0.10 0.20  ?LATICACIDVEN 3.0* 1.6 2.0* 1.5  --   --   ? ? ? ?Recent Results (from the past 240 hour(s))  ?Culture,  blood (routine x 2)     Status: None (Preliminary result)  ? Collection Time: 12/27/21  1:45 PM  ? Specimen: BLOOD  ?Result Value Ref Range Status  ? Specimen Description   Final  ?  BLOOD SITE NOT SPECIFIED ?Performed at [facility], [place] [address], [place], [place] [address] ?  ? Special Requests   Final  ?  BOTTLES DRAWN AEROBIC AND ANAEROBIC Blood Culture adequate volume ?Performed at [facility], [place] [address], [place], [place] [address] ?  ? Culture   Final  ?  NO GROWTH 3 DAYS ?Performed at [facility], [place] [address], [place], [place] [address] ?  ? Report Status PENDING  Incomplete  ?Resp Panel by RT-PCR (Flu A&B, Covid) Nasopharyngeal Swab     Status: Abnormal  ? Collection Time: 12/27/21 10:00 PM  ? Specimen: Nasopharyngeal Swab  ?Result Value Ref Range Status  ? SARS Coronavirus 2 by RT PCR POSITIVE (A) NEGATIVE Final  ?  Comment: (NOTE) ?SARS-CoV-2 target nucleic acids are DETECTED. ? ?The SARS-CoV-2 RNA is generally detectable in upper respiratory ?specimens during the acute  phase of infection. Positive results are ?indicative of the presence of the identified virus, but do not rule ?out bacterial infection or co-infection with other pathogens not ?detected by the test. Clinical correlation with patient history and ?other diagnostic information is necessary to determine patient ?infection status. The expected result is Negative. ? ?Fact Sheet for Patients: ?EntrepreneurPulse.com.au ? ?Fact Sheet for Healthcare Providers: ?IncredibleEmployment.be ? ?This test is not yet approved or cleared by the [place] FDA and  ?has been authorized for detection and/or diagnosis of SARS-CoV-2 by ?FDA under an Emergency Use Authorization (EUA).  This EUA will ?remain in effect (meaning this test can be used) for the duration of  ?the COVID-19 declaration under Section 564(b)(1) of the A ct, 21 ?U.S.C. section 360bbb-3(b)(1), unless  the authorization is ?terminated or revoked sooner. ? ?  ? Influenza A by PCR NEGATIVE NEGATIVE Final  ? Influenza B by PCR NEGATIVE NEGATIVE Final  ?  Comment: (NOTE) ?The Xpert Xpress SARS-CoV-2/FLU/RSV plus assa

## 2021-12-30 NOTE — Progress Notes (Signed)
Palliative: ? ?HPI: 81 y.o. female  with past medical history of dementia, hypertension, scoliosis, malnutrition, ileus, sigmoid diuverticulitis, left femoral hernia without obstruction or gangrene admitted on 12/27/2021 with small bowel obstruction and right lower lobe pneumonia. Per surgery note patient and family do not desire surgical intervention but to desire conservative management with knowledge that there is low likelihood of recovery without surgery and that this could become terminal. Palliative consultation requested.    ? ?I met today at Shelly Barrett's bedside along with her son and daughter-in-law. Shelly Barrett is still alert and oriented but tired and telling us that she doesn't want to be moved because it hurts. Her abd is more tight and distended today. She continues with bilious output and drainage. Her pain seems worse overall today. After discussion about these signs everyone agreed to cancel abd xray as this will cause her more discomfort. We agreed to move forward with full comfort care. Discussed scheduled pain medication, PRN comfort medications, d/c IVF, and d/c antibiotics with them. Goal is for full comfort care. Unfortunately not appropriate for [facility] until 10 days COVID isolation completed. Discussed with son that she may not live that long. She could have further significant decline with transition to full comfort care today. The option of transition to [place] was mentioned but he would like to wait and make sure a move that far makes sense. Will reassess tomorrow and see if she is stable but if having more significant decline she would be better served by continued comfort care and in hospital death.  ? ?All questions/concerns addressed. Emotional support provided. Discussed with RN, CSW, Dr. Starla Link.  ? ?Exam: Alert, some confusion but appropriate responses in conversation most of the time. No distress. Pain all over. Scoliosis. Thin, frail. Breathing regular,  unlabored. Abd distended and tight - worse than previous days.  ? ?Plan: ?- Full comfort care.  ?- Scheduled and PRN medications available to ensure comfort. Increase dosage/frequency as needed to achieve comfort and relief.  ?- Very likely for in hospital death but can consider transition to hospice over the next couple days.  ? ?50 min ? ?Vinie Sill, NP ?Palliative Medicine Team ?Pager [phone] (Please see [email or web address] for schedule) ?Team Phone [phone]  ? ? ?Greater than 50%  of this time was spent counseling and coordinating care related to the above assessment and plan   ?

## 2021-12-31 DIAGNOSIS — Z515 Encounter for palliative care: Secondary | ICD-10-CM | POA: Diagnosis not present

## 2021-12-31 DIAGNOSIS — K56609 Unspecified intestinal obstruction, unspecified as to partial versus complete obstruction: Secondary | ICD-10-CM | POA: Diagnosis not present

## 2021-12-31 MED ORDER — CHLORHEXIDINE GLUCONATE CLOTH 2 % EX PADS
6.0000 | MEDICATED_PAD | Freq: Every day | CUTANEOUS | Status: DC
  Administered 2021-12-31 – 2022-01-05 (×6): 6 via TOPICAL

## 2021-12-31 NOTE — Progress Notes (Signed)
?PROGRESS NOTE ? ? ? ?Shelly Barrett  GYF:[id number] DOB: 04-18-1941 DOA: 12/27/2021 ?PCP: [name], MD  ? ?Brief Narrative:  ?81 y.o. female with medical history significant of dementia, HTN, scoliosis presented with altered mental status and vomiting.  On presentation, she was found to have high-grade small bowel obstruction at right obturator hernia containing a loop of small bowel with moderate to severe distention of the stomach and proximal and mid small bowel loops, no evidence of pneumoperitoneum with possible right lower lobe pneumonia or aspiration.  NG tube was placed General surgery was consulted.  She was started on IV fluids.  CT of the head was negative for acute intracranial abnormality.  Patient refused surgical intervention.  General surgery has signed off.  Palliative care was consulted.  She was switched to full comfort measures on 12/30/2021. ? ?Assessment & Plan: ?  ?Comfort measures only status comfort measures only  ?small bowel obstruction from high-grade obstruction from a right obturator hernia ?Lactic acidosis ?Possible right lower lobe pneumonia ?SIRS ?Acute metabolic encephalopathy ?Dementia ?Hypokalemia ?COVID-19 infection ?Hypertension ?Severe protein calorie malnutrition ? ?Plan ?-As per above discussion, she was switched to full comfort measures on 12/30/2021.  Family interested in residential hospice.  She apparently pulled out her NG tube last night: NG tube will not be replaced for comfort. ? ?DVT prophylaxis: None for comfort measures  ?code Status: DNR ?Family Communication: Son at bedside on 12/30/2021 ?Disposition Plan: ?Status is: Inpatient ?Remains inpatient appropriate because: Need for residential hospice placement  ?consultants: General surgery/palliative care ? ?Procedures: None ? ?Antimicrobials: Antibiotics have been discontinued ? ?Subjective: ?Patient seen and examined at bedside.  Poor historian.   Son states that patient slept better last night.  Patient  apparently pulled her NG tube out last night.   ?Objective: ?Vitals:  ? 12/29/21 2003 12/30/21 0559 12/30/21 2018 12/31/21 0535  ?BP: 121/63 (!) 146/70 113/60 127/65  ?Pulse: 92 67 (!) 113 96  ?Resp: '20 20 18 16  '$ ?Temp: 98.8 ?F (37.1 ?C) 97.8 ?F (36.6 ?C) 98.4 ?F (36.9 ?C) 98.2 ?F (36.8 ?C)  ?TempSrc: Oral Oral Oral Oral  ?SpO2: 95% 97%  (!) 80%  ?Weight:      ?Height:      ? ? ?Intake/Output Summary (Last 24 hours) at 12/31/2021 0734 ?Last data filed at 12/31/2021 0100 ?Gross per 24 hour  ?Intake 118 ml  ?Output 2500 ml  ?Net -2382 ml  ? ? ?Filed Weights  ? 12/27/21 1852  ?Weight: 38 kg  ? ? ?Examination: ? ?General exam: No distress.  On 2 L oxygen via nasal cannula intermittently.  Looks chronically ill and deconditioned.  Poor historian.  Flat affect.  Slow to respond.  NG tube has been removed. ? ?Data Reviewed: I have personally reviewed following labs and imaging studies ? ?CBC: ?Recent Labs  ?Lab 12/27/21 ?1345 12/28/21 ?9163 12/29/21 ?0442  ?WBC 7.4 5.5 5.7  ?NEUTROABS 6.1  --  4.2  ?HGB 15.5* 13.2 11.2*  ?HCT 46.9* 39.9 34.5*  ?MCV 92.1 91.7 95.3  ?PLT 278 200 190  ? ? ?Basic Metabolic Panel: ?Recent Labs  ?Lab 12/27/21 ?1345 12/28/21 ?8466 12/29/21 ?0442  ?NA 139 139 141  ?K 3.0* 3.6 2.9*  ?CL 90* 100 105  ?CO2 35* 29 24  ?GLUCOSE 141* 107* 72  ?BUN 63* 43* 29*  ?CREATININE 0.79 0.56 0.61  ?CALCIUM 10.2 8.7* 8.4*  ?MG 2.4  --  2.0  ? ? ?GFR: ?Estimated Creatinine Clearance: 34.2 mL/min (by C-G formula based  on SCr of 0.61 mg/dL). ?Liver Function Tests: ?Recent Labs  ?Lab 12/27/21 ?1345 12/28/21 ?3267 12/29/21 ?0442  ?AST '27 22 25  '$ ?ALT '20 16 16  '$ ?ALKPHOS 64 47 41  ?BILITOT 0.7 0.9 0.8  ?PROT 7.6 5.5* 5.2*  ?ALBUMIN 4.5 3.2* 2.9*  ? ? ?No results for input(s): LIPASE, AMYLASE in the last 168 hours. ?No results for input(s): AMMONIA in the last 168 hours. ?Coagulation Profile: ?Recent Labs  ?Lab 12/27/21 ?1410 12/28/21 ?1245  ?INR 1.1 1.1  ? ? ?Cardiac Enzymes: ?No results for input(s): CKTOTAL, CKMB,  CKMBINDEX, TROPONINI in the last 168 hours. ?BNP (last 3 results) ?No results for input(s): PROBNP in the last 8760 hours. ?HbA1C: ?No results for input(s): HGBA1C in the last 72 hours. ?CBG: ?No results for input(s): GLUCAP in the last 168 hours. ?Lipid Profile: ?No results for input(s): CHOL, HDL, LDLCALC, TRIG, CHOLHDL, LDLDIRECT in the last 72 hours. ?Thyroid Function Tests: ?No results for input(s): TSH, T4TOTAL, FREET4, T3FREE, THYROIDAB in the last 72 hours. ?Anemia Panel: ?No results for input(s): VITAMINB12, FOLATE, FERRITIN, TIBC, IRON, RETICCTPCT in the last 72 hours. ?Sepsis Labs: ?Recent Labs  ?Lab 12/27/21 ?1345 12/27/21 ?1610 12/27/21 ?2007 12/27/21 ?2216 12/28/21 ?0715 12/29/21 ?8099  ?PROCALCITON  --   --   --   --  0.10 0.20  ?LATICACIDVEN 3.0* 1.6 2.0* 1.5  --   --   ? ? ? ?Recent Results (from the past 240 hour(s))  ?Culture, blood (routine x 2)     Status: None (Preliminary result)  ? Collection Time: 12/27/21  1:45 PM  ? Specimen: BLOOD  ?Result Value Ref Range Status  ? Specimen Description   Final  ?  BLOOD SITE NOT SPECIFIED ?Performed at [facility], [place] [address], [place], [place] [address] ?  ? Special Requests   Final  ?  BOTTLES DRAWN AEROBIC AND ANAEROBIC Blood Culture adequate volume ?Performed at [facility], [place] [address], [place], [place] [address] ?  ? Culture   Final  ?  NO GROWTH 4 DAYS ?Performed at [facility], [place] [address], [place], [place] [address] ?  ? Report Status PENDING  Incomplete  ?Resp Panel by RT-PCR (Flu A&B, Covid) Nasopharyngeal Swab     Status: Abnormal  ? Collection Time: 12/27/21 10:00 PM  ? Specimen: Nasopharyngeal Swab  ?Result Value Ref Range Status  ? SARS Coronavirus 2 by RT PCR POSITIVE (A) NEGATIVE Final  ?  Comment: (NOTE) ?SARS-CoV-2 target nucleic acids are DETECTED. ? ?The SARS-CoV-2 RNA is generally detectable in upper respiratory ?specimens during the acute phase of infection. Positive results  are ?indicative of the presence of the identified virus, but do not rule ?out bacterial infection or co-infection with other pathogens not ?detected by the test. Clinical correlation with patient history and ?other diagnostic information is necessary to determine patient ?infection status. The expected result is Negative. ? ?Fact Sheet for Patients: ?EntrepreneurPulse.com.au ? ?Fact Sheet for Healthcare Providers: ?IncredibleEmployment.be ? ?This test is not yet approved or cleared by the [place] FDA and  ?has been authorized for detection and/or diagnosis of SARS-CoV-2 by ?FDA under an Emergency Use Authorization (EUA).  This EUA will ?remain in effect (meaning this test can be used) for the duration of  ?the COVID-19 declaration under Section 564(b)(1) of the A ct, 21 ?U.S.C. section 360bbb-3(b)(1), unless the authorization is ?terminated or revoked sooner. ? ?  ? Influenza A by PCR NEGATIVE NEGATIVE Final  ? Influenza  B by PCR NEGATIVE NEGATIVE Final  ?  Comment: (NOTE) ?The Xpert Xpress SARS-CoV-2/FLU/RSV plus assay is intended as an aid ?in the diagnosis of influenza from Nasopharyngeal swab specimens and ?should not be used as a sole basis for treatment. Nasal washings and ?aspirates are unacceptable for Xpert Xpress SARS-CoV-2/FLU/RSV ?testing. ? ?Fact Sheet for Patients: ?EntrepreneurPulse.com.au ? ?Fact Sheet for Healthcare Providers: ?IncredibleEmployment.be ? ?This test is not yet approved or cleared by the [place] FDA and ?has been authorized for detection and/or diagnosis of SARS-CoV-2 by ?FDA under an Emergency Use Authorization (EUA). This EUA will remain ?in effect (meaning this test can be used) for the duration of the ?COVID-19 declaration under Section 564(b)(1) of the Act, 21 U.S.C. ?section 360bbb-3(b)(1), unless the authorization is terminated or ?revoked. ? ?Performed at [facility], [place]  [address] ?[place], [place] [address] ?  ?Culture, blood (routine x 2)     Status: None (Preliminary result)  ? Collection Time: 12/28/21  7:14 AM  ? Specimen: BLOOD  ?Result Value Ref Range Status  ? Specimen Descripti

## 2021-12-31 NOTE — Progress Notes (Signed)
Patient Shelly Barrett      DOB: 08-21-1941      VZC:[id number] ? ? ? ?  ?Palliative Medicine Team ? ? ? ?Subjective: Bedside symptom check. ? ? ?Physical exam: Patient being cleaned up by two bedside staff at time of visit. Son Shelly Barrett and granddaughter Shelly Barrett in room visiting also. Patient denies pain or discomfort at this time, only when staff are actively moving her. She is concerned about the swelling of her right forearm and hand from an old IV that went bad but agrees it is looking better from yesterday. Breathing is even and non-labored, no excessive secretions noted at this time. The patient did not show any physical or nonverbal signs of pain or discomfort while this RN was bedside. Patient held pleasant conversation but was very limited in sharing.  ? ? ?Assessment and plan: Son shares with me that they are comfortable with her care here in the hospital and with her recent positive covid test, they would prefer to keep her here rather than transfer farther away to accommodate her. He also shares that she is a very strong woman and that she may not share with staff that she is experiencing pain when she indeed is. This RN also shared with family that if the patient did not want to wear oxygen, she was able to remove the cannula anytime. She has one remaining IV access and this RN shared with them that there are many sublingual options for medications should that IV access be lost, a new one would not be required to ensure comfort. They were in agreement with all of this. After concluding my bedside visit, this RN connected with the staff RN assigned to her today. This RN shared this information and encouraged her or the family to call the PMT phone with any needs or concerns, as she didn't have any this morning. Will continue to follow for changes or advances.  ? ? ?Thank you for allowing the Palliative Medicine Team to assist in the care of this patient. ?  ?  ?Damian Leavell, MSN, RN ?Palliative Medicine  Team ?Team Phone: [phone]  ?This phone is monitored 7a-7p, please reach out to attending physician outside of these hours for urgent needs.   ?

## 2021-12-31 NOTE — Progress Notes (Signed)
Patient removed NG.  MD made aware and advised for no new placement.  ?Patient in bed resting no signs of distress noted. Safety measures in place, bed locked armed and in lowest level, remote and call light within reach. No additional needs at this time. Will continue to monitor.  ? ? ?

## 2022-01-01 DIAGNOSIS — Z7189 Other specified counseling: Secondary | ICD-10-CM | POA: Diagnosis not present

## 2022-01-01 DIAGNOSIS — Z515 Encounter for palliative care: Secondary | ICD-10-CM | POA: Diagnosis not present

## 2022-01-01 DIAGNOSIS — K56609 Unspecified intestinal obstruction, unspecified as to partial versus complete obstruction: Secondary | ICD-10-CM | POA: Diagnosis not present

## 2022-01-01 DIAGNOSIS — F0390 Unspecified dementia without behavioral disturbance: Secondary | ICD-10-CM | POA: Diagnosis not present

## 2022-01-01 DIAGNOSIS — U071 COVID-19: Secondary | ICD-10-CM | POA: Diagnosis not present

## 2022-01-01 LAB — CULTURE, BLOOD (ROUTINE X 2)
Culture: NO GROWTH
Special Requests: ADEQUATE

## 2022-01-01 NOTE — Progress Notes (Signed)
Patient EO:Shelly Barrett      DOB: [birth year]      XJO:[id number] ? ? ? ?  ?Palliative Medicine Team ? ? ? ?Subjective: Bedside symptom check. No visitors or family present at time of visit.  ? ? ?Physical exam: Patient comfortably resting in bed with eyes open at start of shift. Patient wearing her glasses and immediately tracks this RN when entered room. Breathing even and non-labored, no excessive secretions noted at this time. Patient only endorses pain with repositioning or being cleaned up. Denies any pain or discomfort today while at rest during this visit. Swelling in right hand/forearm appears improved form yesterday, the patient does not mention this today. ? ? ?Assessment and plan: Patient mostly maintains eye contact during this visit but a few times drifted off with eyes closed while this RN waited for patient to answer question. Patient notably more alert than yesterday but still waxes and wanes throughout conversation. Patient more participatory in conversations and mentions that she remembered me from yesterday and that her son just left before my visit. Bedside staff denies any needs or concerns at this time. This RN spoke with the patient's son regarding slight improvement in status today and agrees with some improvement, but unsure whether this improvement is long lasting. Will continue to follow up with PMT through weekend and touch base with son, [name] after seeing how she does/maintains. Will follow for any needs or advances at this time.  ? ? ?Thank you for allowing the Palliative Medicine Team to assist in the care of this patient. ?  ?  ?Damian Leavell, MSN, RN ?Palliative Medicine Team ?Team Phone: [phone]  ?This phone is monitored 7a-7p, please reach out to attending physician outside of these hours for urgent needs.   ?

## 2022-01-01 NOTE — Progress Notes (Signed)
2145 ?Patient refused meds, stating she just wants to eat ice chips. Patient provide with ice chips. Patient in bed resting no signs of distress noted. Safety measures in place, bed locked armed and in lowest level, remote and call light within reach. No additional needs at this time, will continue to monitor.  ? ?0200 ?Patient refused meds, and requested ice chips. Patient assisted with eating ice chips. Patient remains pleasant in bed eating ice chips and expressed concerns for needed IV fluids, ability to lift legs and discoloration of her arms. She request scrambled eggs and orange juice for breakfast.  ?Patient in bed resting, no signs of distress noted. Safety measures in place, bed locked armed and in lowest level, remote and call light within reach. No additional needs at this time, will continue to monitor.  ?

## 2022-01-01 NOTE — Progress Notes (Signed)
?PROGRESS NOTE ? ? ? ?Shelly Barrett  XBM:[id number] DOB: 1941-07-07 DOA: 12/27/2021 ?PCP: [name], MD  ? ?Brief Narrative:  ?81 y.o. female with medical history significant of dementia, HTN, scoliosis presented with altered mental status and vomiting.  On presentation, she was found to have high-grade small bowel obstruction at right obturator hernia containing a loop of small bowel with moderate to severe distention of the stomach and proximal and mid small bowel loops, no evidence of pneumoperitoneum with possible right lower lobe pneumonia or aspiration.  NG tube was placed General surgery was consulted.  She was started on IV fluids.  CT of the head was negative for acute intracranial abnormality.  Patient refused surgical intervention.  General surgery has signed off.  Palliative care was consulted.  She was switched to full comfort measures on 12/30/2021. ? ?Assessment & Plan: ?  ?Comfort measures only status comfort measures only  ?small bowel obstruction from high-grade obstruction from a right obturator hernia ?Lactic acidosis ?Possible right lower lobe pneumonia ?SIRS ?Acute metabolic encephalopathy ?Dementia ?Hypokalemia ?COVID-19 infection ?Hypertension ?Severe protein calorie malnutrition ? ?Plan ?-As per above discussion, she was switched to full comfort measures on 12/30/2021.  Family interested in residential hospice at [place].  Patient might need to finish 10-day course of isolation prior to placement at [facility]. ? ?DVT prophylaxis: None for comfort measures  ?code Status: DNR ?Family Communication: Son at bedside on 12/31/2021 ?Disposition Plan: ?Status is: Inpatient ?Remains inpatient appropriate because: Need for residential hospice placement  ?consultants: General surgery/palliative care ? ?Procedures: None ? ?Antimicrobials: Antibiotics have been discontinued ? ?Subjective: ?Patient seen and examined at bedside.  Poor historian.  No overnight fever or vomiting reported.  Wants to  eat scrambled eggs.  ? Objective: ?Vitals:  ? 12/30/21 2018 12/31/21 0535 12/31/21 1510 12/31/21 2213  ?BP: 113/60 127/65 112/62 121/62  ?Pulse: (!) 113 96 86 92  ?Resp: '18 16 20 20  '$ ?Temp: 98.4 ?F (36.9 ?C) 98.2 ?F (36.8 ?C) 98.5 ?F (36.9 ?C) 98.9 ?F (37.2 ?C)  ?TempSrc: Oral Oral Oral Oral  ?SpO2:  (!) 80% (!) 85% (!) 85%  ?Weight:      ?Height:      ? ? ?Intake/Output Summary (Last 24 hours) at 01/01/2022 0733 ?Last data filed at 01/01/2022 0531 ?Gross per 24 hour  ?Intake --  ?Output 950 ml  ?Net -950 ml  ? ? ?Filed Weights  ? 12/27/21 1852  ?Weight: 38 kg  ? ? ?Examination: ? ?General exam: Awake, in no distress.  Intermittently requiring supplemental oxygen.  Looks chronically ill and deconditioned.  Poor historian.  Flat affect.  Still slow to respond. ?Data Reviewed: I have personally reviewed following labs and imaging studies ? ?CBC: ?Recent Labs  ?Lab 12/27/21 ?1345 12/28/21 ?0272 12/29/21 ?0442  ?WBC 7.4 5.5 5.7  ?NEUTROABS 6.1  --  4.2  ?HGB 15.5* 13.2 11.2*  ?HCT 46.9* 39.9 34.5*  ?MCV 92.1 91.7 95.3  ?PLT 278 200 190  ? ? ?Basic Metabolic Panel: ?Recent Labs  ?Lab 12/27/21 ?1345 12/28/21 ?5366 12/29/21 ?0442  ?NA 139 139 141  ?K 3.0* 3.6 2.9*  ?CL 90* 100 105  ?CO2 35* 29 24  ?GLUCOSE 141* 107* 72  ?BUN 63* 43* 29*  ?CREATININE 0.79 0.56 0.61  ?CALCIUM 10.2 8.7* 8.4*  ?MG 2.4  --  2.0  ? ? ?GFR: ?Estimated Creatinine Clearance: 34.2 mL/min (by C-G formula based on SCr of 0.61 mg/dL). ?Liver Function Tests: ?Recent Labs  ?Lab 12/27/21 ?[place]  12/28/21 ?7654 12/29/21 ?0442  ?AST '27 22 25  '$ ?ALT '20 16 16  '$ ?ALKPHOS 64 47 41  ?BILITOT 0.7 0.9 0.8  ?PROT 7.6 5.5* 5.2*  ?ALBUMIN 4.5 3.2* 2.9*  ? ? ?No results for input(s): LIPASE, AMYLASE in the last 168 hours. ?No results for input(s): AMMONIA in the last 168 hours. ?Coagulation Profile: ?Recent Labs  ?Lab 12/27/21 ?1410 12/28/21 ?6503  ?INR 1.1 1.1  ? ? ?Cardiac Enzymes: ?No results for input(s): CKTOTAL, CKMB, CKMBINDEX, TROPONINI in the last 168 hours. ?BNP  (last 3 results) ?No results for input(s): PROBNP in the last 8760 hours. ?HbA1C: ?No results for input(s): HGBA1C in the last 72 hours. ?CBG: ?No results for input(s): GLUCAP in the last 168 hours. ?Lipid Profile: ?No results for input(s): CHOL, HDL, LDLCALC, TRIG, CHOLHDL, LDLDIRECT in the last 72 hours. ?Thyroid Function Tests: ?No results for input(s): TSH, T4TOTAL, FREET4, T3FREE, THYROIDAB in the last 72 hours. ?Anemia Panel: ?No results for input(s): VITAMINB12, FOLATE, FERRITIN, TIBC, IRON, RETICCTPCT in the last 72 hours. ?Sepsis Labs: ?Recent Labs  ?Lab 12/27/21 ?1345 12/27/21 ?1610 12/27/21 ?2007 12/27/21 ?2216 12/28/21 ?0715 12/29/21 ?5465  ?PROCALCITON  --   --   --   --  0.10 0.20  ?LATICACIDVEN 3.0* 1.6 2.0* 1.5  --   --   ? ? ? ?Recent Results (from the past 240 hour(s))  ?Culture, blood (routine x 2)     Status: None  ? Collection Time: 12/27/21  1:45 PM  ? Specimen: BLOOD  ?Result Value Ref Range Status  ? Specimen Description   Final  ?  BLOOD SITE NOT SPECIFIED ?Performed at [facility], [place] [address] [place], [place] [address] ?  ? Special Requests   Final  ?  BOTTLES DRAWN AEROBIC AND ANAEROBIC Blood Culture adequate volume ?Performed at [facility], [place] [address] [place], [place] [address] ?  ? Culture   Final  ?  NO GROWTH 5 DAYS ?Performed at [facility], [place] [address], [place], [place] [address] ?  ? Report Status 01/01/2022 FINAL  Final  ?Resp Panel by RT-PCR (Flu A&B, Covid) Nasopharyngeal Swab     Status: Abnormal  ? Collection Time: 12/27/21 10:00 PM  ? Specimen: Nasopharyngeal Swab  ?Result Value Ref Range Status  ? SARS Coronavirus 2 by RT PCR POSITIVE (A) NEGATIVE Final  ?  Comment: (NOTE) ?SARS-CoV-2 target nucleic acids are DETECTED. ? ?The SARS-CoV-2 RNA is generally detectable in upper respiratory ?specimens during the acute phase of infection. Positive results are ?indicative of the presence of the identified virus, but do not  rule ?out bacterial infection or co-infection with other pathogens not ?detected by the test. Clinical correlation with patient history and ?other diagnostic information is necessary to determine patient ?infection status. The expected result is Negative. ? ?Fact Sheet for Patients: ?EntrepreneurPulse.com.au ? ?Fact Sheet for Healthcare Providers: ?IncredibleEmployment.be ? ?This test is not yet approved or cleared by the [place] FDA and  ?has been authorized for detection and/or diagnosis of SARS-CoV-2 by ?FDA under an Emergency Use Authorization (EUA).  This EUA will ?remain in effect (meaning this test can be used) for the duration of  ?the COVID-19 declaration under Section 564(b)(1) of the A ct, 21 ?U.S.C. section 360bbb-3(b)(1), unless the authorization is ?terminated or revoked sooner. ? ?  ? Influenza A by PCR NEGATIVE NEGATIVE Final  ? Influenza B by PCR NEGATIVE NEGATIVE Final  ?  Comment: (NOTE) ?The Xpert Xpress SARS-CoV-2/FLU/RSV  plus assay is intended as an aid ?in the diagnosis of influenza from Nasopharyngeal swab specimens and ?should not be used as a sole basis for treatment. Nasal washings and ?aspirates are unacceptable for Xpert Xpress SARS-CoV-2/FLU/RSV ?testing. ? ?Fact Sheet for Patients: ?EntrepreneurPulse.com.au ? ?Fact Sheet for Healthcare Providers: ?IncredibleEmployment.be ? ?This test is not yet approved or cleared by the [place] FDA and ?has been authorized for detection and/or diagnosis of SARS-CoV-2 by ?FDA under an Emergency Use Authorization (EUA). This EUA will remain ?in effect (meaning this test can be used) for the duration of the ?COVID-19 declaration under Section 564(b)(1) of the Act, 21 U.S.C. ?section 360bbb-3(b)(1), unless the authorization is terminated or ?revoked. ? ?Performed at [facility], [place] [address] ?[place], [place] [address] ?  ?Culture, blood (routine x 2)      Status: None (Preliminary result)  ? Collection Time: 12/28/21  7:14 AM  ? Specimen: BLOOD  ?Result Value Ref Range Status  ? Specimen Description   Final  ?  BLOOD BLOOD LEFT FOREARM ?Performed a

## 2022-01-02 DIAGNOSIS — Z515 Encounter for palliative care: Secondary | ICD-10-CM | POA: Diagnosis not present

## 2022-01-02 DIAGNOSIS — Z7189 Other specified counseling: Secondary | ICD-10-CM | POA: Diagnosis not present

## 2022-01-02 DIAGNOSIS — F0390 Unspecified dementia without behavioral disturbance: Secondary | ICD-10-CM | POA: Diagnosis not present

## 2022-01-02 DIAGNOSIS — K56609 Unspecified intestinal obstruction, unspecified as to partial versus complete obstruction: Secondary | ICD-10-CM | POA: Diagnosis not present

## 2022-01-02 DIAGNOSIS — U071 COVID-19: Secondary | ICD-10-CM | POA: Diagnosis not present

## 2022-01-02 LAB — CULTURE, BLOOD (ROUTINE X 2): Culture: NO GROWTH

## 2022-01-02 NOTE — Progress Notes (Signed)
?PROGRESS NOTE ? ? ? ?Shelly Barrett  GYI:[id number] DOB: 1941-07-21 DOA: 12/27/2021 ?PCP: [name], MD  ? ?Brief Narrative:  ?81 y.o. female with medical history significant of dementia, HTN, scoliosis presented with altered mental status and vomiting.  On presentation, she was found to have high-grade small bowel obstruction at right obturator hernia containing a loop of small bowel with moderate to severe distention of the stomach and proximal and mid small bowel loops, no evidence of pneumoperitoneum with possible right lower lobe pneumonia or aspiration.  NG tube was placed General surgery was consulted.  She was started on IV fluids.  CT of the head was negative for acute intracranial abnormality.  Patient refused surgical intervention.  General surgery has signed off.  Palliative care was consulted.  She was switched to full comfort measures on 12/30/2021. ? ?Assessment & Plan: ?  ?Comfort measures only status comfort measures only  ?small bowel obstruction from high-grade obstruction from a right obturator hernia ?Lactic acidosis ?Possible right lower lobe pneumonia ?SIRS ?Acute metabolic encephalopathy ?Dementia ?Hypokalemia ?COVID-19 infection ?Hypertension ?Severe protein calorie malnutrition ? ?Plan ?-As per above discussion, she was switched to full comfort measures on 12/30/2021.  Family interested in residential hospice at [place].  Patient might need to finish 10-day course of isolation prior to placement at [facility]. ? ?DVT prophylaxis: None for comfort measures  ?code Status: DNR ?Family Communication: Son at bedside on 12/31/2021 ?Disposition Plan: ?Status is: Inpatient ?Remains inpatient appropriate because: Need for residential hospice placement  ?consultants: General surgery/palliative care ? ?Procedures: None ? ?Antimicrobials: Antibiotics have been discontinued ? ?Subjective: ?Patient seen and examined at bedside.  Poor historian.  Awake but slightly confused.  Asking for ice  chips.  ? Objective: ?Vitals:  ? 12/31/21 0535 12/31/21 1510 12/31/21 2213 01/01/22 2125  ?BP: 127/65 112/62 121/62 (!) 90/58  ?Pulse: 96 86 92 (!) 110  ?Resp: '16 20 20 18  '$ ?Temp: 98.2 ?F (36.8 ?C) 98.5 ?F (36.9 ?C) 98.9 ?F (37.2 ?C) 97.9 ?F (36.6 ?C)  ?TempSrc: Oral Oral Oral Oral  ?SpO2: (!) 80% (!) 85% (!) 85% 100%  ?Weight:      ?Height:      ? ? ?Intake/Output Summary (Last 24 hours) at 01/02/2022 0910 ?Last data filed at 01/02/2022 0534 ?Gross per 24 hour  ?Intake 593 ml  ?Output 700 ml  ?Net -107 ml  ? ? ?Filed Weights  ? 12/27/21 1852  ?Weight: 38 kg  ? ? ?Examination: ? ?General exam: No distress.  Awake.  Slightly confused.  Still requiring intermittent oxygen.  Poor historian.  No signs of distress.   ? ?Data Reviewed: I have personally reviewed following labs and imaging studies ? ?CBC: ?Recent Labs  ?Lab 12/27/21 ?1345 12/28/21 ?3500 12/29/21 ?0442  ?WBC 7.4 5.5 5.7  ?NEUTROABS 6.1  --  4.2  ?HGB 15.5* 13.2 11.2*  ?HCT 46.9* 39.9 34.5*  ?MCV 92.1 91.7 95.3  ?PLT 278 200 190  ? ? ?Basic Metabolic Panel: ?Recent Labs  ?Lab 12/27/21 ?1345 12/28/21 ?9381 12/29/21 ?0442  ?NA 139 139 141  ?K 3.0* 3.6 2.9*  ?CL 90* 100 105  ?CO2 35* 29 24  ?GLUCOSE 141* 107* 72  ?BUN 63* 43* 29*  ?CREATININE 0.79 0.56 0.61  ?CALCIUM 10.2 8.7* 8.4*  ?MG 2.4  --  2.0  ? ? ?GFR: ?Estimated Creatinine Clearance: 34.2 mL/min (by C-G formula based on SCr of 0.61 mg/dL). ?Liver Function Tests: ?Recent Labs  ?Lab 12/27/21 ?1345 12/28/21 ?8299 12/29/21 ?0442  ?  AST '27 22 25  '$ ?ALT '20 16 16  '$ ?ALKPHOS 64 47 41  ?BILITOT 0.7 0.9 0.8  ?PROT 7.6 5.5* 5.2*  ?ALBUMIN 4.5 3.2* 2.9*  ? ? ?No results for input(s): LIPASE, AMYLASE in the last 168 hours. ?No results for input(s): AMMONIA in the last 168 hours. ?Coagulation Profile: ?Recent Labs  ?Lab 12/27/21 ?1410 12/28/21 ?5643  ?INR 1.1 1.1  ? ? ?Cardiac Enzymes: ?No results for input(s): CKTOTAL, CKMB, CKMBINDEX, TROPONINI in the last 168 hours. ?BNP (last 3 results) ?No results for input(s):  PROBNP in the last 8760 hours. ?HbA1C: ?No results for input(s): HGBA1C in the last 72 hours. ?CBG: ?No results for input(s): GLUCAP in the last 168 hours. ?Lipid Profile: ?No results for input(s): CHOL, HDL, LDLCALC, TRIG, CHOLHDL, LDLDIRECT in the last 72 hours. ?Thyroid Function Tests: ?No results for input(s): TSH, T4TOTAL, FREET4, T3FREE, THYROIDAB in the last 72 hours. ?Anemia Panel: ?No results for input(s): VITAMINB12, FOLATE, FERRITIN, TIBC, IRON, RETICCTPCT in the last 72 hours. ?Sepsis Labs: ?Recent Labs  ?Lab 12/27/21 ?1345 12/27/21 ?1610 12/27/21 ?2007 12/27/21 ?2216 12/28/21 ?0715 12/29/21 ?3295  ?PROCALCITON  --   --   --   --  0.10 0.20  ?LATICACIDVEN 3.0* 1.6 2.0* 1.5  --   --   ? ? ? ?Recent Results (from the past 240 hour(s))  ?Culture, blood (routine x 2)     Status: None  ? Collection Time: 12/27/21  1:45 PM  ? Specimen: BLOOD  ?Result Value Ref Range Status  ? Specimen Description   Final  ?  BLOOD SITE NOT SPECIFIED ?Performed at [facility], [place] [address] [place], [place] [address] ?  ? Special Requests   Final  ?  BOTTLES DRAWN AEROBIC AND ANAEROBIC Blood Culture adequate volume ?Performed at [facility], [place] [address], [place], [place] [address] ?  ? Culture   Final  ?  NO GROWTH 5 DAYS ?Performed at [facility], [place] [address], [place],  [address] ?  ? Report Status 01/01/2022 FINAL  Final  ?Resp Panel by RT-PCR (Flu A&B, Covid) Nasopharyngeal Swab     Status: Abnormal  ? Collection Time: 12/27/21 10:00 PM  ? Specimen: Nasopharyngeal Swab  ?Result Value Ref Range Status  ? SARS Coronavirus 2 by RT PCR POSITIVE (A) NEGATIVE Final  ?  Comment: (NOTE) ?SARS-CoV-2 target nucleic acids are DETECTED. ? ?The SARS-CoV-2 RNA is generally detectable in upper respiratory ?specimens during the acute phase of infection. Positive results are ?indicative of the presence of the identified virus, but do not rule ?out bacterial infection or  co-infection with other pathogens not ?detected by the test. Clinical correlation with patient history and ?other diagnostic information is necessary to determine patient ?infection status. The expected result is Negative. ? ?Fact Sheet for Patients: ?EntrepreneurPulse.com.au ? ?Fact Sheet for Healthcare Providers: ?IncredibleEmployment.be ? ?This test is not yet approved or cleared by the [place] FDA and  ?has been authorized for detection and/or diagnosis of SARS-CoV-2 by ?FDA under an Emergency Use Authorization (EUA).  This EUA will ?remain in effect (meaning this test can be used) for the duration of  ?the COVID-19 declaration under Section 564(b)(1) of the A ct, 21 ?U.S.C. section 360bbb-3(b)(1), unless the authorization is ?terminated or revoked sooner. ? ?  ? Influenza A by PCR NEGATIVE NEGATIVE Final  ? Influenza B by PCR NEGATIVE NEGATIVE Final  ?  Comment: (NOTE) ?The Xpert Xpress SARS-CoV-2/FLU/RSV plus assay is intended as  an aid ?in the diagnosis of influenza from Nasopharyngeal swab specimens and ?should not be used as a sole basis for treatment. Nasal washings and ?aspirates are unacceptable for Xpert Xpress SARS-CoV-2/FLU/RSV ?testing. ? ?Fact Sheet for Patients: ?EntrepreneurPulse.com.au ? ?Fact Sheet for Healthcare Providers: ?IncredibleEmployment.be ? ?This test is not yet approved or cleared by the [place] FDA and ?has been authorized for detection and/or diagnosis of SARS-CoV-2 by ?FDA under an Emergency Use Authorization (EUA). This EUA will remain ?in effect (meaning this test can be used) for the duration of the ?COVID-19 declaration under Section 564(b)(1) of the Act, 21 U.S.C. ?section 360bbb-3(b)(1), unless the authorization is terminated or ?revoked. ? ?Performed at [facility], [place] [address] ?[place], [place] [address] ?  ?Culture, blood (routine x 2)     Status: None (Preliminary  result)  ? Collection Time: 12/28/21  7:14 AM  ? Specimen: BLOOD  ?Result Value Ref Range Status  ? Specimen Description   Final  ?  BLOOD BLOOD LEFT FOREARM ?Performed at [facility], [place] [address]

## 2022-01-02 NOTE — TOC Progression Note (Addendum)
Transition of Care (TOC) - Progression Note  ? ? ?Patient Details  ?Name: Shelly Barrett ?MRN: [id number] ?Date of Birth: Feb 16, 1941 ? ?Transition of Care (TOC) CM/SW Contact  ?Ross Ludwig, LCSW ?Phone Number: ?01/02/2022, 5:42 PM ? ?Clinical Narrative:    ? ?CSW received consult that patient's son Shelly Barrett would like [facility] for residential hospice placement.  CSW spoke to [place] via phone and provided choice of [facility] or [facility], he chose [facility].  CSW contacted [facility] to make the referral.  CSW awaiting review by [facility], and patient to be off isolation due to Covid.  Patient will have to be 10 days post Covid which would be March 29th, 2023.  CSW to continue to follow patient's progress throughout discharge planning. ? ? ?  ?  ? ?Expected Discharge Plan and Services ? [facility] once bed available and off of isolation from Covid. ?  ?  ?  ?  ?                ?  ?  ?  ?  ?  ?  ?  ?  ?  ?  ? ? ?Social Determinants of Health (SDOH) Interventions ?  ? ?Readmission [place] Interventions ?   ? View : No data to display.  ?  ?  ?  ? ? ?

## 2022-01-02 NOTE — Progress Notes (Signed)
Palliative: ? ?HPI: 81 y.o. female  with past medical history of dementia, hypertension, scoliosis, malnutrition, ileus, sigmoid diuverticulitis, left femoral hernia without obstruction or gangrene admitted on 12/27/2021 with small bowel obstruction and right lower lobe pneumonia. Per surgery note patient and family do not desire surgical intervention but to desire conservative management with knowledge that there is low likelihood of recovery without surgery and that this could become terminal. Palliative consultation requested.    ? ?Received phone call from patient's son requesting follow-up for Ms. Lightle today.  I met today with her son and we discussed her clinical course over the past 24 hours.  [name] reports that he thinks that he has been seeing improvement in how awake she is and that she has been trying to take in more by mouth over the past day.  He tells me he is concerned that this may be a rally before she worsens, but he also wants to ensure that this is not the beginning of her turning the corner to improvement.  He had asked about home hospice as potential option if she improves. ? ?Unfortunately, this is my first day meeting her so I do not have a prior baseline to go on.  I talked with him about this and we discussed plan for me to evaluate her today and then we will meet again tomorrow to determine if we are seeing continued improvement or more decline. ? ?I met today with Ms. Overley.  She was lying in bed with cough in her hand.  She took 2 small sips during time of my encounter.  She reports that she continues to have pain all over anytime that she moves but denies specific nausea at this point.  Feels that her regimen has been doing a good job of keeping her comfortable. ? ?Exam: Alert, slow to answer but appropriate responses in conversation.  No distress. Pain all over. Scoliosis. Thin, frail. Breathing regular, unlabored.  ? ?Plan: ?- Full comfort care.  ?- Scheduled and PRN medications  available to ensure comfort. Increase dosage/frequency as needed to achieve comfort and relief.  ?-Son notes that she is more awake today and been taking in a little by mouth.  We discussed that this may be small rally before things worsen rather than being long-term improvement.  We discussed plan to meet tomorrow to assess again.  I have also asked Hildred Alamin Romines to visit to assess her as well as she met her yesterday and this is my first chance to meet Ms. Guillet and so I do not have a baseline to determine if she is improving or worsening. ? ?45 min ? ?Micheline Rough, MD ?[facility] ?[phone] ? ? ? ?Greater than 50%  of this time was spent counseling and coordinating care related to the above assessment and plan   ?

## 2022-01-02 NOTE — Plan of Care (Signed)
Pt aox1, pleasant and cooperative with staff.  ?Comfort Measures only at this time.  ? ?Problem: Education: ?Goal: Knowledge of General Education information will improve ?Description: Including pain rating scale, medication(s)/side effects and non-pharmacologic comfort measures ?Outcome: Progressing ?  ?Problem: Health Behavior/Discharge Planning: ?Goal: Ability to manage health-related needs will improve ?Outcome: Progressing ?  ?Problem: Clinical Measurements: ?Goal: Ability to maintain clinical measurements within normal limits will improve ?Outcome: Progressing ?Goal: Will remain free from infection ?Outcome: Progressing ?Goal: Diagnostic test results will improve ?Outcome: Progressing ?Goal: Respiratory complications will improve ?Outcome: Progressing ?Goal: Cardiovascular complication will be avoided ?Outcome: Progressing ?  ?Problem: Activity: ?Goal: Risk for activity intolerance will decrease ?Outcome: Progressing ?  ?Problem: Nutrition: ?Goal: Adequate nutrition will be maintained ?Outcome: Progressing ?  ?Problem: Coping: ?Goal: Level of anxiety will decrease ?Outcome: Progressing ?  ?Problem: Elimination: ?Goal: Will not experience complications related to bowel motility ?Outcome: Progressing ?Goal: Will not experience complications related to urinary retention ?Outcome: Progressing ?  ?Problem: Pain Managment: ?Goal: General experience of comfort will improve ?Outcome: Progressing ?  ?Problem: Safety: ?Goal: Ability to remain free from injury will improve ?Outcome: Progressing ?  ?Problem: Skin Integrity: ?Goal: Risk for impaired skin integrity will decrease ?Outcome: Progressing ?  ?

## 2022-01-02 NOTE — Progress Notes (Signed)
Pt comfort care only at this time.  ?RN/PCT asked if family wished staff to check vitals, family declines at this time.  ?Morphine given per orders.  ?Mouth care provided. Fresh ice water given.  ?Pt refusing repositioning at this time.  ?

## 2022-01-03 DIAGNOSIS — U071 COVID-19: Secondary | ICD-10-CM | POA: Diagnosis not present

## 2022-01-03 DIAGNOSIS — Z7189 Other specified counseling: Secondary | ICD-10-CM | POA: Diagnosis not present

## 2022-01-03 DIAGNOSIS — K56609 Unspecified intestinal obstruction, unspecified as to partial versus complete obstruction: Secondary | ICD-10-CM | POA: Diagnosis not present

## 2022-01-03 DIAGNOSIS — Z515 Encounter for palliative care: Secondary | ICD-10-CM | POA: Diagnosis not present

## 2022-01-03 NOTE — Progress Notes (Signed)
Palliative: ? ?HPI: 81 y.o. female  with past medical history of dementia, hypertension, scoliosis, malnutrition, ileus, sigmoid diuverticulitis, left femoral hernia without obstruction or gangrene admitted on 12/27/2021 with small bowel obstruction and right lower lobe pneumonia. Per surgery note patient and family do not desire surgical intervention but to desire conservative management with knowledge that there is low likelihood of recovery without surgery and that this could become terminal. Palliative consultation requested.    ? ?I met with patient and her granddaughter is at the bedside.  They had been working to see if she wanted to eat anything and she had a small amount of eggs and a little bit of orange juice.  They report that they noticed that she is more sleepy today than yesterday. ? ?I also met with her son, [name], outside the room.  Yesterday, he had noted that it appeared she was improving a little bit, but today he reports that this seems to be of been a very temporary thing.  We discussed the fact that she is approaching end-of-life and options for care moving forward including home hospice versus residential hospice.  He tells me that family had been talking about trying to get together a plan to get her home with hospice support, however, he does not think this is going to be realistic.  He is agreeable to residential hospice if she is stable enough to transition when she finishes isolation period for COVID. ? ?Exam: Alert, slow to answer but appropriate responses in conversation.  No distress. Pain all over. Scoliosis. Thin, frail. Breathing regular, unlabored.  ? ?Plan: ?- Full comfort care.  ?- Scheduled and PRN medications available to ensure comfort. Increase dosage/frequency as needed to achieve comfort and relief.  ?-Had been discussing with son regarding question if she is having some improvement, but she appears to be worsening again today.  She really only had 2 tiny bite of eggs and  a small amount of orange juice.  We will continue to monitor closely but I think that her prognosis is going to be limited to days to weeks.  It may be, however, that she is stable enough to transition to residential hospice when she comes out of isolation.  We also discussed potential home hospice, but family is not going to be able to meet her needs at home.  He was agreeable to referral to [facility] so they are aware of her case for when the time comes that she is out of isolation. ? ?45 min ? ?Micheline Rough, MD ?[place] Team ?[phone] ? ? ? ?Greater than 50%  of this time was spent counseling and coordinating care related to the above assessment and plan   ?

## 2022-01-03 NOTE — Progress Notes (Signed)
[facility], [facility]) Hospital Liaison note.  ? ?Received request from Agency for family interest in [facility]. Patients chart is under review. Patient is currently on isolation for Covid 19 and this will end 01/05/22.  ? ?Spoke to son [name] about isolation and he understands that she will need to be off isolation prior to transition to [facility]. [facility] is aware of the referral. ? ?Clementeen Hoof, BSN, RN ?Hospital Liaison ?[phone] ? ?

## 2022-01-03 NOTE — Progress Notes (Signed)
Palliative: ? ?HPI: 81 y.o. female  with past medical history of dementia, hypertension, scoliosis, malnutrition, ileus, sigmoid diuverticulitis, left femoral hernia without obstruction or gangrene admitted on 12/27/2021 with small bowel obstruction and right lower lobe pneumonia. Per surgery note patient and family do not desire surgical intervention but to desire conservative management with knowledge that there is low likelihood of recovery without surgery and that this could become terminal. Palliative consultation requested.    ? ?I saw and examined Ms. Florene Glen today and discussed with the patient's son, [name], and her granddaughter, Chloe.  Ms. Bai appeared more tired and was less interactive today.  She did open her eyes and track me and mumbled a few words, but she was not nearly as conversant as she has been in the past couple of days.  Family reports that she has been more tired overall today. ? ?I discussed again with [name] regarding completion of COVID isolation and potential transition to [facility] at that time.  [place] has referral reach out to him, but plan will be to evaluate her appropriateness for [facility] a little bit closer to end of COVID isolation. ? ?Exam: Sleepier and less responsive today.  No distress.  Denies pain. Scoliosis. Thin, frail. Breathing regular, unlabored.  ? ?Plan: ?- Full comfort care.  ?- Scheduled and PRN medications available to ensure comfort.  Symptoms appear managed today.  Increase dosage/frequency as needed to achieve comfort and relief.  ?-Continue to monitor closely but I think that her prognosis is going to be limited to days to weeks.  It may be, however, that she is stable enough to transition to residential hospice when she comes out of isolation.  We also discussed potential home hospice, but family is not going to be able to meet her needs at home.  [place] referral has been made and they are going to evaluate when the time comes for her  to come out of COVID isolation. ? ?Total time: 25 minutes ? ?Micheline Rough, MD ?[place] Team ?[phone] ? ? ? ?Greater than 50%  of this time was spent counseling and coordinating care related to the above assessment and plan   ?

## 2022-01-03 NOTE — Plan of Care (Signed)
Plan of care reviewed. 

## 2022-01-03 NOTE — Progress Notes (Signed)
?PROGRESS NOTE ? ? ? ?Shelly Barrett  WOE:[id number] DOB: [birth year] DOA: 12/27/2021 ?PCP: [name], MD  ? ?Brief Narrative:  ?81 y.o. female with medical history significant of dementia, HTN, scoliosis presented with altered mental status and vomiting.  On presentation, she was found to have high-grade small bowel obstruction at right obturator hernia containing a loop of small bowel with moderate to severe distention of the stomach and proximal and mid small bowel loops, no evidence of pneumoperitoneum with possible right lower lobe pneumonia or aspiration.  NG tube was placed General surgery was consulted.  She was started on IV fluids.  CT of the head was negative for acute intracranial abnormality.  Patient refused surgical intervention.  General surgery has signed off.  Palliative care was consulted.  She was switched to full comfort measures on 12/30/2021. ? ?Assessment & Plan: ?  ?Comfort measures only status comfort measures only  ?small bowel obstruction from high-grade obstruction from a right obturator hernia ?Lactic acidosis ?Possible right lower lobe pneumonia ?SIRS ?Acute metabolic encephalopathy ?Dementia ?Hypokalemia ?COVID-19 infection ?Hypertension ?Severe protein calorie malnutrition ? ?Plan ?-As per above discussion, she was switched to full comfort measures on 12/30/2021.  Family interested in residential hospice at [place].  Patient will need to finish 10-day course of isolation prior to placement at [facility]. ? ?DVT prophylaxis: None for comfort measures  ?code Status: DNR ?Family Communication: Son at bedside on 12/31/2021 ?Disposition Plan: ?Status is: Inpatient ?Remains inpatient appropriate because: Need for residential hospice placement  ?consultants: General surgery/palliative care ? ?Procedures: None ? ?Antimicrobials: Antibiotics have been discontinued ? ?Subjective: ?Patient seen and examined at bedside.  Poor historian.  Currently not agitated and looks comfortable.   Objective: ?Vitals:  ? 12/31/21 2213 01/01/22 2125 01/02/22 0845 01/03/22 0610  ?BP: 121/62 (!) 90/58 (!) 94/54 94/66  ?Pulse: 92 (!) 110 88 (!) 116  ?Resp: '20 18  16  '$ ?Temp: 98.9 ?F (37.2 ?C) 97.9 ?F (36.6 ?C)  98.4 ?F (36.9 ?C)  ?TempSrc: Oral Oral  Oral  ?SpO2: (!) 85% 100%  98%  ?Weight:      ?Height:      ? ? ?Intake/Output Summary (Last 24 hours) at 01/03/2022 0756 ?Last data filed at 01/03/2022 0600 ?Gross per 24 hour  ?Intake 720 ml  ?Output 450 ml  ?Net 270 ml  ? ? ?Filed Weights  ? 12/27/21 1852  ?Weight: 38 kg  ? ? ?Examination: ? ?General exam: Slightly confused.  No signs of agitation.  Looks comfortable ?Data Reviewed: I have personally reviewed following labs and imaging studies ? ?CBC: ?Recent Labs  ?Lab 12/27/21 ?1345 12/28/21 ?0037 12/29/21 ?0442  ?WBC 7.4 5.5 5.7  ?NEUTROABS 6.1  --  4.2  ?HGB 15.5* 13.2 11.2*  ?HCT 46.9* 39.9 34.5*  ?MCV 92.1 91.7 95.3  ?PLT 278 200 190  ? ? ?Basic Metabolic Panel: ?Recent Labs  ?Lab 12/27/21 ?1345 12/28/21 ?0488 12/29/21 ?0442  ?NA 139 139 141  ?K 3.0* 3.6 2.9*  ?CL 90* 100 105  ?CO2 35* 29 24  ?GLUCOSE 141* 107* 72  ?BUN 63* 43* 29*  ?CREATININE 0.79 0.56 0.61  ?CALCIUM 10.2 8.7* 8.4*  ?MG 2.4  --  2.0  ? ? ?GFR: ?Estimated Creatinine Clearance: 34.2 mL/min (by C-G formula based on SCr of 0.61 mg/dL). ?Liver Function Tests: ?Recent Labs  ?Lab 12/27/21 ?1345 12/28/21 ?8916 12/29/21 ?0442  ?AST '27 22 25  '$ ?ALT '20 16 16  '$ ?ALKPHOS 64 47 41  ?BILITOT 0.7 0.9 0.8  ?  PROT 7.6 5.5* 5.2*  ?ALBUMIN 4.5 3.2* 2.9*  ? ? ?No results for input(s): LIPASE, AMYLASE in the last 168 hours. ?No results for input(s): AMMONIA in the last 168 hours. ?Coagulation Profile: ?Recent Labs  ?Lab 12/27/21 ?1410 12/28/21 ?9798  ?INR 1.1 1.1  ? ? ?Cardiac Enzymes: ?No results for input(s): CKTOTAL, CKMB, CKMBINDEX, TROPONINI in the last 168 hours. ?BNP (last 3 results) ?No results for input(s): PROBNP in the last 8760 hours. ?HbA1C: ?No results for input(s): HGBA1C in the last 72 hours. ?CBG: ?No  results for input(s): GLUCAP in the last 168 hours. ?Lipid Profile: ?No results for input(s): CHOL, HDL, LDLCALC, TRIG, CHOLHDL, LDLDIRECT in the last 72 hours. ?Thyroid Function Tests: ?No results for input(s): TSH, T4TOTAL, FREET4, T3FREE, THYROIDAB in the last 72 hours. ?Anemia Panel: ?No results for input(s): VITAMINB12, FOLATE, FERRITIN, TIBC, IRON, RETICCTPCT in the last 72 hours. ?Sepsis Labs: ?Recent Labs  ?Lab 12/27/21 ?1345 12/27/21 ?1610 12/27/21 ?2007 12/27/21 ?2216 12/28/21 ?0715 12/29/21 ?9211  ?PROCALCITON  --   --   --   --  0.10 0.20  ?LATICACIDVEN 3.0* 1.6 2.0* 1.5  --   --   ? ? ? ?Recent Results (from the past 240 hour(s))  ?Culture, blood (routine x 2)     Status: None  ? Collection Time: 12/27/21  1:45 PM  ? Specimen: BLOOD  ?Result Value Ref Range Status  ? Specimen Description   Final  ?  BLOOD SITE NOT SPECIFIED ?Performed at [facility], [place] [address], [place], [place] [address] ?  ? Special Requests   Final  ?  BOTTLES DRAWN AEROBIC AND ANAEROBIC Blood Culture adequate volume ?Performed at [facility], [place] [address] [place], [place] [address] ?  ? Culture   Final  ?  NO GROWTH 5 DAYS ?Performed at [facility], [place] [address], [place], [place] [address] ?  ? Report Status 01/01/2022 FINAL  Final  ?Resp Panel by RT-PCR (Flu A&B, Covid) Nasopharyngeal Swab     Status: Abnormal  ? Collection Time: 12/27/21 10:00 PM  ? Specimen: Nasopharyngeal Swab  ?Result Value Ref Range Status  ? SARS Coronavirus 2 by RT PCR POSITIVE (A) NEGATIVE Final  ?  Comment: (NOTE) ?SARS-CoV-2 target nucleic acids are DETECTED. ? ?The SARS-CoV-2 RNA is generally detectable in upper respiratory ?specimens during the acute phase of infection. Positive results are ?indicative of the presence of the identified virus, but do not rule ?out bacterial infection or co-infection with other pathogens not ?detected by the test. Clinical correlation with patient history and ?other  diagnostic information is necessary to determine patient ?infection status. The expected result is Negative. ? ?Fact Sheet for Patients: ?EntrepreneurPulse.com.au ? ?Fact Sheet for Healthcare Providers: ?IncredibleEmployment.be ? ?This test is not yet approved or cleared by the [place] FDA and  ?has been authorized for detection and/or diagnosis of SARS-CoV-2 by ?FDA under an Emergency Use Authorization (EUA).  This EUA will ?remain in effect (meaning this test can be used) for the duration of  ?the COVID-19 declaration under Section 564(b)(1) of the A ct, 21 ?U.S.C. section 360bbb-3(b)(1), unless the authorization is ?terminated or revoked sooner. ? ?  ? Influenza A by PCR NEGATIVE NEGATIVE Final  ? Influenza B by PCR NEGATIVE NEGATIVE Final  ?  Comment: (NOTE) ?The Xpert Xpress SARS-CoV-2/FLU/RSV plus assay is intended as an aid ?in the diagnosis of influenza from Nasopharyngeal swab specimens and ?should not be used as a sole basis  for treatment. Nasal washings and ?aspirates are unacceptable for Xpert Xpress SARS-CoV-2/FLU/RSV ?testing. ? ?Fact Sheet for Patients: ?EntrepreneurPulse.com.au ? ?Fact Sheet for Healthcare Providers: ?IncredibleEmployment.be ? ?This test is not yet approved or cleared by the [place] FDA and ?has been authorized for detection and/or diagnosis of SARS-CoV-2 by ?FDA under an Emergency Use Authorization (EUA). This EUA will remain ?in effect (meaning this test can be used) for the duration of the ?COVID-19 declaration under Section 564(b)(1) of the Act, 21 U.S.C. ?section 360bbb-3(b)(1), unless the authorization is terminated or ?revoked. ? ?Performed at [facility], [place] [address] ?[place], [place] [address] ?  ?Culture, blood (routine x 2)     Status: None  ? Collection Time: 12/28/21  7:14 AM  ? Specimen: BLOOD  ?Result Value Ref Range Status  ? Specimen Description   Final  ?  BLOOD  BLOOD LEFT FOREARM ?Performed at [facility], [place] [address] [place], [place] [address] ?  ? Special Requests   Final  ?  BOTTLES DRAWN AEROBIC ONLY Blood Culture results may not be

## 2022-01-04 DIAGNOSIS — Z515 Encounter for palliative care: Secondary | ICD-10-CM | POA: Diagnosis not present

## 2022-01-04 DIAGNOSIS — U071 COVID-19: Secondary | ICD-10-CM | POA: Diagnosis not present

## 2022-01-04 DIAGNOSIS — K56609 Unspecified intestinal obstruction, unspecified as to partial versus complete obstruction: Secondary | ICD-10-CM | POA: Diagnosis not present

## 2022-01-04 DIAGNOSIS — F0390 Unspecified dementia without behavioral disturbance: Secondary | ICD-10-CM | POA: Diagnosis not present

## 2022-01-04 NOTE — Progress Notes (Addendum)
Patient BP:Shelly Barrett      DOB: 12/14/1940      NID:[id number] ? ? ? ?  ?Palliative Medicine Team ? ? ? ?Subjective: Bedside symptom check. Granddaughter and granddaughter's boyfriend visiting at time of visit. ? ? ?Physical exam: Patient comfortably resting at time of visit. Patient is alert and conversational. She denies any pain or discomfort, she is well padded with pillows in bed. Her breathing is even and non-labored with cannula in place, no excessive secretions noted. Patient enjoying ice chips provided by grand daughter. ? ? ?Assessment and plan: Family denies any needs or concerns at this time, they are excited for her to get off of covid precautions in the next few days. This RN checked in with bedside RN, no new needs or concerns at this time. Will continue to follow for any changes or advances. ? ? ? ?Thank you for allowing the Palliative Medicine Team to assist in the care of this patient. ?  ?  ?Damian Leavell, MSN, RN ?Palliative Medicine Team ?Team Phone: [phone]  ?This phone is monitored 7a-7p, please reach out to attending physician outside of these hours for urgent needs.   ?

## 2022-01-04 NOTE — Progress Notes (Signed)
?PROGRESS NOTE ? ? ? ?Shelly Barrett  JSE:[id number] DOB: 05-06-1941 DOA: 12/27/2021 ?PCP: [name], MD  ? ?Brief Narrative:  ?81 y.o. female with medical history significant of dementia, HTN, scoliosis presented with altered mental status and vomiting.  On presentation, she was found to have high-grade small bowel obstruction at right obturator hernia containing a loop of small bowel with moderate to severe distention of the stomach and proximal and mid small bowel loops, no evidence of pneumoperitoneum with possible right lower lobe pneumonia or aspiration.  NG tube was placed General surgery was consulted.  She was started on IV fluids.  CT of the head was negative for acute intracranial abnormality.  Patient refused surgical intervention.  General surgery has signed off.  Palliative care was consulted.  She was switched to full comfort measures on 12/30/2021. ? ?Assessment & Plan: ?  ?Comfort measures only status comfort measures only  ?small bowel obstruction from high-grade obstruction from a right obturator hernia ?Lactic acidosis ?Possible right lower lobe pneumonia ?SIRS ?Acute metabolic encephalopathy ?Dementia ?Hypokalemia ?COVID-19 infection ?Hypertension ?Severe protein calorie malnutrition ? ?Plan ?-As per above discussion, she was switched to full comfort measures on 12/30/2021.  Family interested in residential hospice at [place].  Patient will need to finish 10-day course of isolation prior to placement at [facility]. ? ?DVT prophylaxis: None for comfort measures  ?code Status: DNR ?Family Communication: Son at bedside on 12/31/2021 ?Disposition Plan: ?Status is: Inpatient ?Remains inpatient appropriate because: Need for residential hospice placement  ?consultants: General surgery/palliative care ? ?Procedures: None ? ?Antimicrobials: Antibiotics have been discontinued ? ?Subjective: ?Patient seen and examined at bedside.  Poor historian.  No agitation, vomiting reported ?  objective: ?Vitals:  ? 01/01/22 2125 01/02/22 0845 01/03/22 0610 01/03/22 1945  ?BP: (!) 90/58 (!) '94/54 94/66 97/67 '$  ?Pulse: (!) 110 88 (!) 116 (!) 125  ?Resp: '18  16 15  '$ ?Temp: 97.9 ?F (36.6 ?C)  98.4 ?F (36.9 ?C) 98.2 ?F (36.8 ?C)  ?TempSrc: Oral  Oral Oral  ?SpO2: 100%  98% 96%  ?Weight:      ?Height:      ? ?No intake or output data in the 24 hours ending 01/04/22 0751 ? ?Filed Weights  ? 12/27/21 1852  ?Weight: 38 kg  ? ? ?Examination: ? ?General exam: Currently looks comfortable with no signs of agitation.  Looks chronically ill and deconditioned.   ? ?Data Reviewed: I have personally reviewed following labs and imaging studies ? ?CBC: ?Recent Labs  ?Lab 12/29/21 ?0737  ?WBC 5.7  ?NEUTROABS 4.2  ?HGB 11.2*  ?HCT 34.5*  ?MCV 95.3  ?PLT 190  ? ? ?Basic Metabolic Panel: ?Recent Labs  ?Lab 12/29/21 ?1062  ?NA 141  ?K 2.9*  ?CL 105  ?CO2 24  ?GLUCOSE 72  ?BUN 29*  ?CREATININE 0.61  ?CALCIUM 8.4*  ?MG 2.0  ? ? ?GFR: ?Estimated Creatinine Clearance: 34.2 mL/min (by C-G formula based on SCr of 0.61 mg/dL). ?Liver Function Tests: ?Recent Labs  ?Lab 12/29/21 ?6948  ?AST 25  ?ALT 16  ?ALKPHOS 41  ?BILITOT 0.8  ?PROT 5.2*  ?ALBUMIN 2.9*  ? ? ?No results for input(s): LIPASE, AMYLASE in the last 168 hours. ?No results for input(s): AMMONIA in the last 168 hours. ?Coagulation Profile: ?No results for input(s): INR, PROTIME in the last 168 hours. ? ?Cardiac Enzymes: ?No results for input(s): CKTOTAL, CKMB, CKMBINDEX, TROPONINI in the last 168 hours. ?BNP (last 3 results) ?No results for input(s): PROBNP in the  last 8760 hours. ?HbA1C: ?No results for input(s): HGBA1C in the last 72 hours. ?CBG: ?No results for input(s): GLUCAP in the last 168 hours. ?Lipid Profile: ?No results for input(s): CHOL, HDL, LDLCALC, TRIG, CHOLHDL, LDLDIRECT in the last 72 hours. ?Thyroid Function Tests: ?No results for input(s): TSH, T4TOTAL, FREET4, T3FREE, THYROIDAB in the last 72 hours. ?Anemia Panel: ?No results for input(s): VITAMINB12,  FOLATE, FERRITIN, TIBC, IRON, RETICCTPCT in the last 72 hours. ?Sepsis Labs: ?Recent Labs  ?Lab 12/29/21 ?6270  ?PROCALCITON 0.20  ? ? ? ?Recent Results (from the past 240 hour(s))  ?Culture, blood (routine x 2)     Status: None  ? Collection Time: 12/27/21  1:45 PM  ? Specimen: BLOOD  ?Result Value Ref Range Status  ? Specimen Description   Final  ?  BLOOD SITE NOT SPECIFIED ?Performed at [facility], [place] [address], [place], [place] [address] ?  ? Special Requests   Final  ?  BOTTLES DRAWN AEROBIC AND ANAEROBIC Blood Culture adequate volume ?Performed at [facility], [place] [address] [place], [place] [address] ?  ? Culture   Final  ?  NO GROWTH 5 DAYS ?Performed at [facility], [place] [address] [place], [place] [address] ?  ? Report Status 01/01/2022 FINAL  Final  ?Resp Panel by RT-PCR (Flu A&B, Covid) Nasopharyngeal Swab     Status: Abnormal  ? Collection Time: 12/27/21 10:00 PM  ? Specimen: Nasopharyngeal Swab  ?Result Value Ref Range Status  ? SARS Coronavirus 2 by RT PCR POSITIVE (A) NEGATIVE Final  ?  Comment: (NOTE) ?SARS-CoV-2 target nucleic acids are DETECTED. ? ?The SARS-CoV-2 RNA is generally detectable in upper respiratory ?specimens during the acute phase of infection. Positive results are ?indicative of the presence of the identified virus, but do not rule ?out bacterial infection or co-infection with other pathogens not ?detected by the test. Clinical correlation with patient history and ?other diagnostic information is necessary to determine patient ?infection status. The expected result is Negative. ? ?Fact Sheet for Patients: ?EntrepreneurPulse.com.au ? ?Fact Sheet for Healthcare Providers: ?IncredibleEmployment.be ? ?This test is not yet approved or cleared by the [place] FDA and  ?has been authorized for detection and/or diagnosis of SARS-CoV-2 by ?FDA under an Emergency Use Authorization (EUA).  This EUA  will ?remain in effect (meaning this test can be used) for the duration of  ?the COVID-19 declaration under Section 564(b)(1) of the A ct, 21 ?U.S.C. section 360bbb-3(b)(1), unless the authorization is ?terminated or revoked sooner. ? ?  ? Influenza A by PCR NEGATIVE NEGATIVE Final  ? Influenza B by PCR NEGATIVE NEGATIVE Final  ?  Comment: (NOTE) ?The Xpert Xpress SARS-CoV-2/FLU/RSV plus assay is intended as an aid ?in the diagnosis of influenza from Nasopharyngeal swab specimens and ?should not be used as a sole basis for treatment. Nasal washings and ?aspirates are unacceptable for Xpert Xpress SARS-CoV-2/FLU/RSV ?testing. ? ?Fact Sheet for Patients: ?EntrepreneurPulse.com.au ? ?Fact Sheet for Healthcare Providers: ?IncredibleEmployment.be ? ?This test is not yet approved or cleared by the [place] FDA and ?has been authorized for detection and/or diagnosis of SARS-CoV-2 by ?FDA under an Emergency Use Authorization (EUA). This EUA will remain ?in effect (meaning this test can be used) for the duration of the ?COVID-19 declaration under Section 564(b)(1) of the Act, 21 U.S.C. ?section 360bbb-3(b)(1), unless the authorization is terminated or ?revoked. ? ?Performed at [facility], [place] [address] ?[place], [place] [address] ?  ?Culture,  blood (routine x 2)     Status: None  ? Collection Time: 12/28/21  7:14 AM  ? Specimen: BLOOD  ?Result Value Ref Range Status  ? Specimen Description   Final  ?  BLOOD BLOOD LEFT FOREARM ?Performed at [facility], [place] [address], [place], [place] [address] ?  ? Special Requests   Final  ?  BOTTLES DRAWN AEROBIC ONLY Blood Culture results may not be optimal due to an inadequate volume of blood received in culture bottles ?Performed at [facility], [place] [address], [place], [place] [address] ?  ? Culture   Final  ?  NO GROWTH 5 DAYS ?Performed at [facility], [place] [address]  [place], [place] [address] ?  ? Report Status 01/02/2022 FINAL  Final  ? ?  ? ? ? ? ? ?Radiology Studies: ?No results found. ? ? ? ? ? ?Scheduled Meds: ? Chlorhexidine Gluconate Cloth  6 each Topical Q0600  ? mouth rinse  15 mL [place]

## 2022-01-05 DIAGNOSIS — K56609 Unspecified intestinal obstruction, unspecified as to partial versus complete obstruction: Secondary | ICD-10-CM | POA: Diagnosis not present

## 2022-01-05 DIAGNOSIS — G9341 Metabolic encephalopathy: Secondary | ICD-10-CM | POA: Diagnosis not present

## 2022-01-05 DIAGNOSIS — U071 COVID-19: Secondary | ICD-10-CM | POA: Diagnosis not present

## 2022-01-05 DIAGNOSIS — Z515 Encounter for palliative care: Secondary | ICD-10-CM | POA: Diagnosis not present

## 2022-01-05 MED ORDER — MORPHINE SULFATE (PF) 2 MG/ML IV SOLN
1.0000 mg | INTRAVENOUS | Status: DC
  Administered 2022-01-05 – 2022-01-06 (×6): 1 mg via INTRAVENOUS
  Filled 2022-01-05 (×7): qty 1

## 2022-01-05 NOTE — Progress Notes (Addendum)
[facility]) Hospital Liaison note: ? ?[facility] eligibility confirmed. As per Dr Tomasa Hosteller, [facility] Physician, patient is able to transfer on 01/07/22.  ? ?Spoke to son [name] to update him that patient is eligible to go to [facility] on 3/30 if a bed is available and the patient is stable to transfer. Will assess patient on 3/30 for transfer stability and pending bed availability. TOC Eric aware that [facility] will follow up on Thursday, 01/07/22.  ? ?Please do not hesitate to call with any questions or concerns. ? ?Thank you, ?Lorelee Market, LPN ?[facility] Liaison  ?[phone] ?

## 2022-01-05 NOTE — Progress Notes (Signed)
? ?                                                                                                                                                     ?                                                   ?Daily Progress Note  ? ?Patient Name: Shelly Barrett       Date: 01/05/2022 ?DOB: October 17, 1940  Age: 81 y.o. MRN#: [id number] ?Attending Physician: Aline August, MD ?Primary Care Physician: [name], MD ?Admit Date: 12/27/2021 ? ?Reason for Consultation/Follow-up: Hospice Evaluation ? ?Subjective: ? Resting comfortably, no distress, not awake or alert at time of my visit. Non labored breathing.  ? ?Length of Stay: 9 ? ?Current Medications: ?Scheduled Meds:  ? Chlorhexidine Gluconate Cloth  6 each Topical Q0600  ? mouth rinse  15 mL Mouth Rinse BID  ?  morphine injection  1 mg Intravenous Q4H  ? ? ?Continuous Infusions: ? ? ?PRN Meds: ?antiseptic oral rinse, glycopyrrolate, LORazepam, morphine injection, ondansetron **OR** ondansetron (ZOFRAN) IV, polyvinyl alcohol ? ?Physical Exam         ?Regular work of breathing ?No distress ?Elderly appearing lady resting in bed on one side ? ?Vital Signs: BP 112/66 (BP Location: Left Arm)   Pulse (!) 127   Temp 99.2 ?F (37.3 ?C) (Oral)   Resp 18   Ht '5\' 1"'$  (1.549 m)   Wt 38 kg   SpO2 96%   BMI 15.83 kg/m?  ?SpO2: SpO2: 96 % ?O2 Device: O2 Device: Nasal Cannula ?O2 Flow Rate: O2 Flow Rate (L/min): 2 L/min ? ?Intake/output summary:  ?Intake/Output Summary (Last 24 hours) at 01/05/2022 1043 ?Last data filed at 01/04/2022 1811 ?Gross per 24 hour  ?Intake 220 ml  ?Output --  ?Net 220 ml  ? ?LBM: Last BM Date :  (PTA) ?Baseline Weight: Weight: 38 kg ?Most recent weight: Weight: 38 kg ? ?     ?Palliative Assessment/Data: ? ? ? ? ? ?Patient Active Problem List  ? Diagnosis Date Noted  ? Comfort measures only status 12/31/2021  ? Lactic acidosis 12/30/2021  ? COVID-19 virus infection 12/29/2021  ? SBO (small bowel obstruction) ([place]) 12/27/2021  ? PNA (pneumonia) 12/27/2021  ?  Acute metabolic encephalopathy 10/13/7251  ? Dementia without behavioral disturbance ([place]) 12/27/2021  ? Peripheral edema 05/12/2021  ? B12 deficiency 05/13/2020  ? Severely underweight adult 05/12/2020  ? Avascular necrosis of hip ([place]) 05/08/2020  ? Bladder infection, chronic 09/28/2019  ? Hypokalemia 07/03/2019  ? Hypotension 07/03/2019  ? Scoliosis of thoracolumbar spine 01/19/2018  ? Aortic atherosclerosis ([place]) by CT scan on 12/14/2017 12/14/2017  ?  Protein-calorie malnutrition, severe ([place]) 06/23/2017  ? Body mass index (BMI) less than 16.5 12/31/2016  ? Fibrocystic breast changes, bilateral 08/28/2015  ? Hyperlipidemia   ? Vitamin D deficiency   ? Carotid stenosis 05/28/2010  ? ? ?Palliative Care Assessment & Plan  ? ?Patient Profile: ? 81 y.o. female  with past medical history of dementia, hypertension, scoliosis, malnutrition, ileus, sigmoid diuverticulitis, left femoral hernia without obstruction or gangrene admitted on 12/27/2021 with small bowel obstruction and right lower lobe pneumonia. Per surgery note patient and family do not desire surgical intervention but to desire conservative management with knowledge that there is low likelihood of recovery without surgery and that this could become terminal. Palliative consultation requested.   ? ?Assessment: ? PPS 20% ?Comfort measures only status comfort measures only  ?Small bowel obstruction from high-grade obstruction from a right obturator hernia ?Lactic acidosis ?Possible right lower lobe pneumonia ?SIRS ?Acute metabolic encephalopathy ?Dementia ?Hypokalemia ?COVID-19 infection ?Hypertension ?Severe protein calorie malnutrition ? ?Recommendations/Plan: ? Continue comfort measures ?Call placed and discussed with son ?Evaluate for residential hospice tomorrow 3-29 ?Prognosis likely few days, end of life signs and symptoms and dying trajectory discussed with son on phone.  ? ?Goals of Care and Additional Recommendations: ?Limitations on Scope of  Treatment: Full Comfort Care ? ?Code Status: ? ?  ?Code Status Orders  ?(From admission, onward)  ?  ? ? ?  ? ?  Start     Ordered  ? 12/30/21 0939  Do not attempt resuscitation (DNR)  Continuous       ?Question Answer Comment  ?In the event of cardiac or respiratory ARREST Do not call a ?code blue?   ?In the event of cardiac or respiratory ARREST Do not perform Intubation, CPR, defibrillation or ACLS   ?In the event of cardiac or respiratory ARREST Use medication by any route, position, wound care, and other measures to relive pain and suffering. May use oxygen, suction and manual treatment of airway obstruction as needed for comfort.   ?Comments Confirmed with son   ?  ? 12/30/21 0092  ? ?  ?  ? ?  ? ?Code Status History   ? ? Date Active Date Inactive Code Status Order ID Comments User Context  ? 12/27/2021 1943 12/30/2021 0938 DNR [id number]  Jonnie Finner, DO Inpatient  ? 07/03/2019 0349 07/06/2019 1913 DNR [id number]  Toy Baker, MD ED  ? 07/02/2019 2324 07/03/2019 6389 DNR [id number]  Toy Baker, MD ED  ? ?  ? ?Advance Directive Documentation   ? ?Flowsheet Row Most Recent Value  ?Type of Advance Directive Healthcare Power of [place], Living will, Out of facility DNR (pink MOST or yellow form)  ?Pre-existing out of facility DNR order (yellow form or pink MOST form) --  ?"MOST" Form in Place? --  ? ?  ? ? ?Prognosis: ? Hours - Days ? ?Discharge Planning: ?Hospice facility ? ?Care plan was discussed with  son  ? ?Thank you for allowing the Palliative Medicine Team to assist in the care of this patient. ? ? ?Time In:  10 Time Out: 10.25 Total Time 25 Prolonged Time Billed  no   ? ?   ?Greater than 50%  of this time was spent counseling and coordinating care related to the above assessment and plan. ? ?Loistine Chance, MD ? ?Please contact Palliative Medicine Team phone at [phone] for questions and concerns.  ? ? ? ? ? ?

## 2022-01-05 NOTE — TOC Progression Note (Signed)
Transition of Care (TOC) - Progression Note  ? ? ?Patient Details  ?Name: Shelly Barrett ?MRN: [id number] ?Date of Birth: 03-30-1941 ? ?Transition of Care (TOC) CM/SW Contact  ?Ross Ludwig, LCSW ?Phone Number: ?01/05/2022, 2:41 PM ? ?Clinical Narrative:    ? ?CSW was informed that patient can not transfer to [facility] until 3/30 due to being Covid+.  CSW to continue to follow patient's progress throughout discharge planning. ? ? ?  ?  ? ?Expected Discharge Plan and Services ?  ?  ?  ?  ?  ?                ?  ?  ?  ?  ?  ?  ?  ?  ?  ?  ? ? ?Social Determinants of Health (SDOH) Interventions ?  ? ?Readmission Risk Interventions ?   ? View : No data to display.  ?  ?  ?  ? ? ?

## 2022-01-05 NOTE — Progress Notes (Signed)
?PROGRESS NOTE ? ? ? ?Shelly Barrett  WSF:[id number] DOB: Dec 27, 1940 DOA: 12/27/2021 ?PCP: [name], MD  ? ?Brief Narrative:  ?81 y.o. female with medical history significant of dementia, HTN, scoliosis presented with altered mental status and vomiting.  On presentation, she was found to have high-grade small bowel obstruction at right obturator hernia containing a loop of small bowel with moderate to severe distention of the stomach and proximal and mid small bowel loops, no evidence of pneumoperitoneum with possible right lower lobe pneumonia or aspiration.  NG tube was placed General surgery was consulted.  She was started on IV fluids.  CT of the head was negative for acute intracranial abnormality.  Patient refused surgical intervention.  General surgery has signed off.  Palliative care was consulted.  She was switched to full comfort measures on 12/30/2021. ? ?Assessment & Plan: ?  ?Comfort measures only status comfort measures only  ?Small bowel obstruction from high-grade obstruction from a right obturator hernia ?Lactic acidosis ?Possible right lower lobe pneumonia ?SIRS ?Acute metabolic encephalopathy ?Dementia ?Hypokalemia ?COVID-19 infection ?Hypertension ?Severe protein calorie malnutrition ? ?Plan ?-As per above discussion, she was switched to full comfort measures on 12/30/2021.  Family interested in residential hospice at [place].  Today is day #10 of isolation.  Isolation can be discontinued from tomorrow onwards.  Hopefully, she can be discharged to become placed tomorrow. ? ?DVT prophylaxis: None for comfort measures  ?code Status: DNR ?Family Communication: Son at bedside on 12/31/2021 ?Disposition Plan: ?Status is: Inpatient ?Remains inpatient appropriate because: Need for residential hospice placement, possibly tomorrow if bed is available ?consultants: General surgery/palliative care ? ?Procedures: None ? ?Antimicrobials: Antibiotics have been discontinued ? ?Subjective: ?Patient seen  and examined at bedside.  Poor historian.  No overnight agitation or vomiting reported.  ? Objective: ?Vitals:  ? 01/02/22 0845 01/03/22 0610 01/03/22 1945 01/04/22 1304  ?BP: (!) '94/54 94/66 97/67 '$ 112/66  ?Pulse: 88 (!) 116 (!) 125 (!) 127  ?Resp:  '16 15 18  '$ ?Temp:  98.4 ?F (36.9 ?C) 98.2 ?F (36.8 ?C) 99.2 ?F (37.3 ?C)  ?TempSrc:  Oral Oral Oral  ?SpO2:  98% 96%   ?Weight:      ?Height:      ? ? ?Intake/Output Summary (Last 24 hours) at 01/05/2022 0744 ?Last data filed at 01/04/2022 1811 ?Gross per 24 hour  ?Intake 340 ml  ?Output --  ?Net 340 ml  ? ? ?Filed Weights  ? 12/27/21 1852  ?Weight: 38 kg  ? ? ?Examination: ? ?General exam: No signs of agitation.  Looks comfortable currently.  Looks chronically ill and deconditioned.   ? ?Data Reviewed: I have personally reviewed following labs and imaging studies ? ?CBC: ?No results for input(s): WBC, NEUTROABS, HGB, HCT, MCV, PLT in the last 168 hours. ? ?Basic Metabolic Panel: ?No results for input(s): NA, K, CL, CO2, GLUCOSE, BUN, CREATININE, CALCIUM, MG, PHOS in the last 168 hours. ? ?GFR: ?Estimated Creatinine Clearance: 34.2 mL/min (by C-G formula based on SCr of 0.61 mg/dL). ?Liver Function Tests: ?No results for input(s): AST, ALT, ALKPHOS, BILITOT, PROT, ALBUMIN in the last 168 hours. ? ?No results for input(s): LIPASE, AMYLASE in the last 168 hours. ?No results for input(s): AMMONIA in the last 168 hours. ?Coagulation Profile: ?No results for input(s): INR, PROTIME in the last 168 hours. ? ?Cardiac Enzymes: ?No results for input(s): CKTOTAL, CKMB, CKMBINDEX, TROPONINI in the last 168 hours. ?BNP (last 3 results) ?No results for input(s): PROBNP in the last 8760  hours. ?HbA1C: ?No results for input(s): HGBA1C in the last 72 hours. ?CBG: ?No results for input(s): GLUCAP in the last 168 hours. ?Lipid Profile: ?No results for input(s): CHOL, HDL, LDLCALC, TRIG, CHOLHDL, LDLDIRECT in the last 72 hours. ?Thyroid Function Tests: ?No results for input(s): TSH, T4TOTAL,  FREET4, T3FREE, THYROIDAB in the last 72 hours. ?Anemia Panel: ?No results for input(s): VITAMINB12, FOLATE, FERRITIN, TIBC, IRON, RETICCTPCT in the last 72 hours. ?Sepsis Labs: ?No results for input(s): PROCALCITON, LATICACIDVEN in the last 168 hours. ? ? ?Recent Results (from the past 240 hour(s))  ?Culture, blood (routine x 2)     Status: None  ? Collection Time: 12/27/21  1:45 PM  ? Specimen: BLOOD  ?Result Value Ref Range Status  ? Specimen Description   Final  ?  BLOOD SITE NOT SPECIFIED ?Performed at [facility], [place] [address], [place], [place] [address] ?  ? Special Requests   Final  ?  BOTTLES DRAWN AEROBIC AND ANAEROBIC Blood Culture adequate volume ?Performed at [place], [place] [address] [place], [place] [address] ?  ? Culture   Final  ?  NO GROWTH 5 DAYS ?Performed at [facility], [place] [address] [place], [place] [address] ?  ? Report Status 01/01/2022 FINAL  Final  ?Resp Panel by RT-PCR (Flu A&B, Covid) Nasopharyngeal Swab     Status: Abnormal  ? Collection Time: 12/27/21 10:00 PM  ? Specimen: Nasopharyngeal Swab  ?Result Value Ref Range Status  ? SARS Coronavirus 2 by RT PCR POSITIVE (A) NEGATIVE Final  ?  Comment: (NOTE) ?SARS-CoV-2 target nucleic acids are DETECTED. ? ?The SARS-CoV-2 RNA is generally detectable in upper respiratory ?specimens during the acute phase of infection. Positive results are ?indicative of the presence of the identified virus, but do not rule ?out bacterial infection or co-infection with other pathogens not ?detected by the test. Clinical correlation with patient history and ?other diagnostic information is necessary to determine patient ?infection status. The expected result is Negative. ? ?Fact Sheet for Patients: ?EntrepreneurPulse.com.au ? ?Fact Sheet for Healthcare Providers: ?IncredibleEmployment.be ? ?This test is not yet approved or cleared by the [place] FDA and  ?has been  authorized for detection and/or diagnosis of SARS-CoV-2 by ?FDA under an Emergency Use Authorization (EUA).  This EUA will ?remain in effect (meaning this test can be used) for the duration of  ?the COVID-19 declaration under Section 564(b)(1) of the A ct, 21 ?U.S.C. section 360bbb-3(b)(1), unless the authorization is ?terminated or revoked sooner. ? ?  ? Influenza A by PCR NEGATIVE NEGATIVE Final  ? Influenza B by PCR NEGATIVE NEGATIVE Final  ?  Comment: (NOTE) ?The Xpert Xpress SARS-CoV-2/FLU/RSV plus assay is intended as an aid ?in the diagnosis of influenza from Nasopharyngeal swab specimens and ?should not be used as a sole basis for treatment. Nasal washings and ?aspirates are unacceptable for Xpert Xpress SARS-CoV-2/FLU/RSV ?testing. ? ?Fact Sheet for Patients: ?EntrepreneurPulse.com.au ? ?Fact Sheet for Healthcare Providers: ?IncredibleEmployment.be ? ?This test is not yet approved or cleared by the [place] FDA and ?has been authorized for detection and/or diagnosis of SARS-CoV-2 by ?FDA under an Emergency Use Authorization (EUA). This EUA will remain ?in effect (meaning this test can be used) for the duration of the ?COVID-19 declaration under Section 564(b)(1) of the Act, 21 U.S.C. ?section 360bbb-3(b)(1), unless the authorization is terminated or ?revoked. ? ?Performed at [facility], [place] [address] ?[place], [place] [address] ?  ?Culture, blood (routine  x 2)     Status: None  ? Collection Time: 12/28/21  7:14 AM  ? Specimen: BLOOD  ?Result Value Ref Range Status  ? Specimen Description   Final  ?  BLOOD BLOOD LEFT FOREARM ?Performed at [facility], [place] [address], [place], [place] [address] ?  ? Special Requests   Final  ?  BOTTLES DRAWN AEROBIC ONLY Blood Culture results may not be optimal due to an inadequate volume of blood received in culture bottles ?Performed at [facility], [place] [address],  [place], [place] [address] ?  ? Culture   Final  ?  NO GROWTH 5 DAYS ?Performed at [facility], [place] [address], [place], [place] [address] ?  ? Report Status 01/02/2022 FINAL  Final  ? ?  ? ? ? ? ? ?Radiol

## 2022-01-06 DIAGNOSIS — K56609 Unspecified intestinal obstruction, unspecified as to partial versus complete obstruction: Secondary | ICD-10-CM | POA: Diagnosis not present

## 2022-01-06 DIAGNOSIS — Z515 Encounter for palliative care: Secondary | ICD-10-CM | POA: Diagnosis not present

## 2022-01-06 MED ORDER — MORPHINE SULFATE (PF) 2 MG/ML IV SOLN
2.0000 mg | INTRAVENOUS | Status: DC
  Administered 2022-01-06 – 2022-01-08 (×12): 2 mg via INTRAVENOUS
  Filled 2022-01-06 (×11): qty 1

## 2022-01-06 MED ORDER — POTASSIUM CHLORIDE 20 MEQ PO PACK: 40.0000 meq | PACK | Freq: Once | ORAL | Status: DC

## 2022-01-06 NOTE — Progress Notes (Signed)
? ?                                                                                                                                                     ?                                                   ?Daily Progress Note  ? ?Patient Name: Shelly Barrett       Date: 01/06/2022 ?DOB: 1941/08/28  Age: 81 y.o. MRN#: [id number] ?Attending Physician: Shawna Clamp, MD ?Primary Care Physician: [name], MD ?Admit Date: 12/27/2021 ? ?Reason for Consultation/Follow-up: Hospice Evaluation ? ?Subjective: ? Resting comfortably, no distress, lying on one side, awake, responds some. Does not appear to be in distress at present, however, call placed and discussed with son Mr Sheppard Coil who stated that the patient has been complaining of pain and moaning at times. We talked about adjusting her opioids and monitoring her, see below.  ?  ?Length of Stay: 10 ? ?Current Medications: ?Scheduled Meds:  ? mouth rinse  15 mL Mouth Rinse BID  ?  morphine injection  2 mg Intravenous Q4H  ? ? ?Continuous Infusions: ? ? ?PRN Meds: ?antiseptic oral rinse, glycopyrrolate, LORazepam, morphine injection, ondansetron **OR** ondansetron (ZOFRAN) IV, polyvinyl alcohol ? ?Physical Exam         ?Regular work of breathing ?No distress ?Elderly appearing lady resting in bed on one side ? ?Vital Signs: BP 106/64 (BP Location: Left Arm)   Pulse 66   Temp 98.8 ?F (37.1 ?C) (Oral)   Resp 12   Ht '5\' 1"'$  (1.549 m)   Wt 38 kg   SpO2 96%   BMI 15.83 kg/m?  ?SpO2: SpO2: 96 % ?O2 Device: O2 Device: Nasal Cannula ?O2 Flow Rate: O2 Flow Rate (L/min): 2 L/min ? ?Intake/output summary:  ?Intake/Output Summary (Last 24 hours) at 01/06/2022 1337 ?Last data filed at 01/06/2022 1245 ?Gross per 24 hour  ?Intake 580 ml  ?Output 250 ml  ?Net 330 ml  ? ? ?LBM: Last BM Date :  (PTA) ?Baseline Weight: Weight: 38 kg ?Most recent weight: Weight: 38 kg ? ?     ?Palliative Assessment/Data: ? ? ? ? ? ?Patient Active Problem List  ? Diagnosis Date Noted  ? Comfort  measures only status 12/31/2021  ? Lactic acidosis 12/30/2021  ? COVID-19 virus infection 12/29/2021  ? SBO (small bowel obstruction) ([place]) 12/27/2021  ? PNA (pneumonia) 12/27/2021  ? Acute metabolic encephalopathy 78/29/5621  ? Dementia without behavioral disturbance ([place]) 12/27/2021  ? Peripheral edema 05/12/2021  ? B12 deficiency 05/13/2020  ? Severely underweight adult 05/12/2020  ? Avascular necrosis of hip ([place]) 05/08/2020  ?  Bladder infection, chronic 09/28/2019  ? Hypokalemia 07/03/2019  ? Hypotension 07/03/2019  ? Scoliosis of thoracolumbar spine 01/19/2018  ? Aortic atherosclerosis ([place]) by CT scan on 12/14/2017 12/14/2017  ? Protein-calorie malnutrition, severe ([place]) 06/23/2017  ? Body mass index (BMI) less than 16.5 12/31/2016  ? Fibrocystic breast changes, bilateral 08/28/2015  ? Hyperlipidemia   ? Vitamin D deficiency   ? Carotid stenosis 05/28/2010  ? ? ?Palliative Care Assessment & Plan  ? ?Patient Profile: ? 81 y.o. female  with past medical history of dementia, hypertension, scoliosis, malnutrition, ileus, sigmoid diuverticulitis, left femoral hernia without obstruction or gangrene admitted on 12/27/2021 with small bowel obstruction and right lower lobe pneumonia. Per surgery note patient and family do not desire surgical intervention but to desire conservative management with knowledge that there is low likelihood of recovery without surgery and that this could become terminal. Palliative consultation requested.   ? ?Assessment: ? PPS 20% ?Comfort measures only status comfort measures only  ?Small bowel obstruction from high-grade obstruction from a right obturator hernia ?Lactic acidosis ?Possible right lower lobe pneumonia ?SIRS ?Acute metabolic encephalopathy ?Dementia ?Hypokalemia ?COVID-19 infection ?Hypertension ?Severe protein calorie malnutrition ? ?Recommendations/Plan: ? Continue comfort measures, adjust comfort medications.  ?Call placed and discussed with son.  ?Evaluate for residential  hospice tomorrow 3-30.  ?Prognosis likely few days.  ? ?Goals of Care and Additional Recommendations: ?Limitations on Scope of Treatment: Full Comfort Care ? ?Code Status: ? ?  ?Code Status Orders  ?(From admission, onward)  ?  ? ? ?  ? ?  Start     Ordered  ? 12/30/21 0939  Do not attempt resuscitation (DNR)  Continuous       ?Question Answer Comment  ?In the event of cardiac or respiratory ARREST Do not call a ?code blue?   ?In the event of cardiac or respiratory ARREST Do not perform Intubation, CPR, defibrillation or ACLS   ?In the event of cardiac or respiratory ARREST Use medication by any route, position, wound care, and other measures to relive pain and suffering. May use oxygen, suction and manual treatment of airway obstruction as needed for comfort.   ?Comments Confirmed with son   ?  ? 12/30/21 5621  ? ?  ?  ? ?  ? ?Code Status History   ? ? Date Active Date Inactive Code Status Order ID Comments User Context  ? 12/27/2021 1943 12/30/2021 0938 DNR [id number]  Jonnie Finner, DO Inpatient  ? 07/03/2019 0349 07/06/2019 1913 DNR [id number]  Toy Baker, MD ED  ? 07/02/2019 2324 07/03/2019 3244 DNR [id number]  Toy Baker, MD ED  ? ?  ? ?Advance Directive Documentation   ? ?Flowsheet Row Most Recent Value  ?Type of Advance Directive Healthcare Power of [place], Living will, Out of facility DNR (pink MOST or yellow form)  ?Pre-existing out of facility DNR order (yellow form or pink MOST form) --  ?"MOST" Form in Place? --  ? ?  ? ? ?Prognosis: ? Hours - Days ? ?Discharge Planning: ?Hospice facility ? ?Care plan was discussed with  son  ? ?Thank you for allowing the Palliative Medicine Team to assist in the care of this patient. ? ? ?Time In: 1300 Time Out: 1325 Total Time 25 Prolonged Time Billed  no   ? ?   ?Greater than 50%  of this time was spent counseling and coordinating care related to the above assessment and plan. ? ?Loistine Chance, MD ? ?Please contact Palliative Medicine Team phone at  [phone]  for questions and concerns.  ? ? ? ? ? ?

## 2022-01-06 NOTE — Progress Notes (Signed)
?PROGRESS NOTE ? ? ? ?Shelly Barrett  KPT:[id number] DOB: [birth year] DOA: 12/27/2021 ?PCP: [name], MD  ? ? ?Brief Narrative:  ?This 81 y.o. female with medical history significant of dementia, HTN, scoliosis presented with altered mental status and vomiting.  On presentation, she was found to have high-grade small bowel obstruction at right obturator hernia containing a loop of small bowel with moderate to severe distention of the stomach and proximal and mid small bowel loops, no evidence of pneumoperitoneum with possible right lower lobe pneumonia or aspiration.  NG tube was placed,  General surgery was consulted.  She was started on IV fluids.  CT of the head was negative for acute intracranial abnormality.  Patient refused surgical intervention.  General surgery has signed off. Palliative care was consulted.  She was switched to full comfort measures on 12/30/2021. ? ? ?Assessment & Plan: ?  ?Principal Problem: ?  SBO (small bowel obstruction) ([place]) ?Active Problems: ?  Hyperlipidemia ?  Protein-calorie malnutrition, severe ([place]) ?  Hypokalemia ?  PNA (pneumonia) ?  Acute metabolic encephalopathy ?  Dementia without behavioral disturbance ([place]) ?  COVID-19 virus infection ?  Lactic acidosis ?  Comfort measures only status ? ?She was switched to full comfort measures on 12/30/2021.  Family interested in residential hospice at [place].  Today is day #11 of isolation.  Isolation can be discontinued from tomorrow onwards.  Hopefully, she can be discharged [facility] tomorrow. ?  ? ?DVT prophylaxis: Comfort care ?Code Status: DNR ?Family Communication: No family at bed side. ?Disposition Plan:  ?Status is: Inpatient ?Remains inpatient appropriate because: Admitted for small bowel obstruction,  refused surgery , now care transitioned to comfort care.  Discharge to [facility] tomorrow ?  ? ?Consultants:  ?Palliative care ?General surgery ? ?Procedures: None ? ?Antimicrobials: ?Anti-infectives (From  admission, onward)  ? ? Start     Dose/Rate Route Frequency Ordered Stop  ? 12/28/21 0600  metroNIDAZOLE (FLAGYL) IVPB 500 mg  Status:  Discontinued       ? 500 mg ?100 mL/hr over 60 Minutes Intravenous Every 12 hours 12/27/21 1943 12/28/21 1140  ? 12/28/21 0600  ceFEPIme (MAXIPIME) 2 g in sodium chloride 0.9 % 100 mL IVPB  Status:  Discontinued       ? 2 g ?200 mL/hr over 30 Minutes Intravenous Every 12 hours 12/27/21 1815 12/30/21 0917  ? 12/27/21 1645  metroNIDAZOLE (FLAGYL) IVPB 500 mg  Status:  Discontinued       ? 500 mg ?100 mL/hr over 60 Minutes Intravenous Every 12 hours 12/27/21 1640 12/27/21 1951  ? 12/27/21 1645  vancomycin (VANCOCIN) IVPB 1000 mg/200 mL premix       ? 1,000 mg ?200 mL/hr over 60 Minutes Intravenous  Once 12/27/21 1642 12/27/21 1824  ? 12/27/21 1645  ceFEPIme (MAXIPIME) 2 g in sodium chloride 0.9 % 100 mL IVPB       ? 2 g ?200 mL/hr over 30 Minutes Intravenous  Once 12/27/21 1642 12/27/21 1823  ? ?  ?  ? ?Subjective: ?Patient was seen and examined at bedside.  Overnight events noted.  Patient reports having back pain which is reasonably controlled. ? ?Objective: ?Vitals:  ? 01/04/22 1304 01/05/22 1128 01/05/22 1927 01/06/22 1243  ?BP: 112/66 111/69 114/84 106/64  ?Pulse: (!) 127 (!) 116 (!) 115 66  ?Resp: '18 20 20 12  '$ ?Temp: 99.2 ?F (37.3 ?C) 98.7 ?F (37.1 ?C) 97.9 ?F (36.6 ?C) 98.8 ?F (37.1 ?C)  ?TempSrc: Oral  Oral Oral Oral  ?SpO2:  97% 97% 96%  ?Weight:      ?Height:      ? ? ?Intake/Output Summary (Last 24 hours) at 01/06/2022 1437 ?Last data filed at 01/06/2022 1245 ?Gross per 24 hour  ?Intake 580 ml  ?Output 250 ml  ?Net 330 ml  ? ?Filed Weights  ? 12/27/21 1852  ?Weight: 38 kg  ? ? ?Examination: ? ?General exam: Appears comfortable, not in any acute distress.  Chronically ill looking and deconditioned. ?Respiratory system: CTA bilaterally, no wheezing, no crackles, respiratory effort normal. ?Cardiovascular system: S1 & S2 heard, regular rate and rhythm, no murmur. ?Gastrointestinal  system: Abdomen is soft, nontender, nondistended BS+ ?Central nervous system: Alert and oriented x 2. No focal neurological deficits. ?Extremities: No edema, no cyanosis, no clubbing. ?Skin: No rashes, lesions or ulcers ?Psychiatry: Judgement and insight appear normal. Mood & affect appropriate.  ? ? ? ?Data Reviewed: I have personally reviewed following labs and imaging studies ? ?CBC: ?No results for input(s): WBC, NEUTROABS, HGB, HCT, MCV, PLT in the last 168 hours. ?Basic Metabolic Panel: ?No results for input(s): NA, K, CL, CO2, GLUCOSE, BUN, CREATININE, CALCIUM, MG, PHOS in the last 168 hours. ?GFR: ?Estimated Creatinine Clearance: 34.2 mL/min (by C-G formula based on SCr of 0.61 mg/dL). ?Liver Function Tests: ?No results for input(s): AST, ALT, ALKPHOS, BILITOT, PROT, ALBUMIN in the last 168 hours. ?No results for input(s): LIPASE, AMYLASE in the last 168 hours. ?No results for input(s): AMMONIA in the last 168 hours. ?Coagulation Profile: ?No results for input(s): INR, PROTIME in the last 168 hours. ?Cardiac Enzymes: ?No results for input(s): CKTOTAL, CKMB, CKMBINDEX, TROPONINI in the last 168 hours. ?BNP (last 3 results) ?No results for input(s): PROBNP in the last 8760 hours. ?HbA1C: ?No results for input(s): HGBA1C in the last 72 hours. ?CBG: ?No results for input(s): GLUCAP in the last 168 hours. ?Lipid Profile: ?No results for input(s): CHOL, HDL, LDLCALC, TRIG, CHOLHDL, LDLDIRECT in the last 72 hours. ?Thyroid Function Tests: ?No results for input(s): TSH, T4TOTAL, FREET4, T3FREE, THYROIDAB in the last 72 hours. ?Anemia Panel: ?No results for input(s): VITAMINB12, FOLATE, FERRITIN, TIBC, IRON, RETICCTPCT in the last 72 hours. ?Sepsis Labs: ?No results for input(s): PROCALCITON, LATICACIDVEN in the last 168 hours. ? ?Recent Results (from the past 240 hour(s))  ?Resp Panel by RT-PCR (Flu A&B, Covid) Nasopharyngeal Swab     Status: Abnormal  ? Collection Time: 12/27/21 10:00 PM  ? Specimen: Nasopharyngeal  Swab  ?Result Value Ref Range Status  ? SARS Coronavirus 2 by RT PCR POSITIVE (A) NEGATIVE Final  ?  Comment: (NOTE) ?SARS-CoV-2 target nucleic acids are DETECTED. ? ?The SARS-CoV-2 RNA is generally detectable in upper respiratory ?specimens during the acute phase of infection. Positive results are ?indicative of the presence of the identified virus, but do not rule ?out bacterial infection or co-infection with other pathogens not ?detected by the test. Clinical correlation with patient history and ?other diagnostic information is necessary to determine patient ?infection status. The expected result is Negative. ? ?Fact Sheet for Patients: ?EntrepreneurPulse.com.au ? ?Fact Sheet for Healthcare Providers: ?IncredibleEmployment.be ? ?This test is not yet approved or cleared by the [place] FDA and  ?has been authorized for detection and/or diagnosis of SARS-CoV-2 by ?FDA under an Emergency Use Authorization (EUA).  This EUA will ?remain in effect (meaning this test can be used) for the duration of  ?the COVID-19 declaration under Section 564(b)(1) of the A ct, 21 ?U.S.C. section 360bbb-3(b)(1), unless  the authorization is ?terminated or revoked sooner. ? ?  ? Influenza A by PCR NEGATIVE NEGATIVE Final  ? Influenza B by PCR NEGATIVE NEGATIVE Final  ?  Comment: (NOTE) ?The Xpert Xpress SARS-CoV-2/FLU/RSV plus assay is intended as an aid ?in the diagnosis of influenza from Nasopharyngeal swab specimens and ?should not be used as a sole basis for treatment. Nasal washings and ?aspirates are unacceptable for Xpert Xpress SARS-CoV-2/FLU/RSV ?testing. ? ?Fact Sheet for Patients: ?EntrepreneurPulse.com.au ? ?Fact Sheet for Healthcare Providers: ?IncredibleEmployment.be ? ?This test is not yet approved or cleared by the [place] FDA and ?has been authorized for detection and/or diagnosis of SARS-CoV-2 by ?FDA under an Emergency Use Authorization  (EUA). This EUA will remain ?in effect (meaning this test can be used) for the duration of the ?COVID-19 declaration under Section 564(b)(1) of the Act, 21 U.S.C. ?section 360bbb-3(b)(1), unless the auth

## 2022-01-06 NOTE — Progress Notes (Signed)
[facility]) Hospital Liaison Note ? ?Continuing to follow patient for transfer to [facility]. Tomorrow patient is eligible to transfer, if there is room availability. ? ?Hospital Liaison will reach out to [facility] and family tomorrow regarding room availability. ? ?Please call with any hospice related questions or concerns. ? ?Thank you,  ?Margaretmary Eddy, BSN, RN ?[facility] Hospital Liaison ?[phone] ?

## 2022-01-07 DIAGNOSIS — K56609 Unspecified intestinal obstruction, unspecified as to partial versus complete obstruction: Secondary | ICD-10-CM | POA: Diagnosis not present

## 2022-01-07 DIAGNOSIS — Z515 Encounter for palliative care: Secondary | ICD-10-CM | POA: Diagnosis not present

## 2022-01-07 NOTE — TOC Progression Note (Signed)
Transition of Care (TOC) - Progression Note  ? ? ?Patient Details  ?Name: AMYRIA KOMAR ?MRN: [id number] ?Date of Birth: 24-Mar-1941 ? ?Transition of Care (TOC) CM/SW Contact  ?Trish Mage, LCSW ?Phone Number: ?01/07/2022, 10:07 AM ? ?Clinical Narrative:   [facility] reports no bed today, first day of eligibility post COVID.  TOC will continue to follow during the course of hospitalization. ? ? ? ? ?  ?  ? ?Expected Discharge Plan and Services ?  ?  ?  ?  ?  ?Expected Discharge Date: 01/07/22               ?  ?  ?  ?  ?  ?  ?  ?  ?  ?  ? ? ?Social Determinants of Health (SDOH) Interventions ?  ? ?Readmission Risk Interventions ?   ? View : No data to display.  ?  ?  ?  ? ? ?

## 2022-01-07 NOTE — Progress Notes (Signed)
? ?                                                                                                                                                     ?                                                   ?Daily Progress Note  ? ?Patient Name: Shelly Barrett       Date: 01/07/2022 ?DOB: Oct 28, 1940  Age: 81 y.o. MRN#: [id number] ?Attending Physician: Shawna Clamp, MD ?Primary Care Physician: [name], MD ?Admit Date: 12/27/2021 ? ?Reason for Consultation/Follow-up: Hospice Evaluation ? ?Subjective: ? Awake alert, lying on one side, responds to questions asked, appears weak,minimal PO intake, called and updated son on phone, discussed with TOC no [facility] beds today.  ?  ?  ?Length of Stay: 11 ? ?Current Medications: ?Scheduled Meds:  ? mouth rinse  15 mL Mouth Rinse BID  ?  morphine injection  2 mg Intravenous Q4H  ? ? ?Continuous Infusions: ? ? ?PRN Meds: ?antiseptic oral rinse, glycopyrrolate, LORazepam, morphine injection, ondansetron **OR** ondansetron (ZOFRAN) IV, polyvinyl alcohol ? ?Physical Exam         ?Regular work of breathing ?No distress ?Elderly appearing lady resting in bed on one side ? ?Vital Signs: BP 115/79 (BP Location: Left Arm)   Pulse (!) 121   Temp 98.8 ?F (37.1 ?C) (Oral)   Resp (!) 24   Ht '5\' 1"'$  (1.549 m)   Wt 38 kg   SpO2 97%   BMI 15.83 kg/m?  ?SpO2: SpO2: 97 % ?O2 Device: O2 Device: Nasal Cannula ?O2 Flow Rate: O2 Flow Rate (L/min): 2 L/min ? ?Intake/output summary:  ?Intake/Output Summary (Last 24 hours) at 01/07/2022 1046 ?Last data filed at 01/07/2022 3875 ?Gross per 24 hour  ?Intake 355 ml  ?Output 250 ml  ?Net 105 ml  ? ? ?LBM: Last BM Date :  (PTA) ?Baseline Weight: Weight: 38 kg ?Most recent weight: Weight: 38 kg ? ?     ?Palliative Assessment/Data: ? ? ? ? ? ?Patient Active Problem List  ? Diagnosis Date Noted  ? Comfort measures only status 12/31/2021  ? Lactic acidosis 12/30/2021  ? COVID-19 virus infection 12/29/2021  ? SBO (small bowel obstruction) ([place])  12/27/2021  ? PNA (pneumonia) 12/27/2021  ? Acute metabolic encephalopathy 64/33/2951  ? Dementia without behavioral disturbance ([place]) 12/27/2021  ? Peripheral edema 05/12/2021  ? B12 deficiency 05/13/2020  ? Severely underweight adult 05/12/2020  ? Avascular necrosis of hip ([place]) 05/08/2020  ? Bladder infection, chronic 09/28/2019  ? Hypokalemia 07/03/2019  ? Hypotension 07/03/2019  ? Scoliosis of thoracolumbar spine 01/19/2018  ? Aortic  atherosclerosis ([place]) by CT scan on 12/14/2017 12/14/2017  ? Protein-calorie malnutrition, severe ([place]) 06/23/2017  ? Body mass index (BMI) less than 16.5 12/31/2016  ? Fibrocystic breast changes, bilateral 08/28/2015  ? Hyperlipidemia   ? Vitamin D deficiency   ? Carotid stenosis 05/28/2010  ? ? ?Palliative Care Assessment & Plan  ? ?Patient Profile: ? 81 y.o. female  with past medical history of dementia, hypertension, scoliosis, malnutrition, ileus, sigmoid diuverticulitis, left femoral hernia without obstruction or gangrene admitted on 12/27/2021 with small bowel obstruction and right lower lobe pneumonia. Per surgery note patient and family do not desire surgical intervention but to desire conservative management with knowledge that there is low likelihood of recovery without surgery and that this could become terminal. Palliative consultation requested.   ? ?Assessment: ? PPS 20% ?Comfort measures only status comfort measures only  ?Small bowel obstruction from high-grade obstruction from a right obturator hernia ?Lactic acidosis ?Possible right lower lobe pneumonia ?SIRS ?Acute metabolic encephalopathy ?Dementia ?Hypokalemia ?COVID-19 infection ?Hypertension ?Severe protein calorie malnutrition ? ?Recommendations/Plan: ? Continue comfort measures, adjust comfort medications on an as needed basis.  ?Call placed and updated son.  ?Awaiting bed availability at residential hospice    ?Prognosis likely few days.  ? ?Goals of Care and Additional Recommendations: ?Limitations on  Scope of Treatment: Full Comfort Care ? ?Code Status: ? ?  ?Code Status Orders  ?(From admission, onward)  ?  ? ? ?  ? ?  Start     Ordered  ? 12/30/21 0939  Do not attempt resuscitation (DNR)  Continuous       ?Question Answer Comment  ?In the event of cardiac or respiratory ARREST Do not call a ?code blue?   ?In the event of cardiac or respiratory ARREST Do not perform Intubation, CPR, defibrillation or ACLS   ?In the event of cardiac or respiratory ARREST Use medication by any route, position, wound care, and other measures to relive pain and suffering. May use oxygen, suction and manual treatment of airway obstruction as needed for comfort.   ?Comments Confirmed with son   ?  ? 12/30/21 1610  ? ?  ?  ? ?  ? ?Code Status History   ? ? Date Active Date Inactive Code Status Order ID Comments User Context  ? 12/27/2021 1943 12/30/2021 0938 DNR [id number]  Jonnie Finner, DO Inpatient  ? 07/03/2019 0349 07/06/2019 1913 DNR [id number]  Toy Baker, MD ED  ? 07/02/2019 2324 07/03/2019 5621 DNR [id number]  Toy Baker, MD ED  ? ?  ? ?Advance Directive Documentation   ? ?Flowsheet Row Most Recent Value  ?Type of Advance Directive Healthcare Power of [place], Living will, Out of facility DNR (pink MOST or yellow form)  ?Pre-existing out of facility DNR order (yellow form or pink MOST form) --  ?"MOST" Form in Place? --  ? ?  ? ? ?Prognosis: ? Hours - Days ? ?Discharge Planning: ?Hospice facility ? ?Care plan was discussed with  son  ? ?Thank you for allowing the Palliative Medicine Team to assist in the care of this patient. ? ? ?Time In: 10 Time Out: 10.25 Total Time 25 Prolonged Time Billed  no   ? ?   ?Greater than 50%  of this time was spent counseling and coordinating care related to the above assessment and plan. ? ?Loistine Chance, MD ? ?Please contact Palliative Medicine Team phone at [phone] for questions and concerns.  ? ? ? ? ? ?

## 2022-01-07 NOTE — Progress Notes (Signed)
[facility]) Hospital Liaison note: ? ?Spoke to son -[name] to update on bed availability. Unfortunately, [facility] does not have a bed today. Will continue to follow for transfer stability.  ? ?Hospital Liaison will reach out to [facility] and family tomorrow regarding room availability. ?  ?Please call with any hospice related questions or concerns. ? ?Thank you, ?Lorelee Market, LPN ?[facility] Hospital Liaison  ?[phone] ?

## 2022-01-07 NOTE — Progress Notes (Signed)
?PROGRESS NOTE ? ? ? ?Shelly Barrett  DTO:[id number] DOB: [birth year] DOA: 12/27/2021 ?PCP: [name], MD  ? ? ?Brief Narrative:  ?This 81 y.o. female with medical history significant of dementia, HTN, scoliosis presented with altered mental status and vomiting.  On presentation, she was found to have high-grade small bowel obstruction at right obturator hernia containing a loop of small bowel with moderate to severe distention of the stomach and proximal and mid small bowel loops, no evidence of pneumoperitoneum with possible right lower lobe pneumonia or aspiration.  NG tube was placed,  General surgery was consulted.  She was started on IV fluids.  CT of the head was negative for acute intracranial abnormality.  Patient refused surgical intervention.  General surgery has signed off. Palliative care was consulted.  She was switched to full comfort measures on 12/30/2021. ? ?Assessment & Plan: ?  ?Principal Problem: ?  SBO (small bowel obstruction) ([place]) ?Active Problems: ?  Hyperlipidemia ?  Protein-calorie malnutrition, severe ([place]) ?  Hypokalemia ?  PNA (pneumonia) ?  Acute metabolic encephalopathy ?  Dementia without behavioral disturbance ([place]) ?  COVID-19 virus infection ?  Lactic acidosis ?  Comfort measures only status ? ?She was switched to full comfort measures on 12/30/2021.  Family interested in residential hospice at [place].  Today is day #11 of isolation.  Isolation can be discontinued from today onwards. There is no bed available today,  Hopefully, she can be discharged [facility] tomorrow. ?  ? ?DVT prophylaxis: Comfort care ?Code Status: DNR ?Family Communication: No family at bed side. ?Disposition Plan:  ?Status is: Inpatient ?Remains inpatient appropriate because: Admitted for small bowel obstruction,  refused surgery , now care transitioned to comfort care.  [facility] tomorrow, [facility] bed [facility]. ?  ? ?Consultants:  ?Palliative care ?General  surgery ? ?Procedures: None ? ?Antimicrobials: ?Anti-infectives (From admission, onward)  ? ? Start     Dose/Rate Route Frequency Ordered Stop  ? 12/28/21 0600  metroNIDAZOLE (FLAGYL) IVPB 500 mg  Status:  Discontinued       ? 500 mg ?100 mL/hr over 60 Minutes Intravenous Every 12 hours 12/27/21 1943 12/28/21 1140  ? 12/28/21 0600  ceFEPIme (MAXIPIME) 2 g in sodium chloride 0.9 % 100 mL IVPB  Status:  Discontinued       ? 2 g ?200 mL/hr over 30 Minutes Intravenous Every 12 hours 12/27/21 1815 12/30/21 0917  ? 12/27/21 1645  metroNIDAZOLE (FLAGYL) IVPB 500 mg  Status:  Discontinued       ? 500 mg ?100 mL/hr over 60 Minutes Intravenous Every 12 hours 12/27/21 1640 12/27/21 1951  ? 12/27/21 1645  vancomycin (VANCOCIN) IVPB 1000 mg/200 mL premix       ? 1,000 mg ?200 mL/hr over 60 Minutes Intravenous  Once 12/27/21 1642 12/27/21 1824  ? 12/27/21 1645  ceFEPIme (MAXIPIME) 2 g in sodium chloride 0.9 % 100 mL IVPB       ? 2 g ?200 mL/hr over 30 Minutes Intravenous  Once 12/27/21 1642 12/27/21 1823  ? ?  ?  ? ?Subjective: ?Patient was seen and examined at bedside.  Overnight events noted.   ?Patient reports having back pain which is reasonably controlled. ?She appears comfortable otherwise. ? ?Objective: ?Vitals:  ? 01/05/22 1128 01/05/22 1927 01/06/22 1243 01/06/22 1937  ?BP: 111/69 114/84 106/64 115/79  ?Pulse: (!) 116 (!) 115 66 (!) 121  ?Resp: '20 20 12 '$ (!) 24  ?Temp: 98.7 ?F (  37.1 ?C) 97.9 ?F (36.6 ?C) 98.8 ?F (37.1 ?C)   ?TempSrc: Oral Oral Oral   ?SpO2: 97% 97% 96% 97%  ?Weight:      ?Height:      ? ? ?Intake/Output Summary (Last 24 hours) at 01/07/2022 1036 ?Last data filed at 01/07/2022 5320 ?Gross per 24 hour  ?Intake 355 ml  ?Output 250 ml  ?Net 105 ml  ? ?Filed Weights  ? 12/27/21 1852  ?Weight: 38 kg  ? ? ?Examination: ? ?General exam: Chronically ill looking and deconditioned.  Appears comfortable and not in any acute distress. ?Respiratory system: CTA bilaterally, No wheezing, No crackles, respiratory effort  normal. ?Cardiovascular system: S1 & S2 heard, regular rate and rhythm, no murmur. ?Gastrointestinal system: Abdomen is soft, non tender, non distended BS+ ?Central nervous system: Alert and oriented x 2. No focal neurological deficits. ?Extremities: No edema, no cyanosis, no clubbing. ?Skin: No rashes, lesions or ulcers ?Psychiatry: Judgement and insight appear normal. Mood & affect appropriate.  ? ? ? ?Data Reviewed: I have personally reviewed following labs and imaging studies ? ?CBC: ?No results for input(s): WBC, NEUTROABS, HGB, HCT, MCV, PLT in the last 168 hours. ?Basic Metabolic Panel: ?No results for input(s): NA, K, CL, CO2, GLUCOSE, BUN, CREATININE, CALCIUM, MG, PHOS in the last 168 hours. ?GFR: ?Estimated Creatinine Clearance: 34.2 mL/min (by C-G formula based on SCr of 0.61 mg/dL). ?Liver Function Tests: ?No results for input(s): AST, ALT, ALKPHOS, BILITOT, PROT, ALBUMIN in the last 168 hours. ?No results for input(s): LIPASE, AMYLASE in the last 168 hours. ?No results for input(s): AMMONIA in the last 168 hours. ?Coagulation Profile: ?No results for input(s): INR, PROTIME in the last 168 hours. ?Cardiac Enzymes: ?No results for input(s): CKTOTAL, CKMB, CKMBINDEX, TROPONINI in the last 168 hours. ?BNP (last 3 results) ?No results for input(s): PROBNP in the last 8760 hours. ?HbA1C: ?No results for input(s): HGBA1C in the last 72 hours. ?CBG: ?No results for input(s): GLUCAP in the last 168 hours. ?Lipid Profile: ?No results for input(s): CHOL, HDL, LDLCALC, TRIG, CHOLHDL, LDLDIRECT in the last 72 hours. ?Thyroid Function Tests: ?No results for input(s): TSH, T4TOTAL, FREET4, T3FREE, THYROIDAB in the last 72 hours. ?Anemia Panel: ?No results for input(s): VITAMINB12, FOLATE, FERRITIN, TIBC, IRON, RETICCTPCT in the last 72 hours. ?Sepsis Labs: ?No results for input(s): PROCALCITON, LATICACIDVEN in the last 168 hours. ? ?No results found for this or any previous visit (from the past 240 hour(s)). ?   ?Radiology Studies: ?No results found. ? ?Scheduled Meds: ? mouth rinse  15 mL Mouth Rinse BID  ?  morphine injection  2 mg Intravenous Q4H  ? ?Continuous Infusions: ? ? LOS: 11 days  ? ? ?Time spent: 35 mins ? ? ? ?Shawna Clamp, MD ?[facility] ? ? ?If 7PM-7AM, please contact night-coverage  ?

## 2022-01-08 DIAGNOSIS — K56609 Unspecified intestinal obstruction, unspecified as to partial versus complete obstruction: Secondary | ICD-10-CM | POA: Diagnosis not present

## 2022-01-08 MED ORDER — ONDANSETRON HCL 4 MG PO TABS: 4.0000 mg | ORAL_TABLET | Freq: Four times a day (QID) | ORAL | 0 refills | Status: DC | PRN

## 2022-01-08 NOTE — TOC Transition Note (Signed)
Transition of Care (TOC) - CM/SW Discharge Note ? ? ?Patient Details  ?Name: Shelly Barrett ?MRN: [id number] ?Date of Birth: 1941-05-04 ? ?Transition of Care (TOC) CM/SW Contact:  ?Trish Mage, LCSW ?Phone Number: ?01/08/2022, 10:22 AM ? ? ?Clinical Narrative:   Patient will transfer to [facility] today. Family alerted. PTAR arranged.  Nursing, please call report to [phone]. TOC sign off. ? ? ? ?Final next level of care: [place] ?Barriers to Discharge: Barriers Resolved ? ? ?Patient Goals and CMS Choice ?  ?  ?  ? ?Discharge Placement ?  ?           ?  ?  ?  ?  ? ?Discharge Plan and Services ?  ?  ?           ?  ?  ?  ?  ?  ?  ?  ?  ?  ?  ? ?Social Determinants of Health (SDOH) Interventions ?  ? ? ?Readmission Risk Interventions ?   ? View : No data to display.  ?  ?  ?  ? ? ? ? ? ?

## 2022-01-08 NOTE — Discharge Instructions (Signed)
Patient is being discharged to [place] for comfort care/hospice ?

## 2022-01-08 NOTE — Progress Notes (Signed)
[facility]) Hospital Liaison Note ? ?Bed offered and accepted for transfer today to [facility]. Unit RN please call report to [phone] prior to patient leaving the unit. Please send signed DNR and paperwork with patient.  ? ?Please call with any questions or concerns. Thank you ? ?Shanita Wicker, LCSW ?[facility] Hospital Liaison ?[phone] ?

## 2022-01-08 NOTE — Progress Notes (Signed)
Pt being discharged to [facility] via [place]. RN called report to Therapist, sports at [facility]. RN informed to leave in pts peripheral IV in her left antecubital. Pt being discharged with foley in place. Discharge instructions and medication education placed in discharge packet for receiving facility.  ?

## 2022-01-08 NOTE — Discharge Summary (Signed)
Physician Discharge Summary  ?Shelly Barrett JJH:[id number] DOB: October 06, 1941 DOA: 12/27/2021 ? ?PCP: [name], MD ? ?Admit date: 12/27/2021 ? ?Discharge date: 01/08/2022 ? ?Admitted From: Home ? ?Disposition:  [facility]. ? ?Recommendations for Outpatient Follow-up:  ?Patient is being discharged to [facility] for hospice care. ?Comfort measures ? ?Home Health: Hospice  ?Equipment/Devices:Oxygen @ 2L/m ? ?Discharge Condition: Fair ?CODE STATUS:DNR ?Diet recommendation: Dysphagia III diet ? ?Brief Summary / Hospital course: ?This 81 y.o. female with medical history significant of dementia, HTN, scoliosis presented with altered mental status and vomiting.  On presentation, she was found to have high-grade small bowel obstruction at right obturator hernia containing a loop of small bowel with moderate to severe distention of the stomach and proximal and mid small bowel loops, no evidence of pneumoperitoneum with possible right lower lobe pneumonia or aspiration.  NG tube was placed,  General surgery was consulted.  She was started on IV fluids.  CT of the head was negative for acute intracranial abnormality.  Patient refused surgical intervention.  General surgery has signed off. Palliative care was consulted.  She was switched to full comfort measures on 12/30/2021. ?Family interested in residential hospice at [facility].  Today is day #13 of isolation.  She is off iso;ation precautions.  Patient is being discharged to [facility] for comfort care, hospice. ? ? ?Discharge Diagnoses:  ?Principal Problem: ?  SBO (small bowel obstruction) ([place]) ?Active Problems: ?  Hyperlipidemia ?  Protein-calorie malnutrition, severe ([place]) ?  Hypokalemia ?  PNA (pneumonia) ?  Acute metabolic encephalopathy ?  Dementia without behavioral disturbance ([place]) ?  COVID-19 virus infection ?  Lactic acidosis ?  Comfort measures only status ? ? ?Discharge Instructions ? ?Discharge Instructions   ? ? Call MD for:  persistant dizziness or  light-headedness   Complete by: As directed ?  ? Call MD for:  persistant nausea and vomiting   Complete by: As directed ?  ? Call MD for:  severe uncontrolled pain   Complete by: As directed ?  ? Diet - low sodium heart healthy   Complete by: As directed ?  ? Diet clear liquid   Complete by: As directed ?  ? Discharge instructions   Complete by: As directed ?  ? Patient is being discharged to [facility] for comfort care/hospice  ? Increase activity slowly   Complete by: As directed ?  ? No wound care   Complete by: As directed ?  ? ?  ? ?Allergies as of 01/08/2022   ? ?   Reactions  ? Betadine [povidone Iodine]   ? Ciprocinonide [fluocinolone]   ? Ciprofloxacin Hcl   ? Hives  ? Flexeril [cyclobenzaprine]   ? Lactose Intolerance (gi)   ? Diarrhea, cramping  ? Macrobid [nitrofurantoin Macrocrystal] Diarrhea, Nausea And Vomiting, Swelling  ? Naprosyn [naproxen]   ? Penicillins   ? REACTION: rash ?Documentation from [employer] in 2012 patient was on zosyn then on keflex without known adverse reaction  ? Shellfish Allergy   ? Sodium Benzoate [nutritional Supplements]   ? Iodinated Contrast Media Rash  ? ?  ? ?  ?Medication List  ?  ? ?STOP taking these medications   ? ?amitriptyline 10 MG tablet ?Commonly known as: ELAVIL ?  ?cholecalciferol 1000 units tablet ?Commonly known as: VITAMIN D ?  ?diclofenac Sodium 1 % Gel ?Commonly known as: Voltaren ?  ?gabapentin 100 MG capsule ?Commonly known as: NEURONTIN ?  ?hydrochlorothiazide 12.5 MG tablet ?Commonly known  as: HYDRODIURIL ?  ?lactase 3000 units tablet ?Commonly known as: LACTAID ?  ?multivitamin tablet ?  ?Prelief 340 (65-50) MG (CA-P) Tabs ?Generic drug: Calcium Glycerophosphate ?  ? ?  ? ?TAKE these medications   ? ?ondansetron 4 MG tablet ?Commonly known as: ZOFRAN ?Take 1 tablet (4 mg total) by mouth every 6 (six) hours as needed for nausea. ?  ? ?  ? ? Follow-up Information   ? ? [name], MD Follow up in 1 month(s).   ?Specialty: Internal  Medicine ?Contact information: ?[address] ?[address] ?[place] [address] ?[phone] ? ? ?  ?  ? ?  ?  ? ?  ? ?Allergies  ?Allergen Reactions  ? Betadine [Povidone Iodine]   ? Ciprocinonide [Fluocinolone]   ? Ciprofloxacin Hcl   ?  Hives  ? Flexeril [Cyclobenzaprine]   ? Lactose Intolerance (Gi)   ?  Diarrhea, cramping  ? Macrobid [Nitrofurantoin Macrocrystal] Diarrhea, Nausea And Vomiting and Swelling  ? Naprosyn [Naproxen]   ? Penicillins   ?  REACTION: rash ?Documentation from [facility] in 2012 patient was on zosyn then on keflex without known adverse reaction  ? Shellfish Allergy   ? Sodium Benzoate [Nutritional Supplements]   ? Iodinated Contrast Media Rash  ? ? ?Consultations: ?Palliative care ?General surgery ? ? ?Procedures/Studies: ?CT ABDOMEN PELVIS WO CONTRAST ? ?Result Date: 12/27/2021 ?CLINICAL DATA:  81 year old female with chest, abdominal and pelvic pain, shortness of breath and vomiting. EXAM: CT CHEST, ABDOMEN AND PELVIS WITHOUT CONTRAST TECHNIQUE: Multidetector CT imaging of the chest, abdomen and pelvis was performed following the standard protocol without IV contrast. RADIATION DOSE REDUCTION: This exam was performed according to the departmental dose-optimization program which includes automated exposure control, adjustment of the mA and/or kV according to patient size and/or use of iterative reconstruction technique. COMPARISON:  12/27/2021 and prior radiographs. 12/13/2017 abdominal and pelvic CT. FINDINGS: Please note that parenchymal and vascular abnormalities may be missed as intravenous contrast was not administered. CT CHEST FINDINGS Cardiovascular: Heart size is normal. Coronary artery and aortic atherosclerotic calcifications are present. No thoracic aortic aneurysm or pericardial effusion identified. Mediastinum/Nodes: The esophagus is distended. No mediastinal mass or enlarged lymph nodes identified. The visualized thyroid gland is unremarkable. Lungs/Pleura: RIGHT  LOWER lobe airspace disease likely represents pneumonia or aspiration. Bibasilar atelectasis is present. Trace pleural effusions are noted. There is no evidence of pneumothorax. Musculoskeletal: Severe thoracic kyphosis noted. Remote appearing compression fractures of C7, T3, T5, T7, T8 and T12 are noted. No definite acute bony abnormality noted. CT ABDOMEN PELVIS FINDINGS Hepatobiliary: The liver is unremarkable. What appears to be the gallbladder is unremarkable. There is no evidence of intrahepatic or extrahepatic biliary dilatation. Pancreas: Not well visualized but no definite abnormality. Spleen: Unremarkable Adrenals/Urinary Tract: Nonobstructing RIGHT LOWER pole renal calculus noted. A LEFT renal cyst is identified. No acute abnormalities are noted. The visualized portions of the adrenal glands and bladder are unremarkable. Stomach/Bowel: There is moderate to severe distension of the stomach and proximal mid small bowel loops with high-grade obstruction point at a RIGHT obturator hernia containing a loop of small bowel. Collapsed distal small bowel loops are noted. Some gas and stool in the colon is identified. A small LEFT obturator hernia is also noted. There is no evidence of pneumoperitoneum. Vascular/Lymphatic: Aortic atherosclerosis. No enlarged abdominal or pelvic lymph nodes. Reproductive: No definite abnormality. Other: No ascites. Musculoskeletal: Degenerative changes in the lumbar spine are noted. Remote appearing fractures of L1, L5 and INFERIOR  LEFT pubic ramus noted. No definite acute bony abnormality. IMPRESSION: 1. High-grade small bowel obstruction at a RIGHT obturator hernia containing a loop of small bowel with moderate to severe distension of the stomach and proximal-mid small bowel loops. No evidence of pneumoperitoneum. 2. RIGHT LOWER lobe airspace disease likely representing pneumonia or aspiration. Trace pleural effusions and bibasilar atelectasis. 3. Remote appearing compression  fractures of the cervical, thoracic, lumbar spine, and pelvis. Correlate clinically. 4. RIGHT nephrolithiasis. 5. Coronary artery disease. 6. Aortic Atherosclerosis (ICD10-I70.0). Electronically Signed   By: Cleatis Polka

## 2022-02-08 DEATH — deceased

## 2022-05-12 ENCOUNTER — Encounter: Payer: PPO | Admitting: Adult Health
